# Patient Record
Sex: Female | Born: 1950 | Race: White | Hispanic: No | Marital: Single | Smoking: Never smoker
Health system: Southern US, Community
[De-identification: ages and names within clinical notes are randomized; demographics above are authoritative.]

## PROBLEM LIST (undated history)

## (undated) DIAGNOSIS — IMO0002 Reserved for concepts with insufficient information to code with codable children: Secondary | ICD-10-CM

## (undated) DIAGNOSIS — J019 Acute sinusitis, unspecified: Secondary | ICD-10-CM

## (undated) DIAGNOSIS — M549 Dorsalgia, unspecified: Secondary | ICD-10-CM

## (undated) DIAGNOSIS — R110 Nausea: Secondary | ICD-10-CM

## (undated) DIAGNOSIS — M25572 Pain in left ankle and joints of left foot: Principal | ICD-10-CM

## (undated) DIAGNOSIS — I10 Essential (primary) hypertension: Secondary | ICD-10-CM

## (undated) DIAGNOSIS — M898X9 Other specified disorders of bone, unspecified site: Principal | ICD-10-CM

## (undated) DIAGNOSIS — E785 Hyperlipidemia, unspecified: Secondary | ICD-10-CM

## (undated) DIAGNOSIS — R5382 Chronic fatigue, unspecified: Secondary | ICD-10-CM

## (undated) DIAGNOSIS — R609 Edema, unspecified: Secondary | ICD-10-CM

## (undated) DIAGNOSIS — N189 Chronic kidney disease, unspecified: Secondary | ICD-10-CM

## (undated) DIAGNOSIS — R300 Dysuria: Secondary | ICD-10-CM

## (undated) HISTORY — DX: Dysuria: R30.0

## (undated) HISTORY — DX: Hyperlipidemia, unspecified: E78.5

## (undated) HISTORY — PX: GANGLION CYST EXCISION: SHX1691

## (undated) HISTORY — PX: LAPAROSCOPIC OVARIAN CYSTECTOMY: SUR786

## (undated) HISTORY — DX: Edema, unspecified: R60.9

## (undated) HISTORY — PX: COLONOSCOPY W/ POLYPECTOMY: SHX1380

## (undated) HISTORY — DX: Other specified disorders of bone, unspecified site: M89.8X9

## (undated) HISTORY — DX: Pain in left ankle and joints of left foot: M25.572

## (undated) HISTORY — DX: Chronic kidney disease, unspecified: N18.9

## (undated) HISTORY — DX: Dorsalgia, unspecified: M54.9

## (undated) HISTORY — DX: Reserved for concepts with insufficient information to code with codable children: IMO0002

## (undated) HISTORY — DX: Acute sinusitis, unspecified: J01.90

## (undated) HISTORY — DX: Essential (primary) hypertension: I10

## (undated) HISTORY — PX: LIPOMA EXCISION: SHX5283

## (undated) HISTORY — DX: Chronic fatigue, unspecified: R53.82

## (undated) HISTORY — DX: Nausea: R11.0

## (undated) HISTORY — PX: ABLATION ON ENDOMETRIOSIS: SHX5787

---

## 1998-05-13 ENCOUNTER — Other Ambulatory Visit: Admission: RE | Admit: 1998-05-13 | Discharge: 1998-05-13 | Payer: Self-pay | Admitting: Obstetrics and Gynecology

## 2001-05-17 ENCOUNTER — Ambulatory Visit (HOSPITAL_COMMUNITY): Admission: RE | Admit: 2001-05-17 | Discharge: 2001-05-17 | Payer: Self-pay | Admitting: Gastroenterology

## 2001-06-14 ENCOUNTER — Other Ambulatory Visit: Admission: RE | Admit: 2001-06-14 | Discharge: 2001-06-14 | Payer: Self-pay | Admitting: Orthopedic Surgery

## 2002-07-31 ENCOUNTER — Emergency Department (HOSPITAL_COMMUNITY): Admission: EM | Admit: 2002-07-31 | Discharge: 2002-08-01 | Payer: Self-pay | Admitting: Emergency Medicine

## 2002-08-01 ENCOUNTER — Encounter: Payer: Self-pay | Admitting: Emergency Medicine

## 2002-10-02 ENCOUNTER — Encounter: Admission: RE | Admit: 2002-10-02 | Discharge: 2002-10-02 | Payer: Self-pay | Admitting: Family Medicine

## 2002-10-02 ENCOUNTER — Encounter: Payer: Self-pay | Admitting: Family Medicine

## 2002-12-18 ENCOUNTER — Encounter: Admission: RE | Admit: 2002-12-18 | Discharge: 2002-12-18 | Payer: Self-pay

## 2003-01-30 ENCOUNTER — Encounter (INDEPENDENT_AMBULATORY_CARE_PROVIDER_SITE_OTHER): Payer: Self-pay

## 2003-01-30 ENCOUNTER — Ambulatory Visit (HOSPITAL_COMMUNITY): Admission: RE | Admit: 2003-01-30 | Discharge: 2003-01-30 | Payer: Self-pay

## 2003-05-08 ENCOUNTER — Encounter: Admission: RE | Admit: 2003-05-08 | Discharge: 2003-05-08 | Payer: Self-pay

## 2003-09-07 ENCOUNTER — Encounter: Payer: Self-pay | Admitting: Family Medicine

## 2003-09-07 ENCOUNTER — Encounter: Admission: RE | Admit: 2003-09-07 | Discharge: 2003-09-07 | Payer: Self-pay | Admitting: Family Medicine

## 2005-05-16 ENCOUNTER — Other Ambulatory Visit: Admission: RE | Admit: 2005-05-16 | Discharge: 2005-05-16 | Payer: Self-pay | Admitting: Family Medicine

## 2006-07-26 ENCOUNTER — Other Ambulatory Visit: Admission: RE | Admit: 2006-07-26 | Discharge: 2006-07-26 | Payer: Self-pay | Admitting: Family Medicine

## 2007-10-15 ENCOUNTER — Encounter: Admission: RE | Admit: 2007-10-15 | Discharge: 2007-10-15 | Payer: Self-pay | Admitting: Allergy and Immunology

## 2008-04-29 ENCOUNTER — Other Ambulatory Visit: Admission: RE | Admit: 2008-04-29 | Discharge: 2008-04-29 | Payer: Self-pay | Admitting: Obstetrics and Gynecology

## 2008-08-12 ENCOUNTER — Encounter: Admission: RE | Admit: 2008-08-12 | Discharge: 2008-08-12 | Payer: Self-pay | Admitting: Otolaryngology

## 2009-09-29 ENCOUNTER — Encounter: Admission: RE | Admit: 2009-09-29 | Discharge: 2009-09-29 | Payer: Self-pay | Admitting: Family Medicine

## 2011-01-26 ENCOUNTER — Other Ambulatory Visit: Payer: Self-pay | Admitting: Dermatology

## 2011-04-21 NOTE — Op Note (Signed)
Marissa Parks, Marissa Parks                          ACCOUNT NO.:  [id number]   MEDICAL RECORD NO.:  [id number]                   PATIENT TYPE:  AMB   LOCATION:  SDC                                  FACILITY:  [facility]   PHYSICIAN:  Ronda Fairly. Galen Daft, M.D.              DATE OF BIRTH:  15-Jul-1951   DATE OF PROCEDURE:  01/30/2003  DATE OF DISCHARGE:                                 OPERATIVE REPORT   PREOPERATIVE DIAGNOSIS:  Bilateral ovarian neoplasm, uncertain behavior,  uncertain etiology.   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   PROCEDURE:  Resection of portions of left and right ovary with removal of  endometriomas via laparoscopy.   SURGEON:  Ronda Fairly. Galen Daft, M.D.   ANESTHESIA:  General anesthesia.   COMPLICATIONS:  None.   ESTIMATED BLOOD LOSS:  Less than 10 cc.   SPECIMENS:  Portions of left and right ovarian tissue.   DESCRIPTION OF PROCEDURE:  The patient was identified and informed consent  had been obtained and we reviewed this prior to the surgery.  We reviewed  her preoperative labs and everything was okay. She went to the operating  room. General anesthesia, Betadine prep, sterile technique, preoperative  antibiotics, sequential compression boots.  The umbilical incision was  utilized for the first trocar after inflation with carbon dioxide gas  through a Veress needle. Care was taken to ascertain correct placement of  the Veress needle.  A 5 mm trocar was used in the umbilical site and a  secondary 5 mm site was utilized and a tertiary 5 mm site was utilized under  direct visualization in the left and right flank, respectively. These  allowed for complete inspection of the ovaries and surrounding tissues.  The  upper abdomen was unremarkable.  The liver was free of adhesions.  There  were no significant bowel adhesions.  The lower pelvis showed evidence of  endometriosis on the ovaries and there was a chocolate  filled cyst on both  right and left ovary.  These were removed along with ovarian tissue.  The  ovaries were left in place otherwise.  The tubes were left in place.  There  was no significant pelvic endometriosis.  The cul-de-sac was not  obliterated, etc.  There was some scarring in the cul-de-sac but very  minimal.  The anterior cul-de-sac similarly was unremarkable.  There were  some small uterine fibroids which were incidental.  The areas were  completely hemostatic after the portions were removed and cautery was  utilized where necessary.  These areas were removed with a combination of  biopsy forceps, sharp scissor dissection with electrocautery and bipolar  cautery. Care was taken to avoid all adjacent structures.  There was no  injury.  The tissues were all hemostatic.  The carbon dioxide was deflated.  Again, inspection was shown to be complete for hemostasis.  All sponge,  needle and instrument counts were correct after the instruments were removed  and the skin was closed with 3-0 Monocryl closure.  No complications.  The  patient tolerated the procedure well and left the operating room in stable  condition.                                               Ronda Fairly. Galen Daft, M.D.   NJT/MEDQ  D:  01/30/2003  T:  01/30/2003  Job:  [id number]

## 2011-12-13 ENCOUNTER — Other Ambulatory Visit: Payer: Self-pay | Admitting: Family Medicine

## 2011-12-13 DIAGNOSIS — R9389 Abnormal findings on diagnostic imaging of other specified body structures: Secondary | ICD-10-CM

## 2011-12-13 DIAGNOSIS — R0789 Other chest pain: Secondary | ICD-10-CM

## 2011-12-15 ENCOUNTER — Ambulatory Visit
Admission: RE | Admit: 2011-12-15 | Discharge: 2011-12-15 | Disposition: A | Discharge status: Home / Self Care | Payer: BC Managed Care – PPO | Source: Ambulatory Visit | Attending: Family Medicine | Admitting: Family Medicine

## 2011-12-15 DIAGNOSIS — R0789 Other chest pain: Secondary | ICD-10-CM

## 2011-12-15 DIAGNOSIS — R9389 Abnormal findings on diagnostic imaging of other specified body structures: Secondary | ICD-10-CM

## 2013-02-04 ENCOUNTER — Other Ambulatory Visit: Payer: Self-pay | Admitting: Family Medicine

## 2013-02-04 DIAGNOSIS — R109 Unspecified abdominal pain: Secondary | ICD-10-CM

## 2013-02-05 ENCOUNTER — Ambulatory Visit
Admission: RE | Admit: 2013-02-05 | Discharge: 2013-02-05 | Disposition: A | Discharge status: Home / Self Care | Payer: BC Managed Care – PPO | Source: Ambulatory Visit | Attending: Family Medicine | Admitting: Family Medicine

## 2013-02-05 DIAGNOSIS — R109 Unspecified abdominal pain: Secondary | ICD-10-CM

## 2013-02-06 ENCOUNTER — Other Ambulatory Visit (HOSPITAL_COMMUNITY): Payer: Self-pay | Admitting: Family Medicine

## 2013-02-06 DIAGNOSIS — R109 Unspecified abdominal pain: Secondary | ICD-10-CM

## 2013-02-18 ENCOUNTER — Encounter (HOSPITAL_COMMUNITY)
Admission: RE | Admit: 2013-02-18 | Discharge: 2013-02-18 | Disposition: A | Discharge status: Home / Self Care | Payer: BC Managed Care – PPO | Source: Ambulatory Visit | Attending: Family Medicine | Admitting: Family Medicine

## 2013-02-18 DIAGNOSIS — R109 Unspecified abdominal pain: Secondary | ICD-10-CM | POA: Insufficient documentation

## 2013-02-18 MED ORDER — TECHNETIUM TC 99M MEBROFENIN IV KIT
5.0000 | PACK | Freq: Once | INTRAVENOUS | Status: AC | PRN
  Administered 2013-02-18: 5 via INTRAVENOUS

## 2014-02-11 ENCOUNTER — Other Ambulatory Visit: Payer: Self-pay | Admitting: Family Medicine

## 2014-02-11 DIAGNOSIS — R935 Abnormal findings on diagnostic imaging of other abdominal regions, including retroperitoneum: Secondary | ICD-10-CM

## 2014-02-12 ENCOUNTER — Ambulatory Visit
Admission: RE | Admit: 2014-02-12 | Discharge: 2014-02-12 | Disposition: A | Discharge status: Home / Self Care | Payer: BC Managed Care – PPO | Source: Ambulatory Visit | Attending: Family Medicine | Admitting: Family Medicine

## 2014-02-12 DIAGNOSIS — R935 Abnormal findings on diagnostic imaging of other abdominal regions, including retroperitoneum: Secondary | ICD-10-CM

## 2014-05-07 ENCOUNTER — Ambulatory Visit (INDEPENDENT_AMBULATORY_CARE_PROVIDER_SITE_OTHER): Payer: BC Managed Care – PPO | Admitting: Podiatry

## 2014-05-07 ENCOUNTER — Encounter: Payer: Self-pay | Admitting: Podiatry

## 2014-05-07 VITALS — BP 122/69 | HR 91 | Resp 16

## 2014-05-07 DIAGNOSIS — L84 Corns and callosities: Secondary | ICD-10-CM

## 2014-05-08 NOTE — Progress Notes (Signed)
Subjective:     Patient ID: Marissa Parks, female   DOB: 1951-05-30, 63 y.o.   MRN: [id number]  HPI patient is found to have callus formation both feet it's painful   Review of Systems     Objective:   Physical Exam Neurovascular status intact with keratotic lesions plantar aspect both feet that are painful when pressed    Assessment:     Callus formation with porokeratosis probably part of the problem    Plan:     Debridement painful lesions both feet with no bleeding noted

## 2014-06-04 ENCOUNTER — Other Ambulatory Visit: Payer: Self-pay | Admitting: Family Medicine

## 2014-06-04 ENCOUNTER — Ambulatory Visit
Admission: RE | Admit: 2014-06-04 | Discharge: 2014-06-04 | Disposition: A | Discharge status: Home / Self Care | Payer: BC Managed Care – PPO | Source: Ambulatory Visit | Attending: Family Medicine | Admitting: Family Medicine

## 2014-06-04 DIAGNOSIS — M5489 Other dorsalgia: Secondary | ICD-10-CM

## 2014-06-04 DIAGNOSIS — R0781 Pleurodynia: Secondary | ICD-10-CM

## 2014-06-30 ENCOUNTER — Other Ambulatory Visit: Payer: Self-pay | Admitting: Neurosurgery

## 2014-06-30 DIAGNOSIS — IMO0002 Reserved for concepts with insufficient information to code with codable children: Secondary | ICD-10-CM

## 2014-07-07 ENCOUNTER — Telehealth: Payer: Self-pay | Admitting: Hematology and Oncology

## 2014-07-07 NOTE — Telephone Encounter (Signed)
S/W PATIENT AND GAVE NP APPT FOR 08/05 @ 8:15 W/DR. [place].

## 2014-07-08 ENCOUNTER — Encounter: Payer: Self-pay | Admitting: Hematology and Oncology

## 2014-07-08 ENCOUNTER — Ambulatory Visit: Payer: BC Managed Care – PPO

## 2014-07-08 ENCOUNTER — Inpatient Hospital Stay (HOSPITAL_COMMUNITY)
Admission: AD | Admit: 2014-07-08 | Discharge: 2014-07-10 | DRG: 841 | Disposition: A | Discharge status: Home / Self Care | Payer: BC Managed Care – PPO | Source: Ambulatory Visit | Attending: Hematology and Oncology | Admitting: Hematology and Oncology

## 2014-07-08 ENCOUNTER — Encounter: Payer: BC Managed Care – PPO | Admitting: Hematology and Oncology

## 2014-07-08 ENCOUNTER — Encounter (HOSPITAL_COMMUNITY): Payer: Self-pay

## 2014-07-08 ENCOUNTER — Encounter (INDEPENDENT_AMBULATORY_CARE_PROVIDER_SITE_OTHER): Payer: Self-pay

## 2014-07-08 DIAGNOSIS — Z801 Family history of malignant neoplasm of trachea, bronchus and lung: Secondary | ICD-10-CM

## 2014-07-08 DIAGNOSIS — M8448XA Pathological fracture, other site, initial encounter for fracture: Secondary | ICD-10-CM | POA: Diagnosis present

## 2014-07-08 DIAGNOSIS — N189 Chronic kidney disease, unspecified: Secondary | ICD-10-CM | POA: Diagnosis present

## 2014-07-08 DIAGNOSIS — E860 Dehydration: Secondary | ICD-10-CM

## 2014-07-08 DIAGNOSIS — C9000 Multiple myeloma not having achieved remission: Principal | ICD-10-CM | POA: Diagnosis present

## 2014-07-08 DIAGNOSIS — R319 Hematuria, unspecified: Secondary | ICD-10-CM | POA: Diagnosis present

## 2014-07-08 DIAGNOSIS — R634 Abnormal weight loss: Secondary | ICD-10-CM | POA: Diagnosis present

## 2014-07-08 DIAGNOSIS — IMO0002 Reserved for concepts with insufficient information to code with codable children: Secondary | ICD-10-CM

## 2014-07-08 DIAGNOSIS — N179 Acute kidney failure, unspecified: Secondary | ICD-10-CM | POA: Diagnosis present

## 2014-07-08 DIAGNOSIS — K5900 Constipation, unspecified: Secondary | ICD-10-CM | POA: Diagnosis present

## 2014-07-08 DIAGNOSIS — D630 Anemia in neoplastic disease: Secondary | ICD-10-CM | POA: Diagnosis present

## 2014-07-08 DIAGNOSIS — M898X9 Other specified disorders of bone, unspecified site: Secondary | ICD-10-CM

## 2014-07-08 DIAGNOSIS — E785 Hyperlipidemia, unspecified: Secondary | ICD-10-CM | POA: Diagnosis present

## 2014-07-08 DIAGNOSIS — I129 Hypertensive chronic kidney disease with stage 1 through stage 4 chronic kidney disease, or unspecified chronic kidney disease: Secondary | ICD-10-CM | POA: Diagnosis present

## 2014-07-08 DIAGNOSIS — N3001 Acute cystitis with hematuria: Secondary | ICD-10-CM

## 2014-07-08 LAB — CBC WITH DIFFERENTIAL/PLATELET
BASOS PCT: 1 % (ref 0–1)
Basophils Absolute: 0.1 10*3/uL (ref 0.0–0.1)
EOS ABS: 0.1 10*3/uL (ref 0.0–0.7)
Eosinophils Relative: 1 % (ref 0–5)
HCT: 25 % — ABNORMAL LOW (ref 36.0–46.0)
HEMOGLOBIN: 8.3 g/dL — AB (ref 12.0–15.0)
LYMPHS PCT: 17 % (ref 12–46)
Lymphs Abs: 0.9 10*3/uL (ref 0.7–4.0)
MCH: 32 pg (ref 26.0–34.0)
MCHC: 33.2 g/dL (ref 30.0–36.0)
MCV: 96.5 fL (ref 78.0–100.0)
MONOS PCT: 11 % (ref 3–12)
Monocytes Absolute: 0.6 10*3/uL (ref 0.1–1.0)
NEUTROS PCT: 70 % (ref 43–77)
Neutro Abs: 3.8 10*3/uL (ref 1.7–7.7)
Platelets: 199 10*3/uL (ref 150–400)
RBC: 2.59 MIL/uL — AB (ref 3.87–5.11)
RDW: 13.1 % (ref 11.5–15.5)
WBC: 5.5 10*3/uL (ref 4.0–10.5)

## 2014-07-08 LAB — COMPREHENSIVE METABOLIC PANEL
ALT: 16 U/L (ref 0–35)
ANION GAP: 17 — AB (ref 5–15)
AST: 17 U/L (ref 0–37)
Albumin: 3.6 g/dL (ref 3.5–5.2)
Alkaline Phosphatase: 86 U/L (ref 39–117)
BILIRUBIN TOTAL: 0.2 mg/dL — AB (ref 0.3–1.2)
BUN: 33 mg/dL — AB (ref 6–23)
CALCIUM: 13.1 mg/dL — AB (ref 8.4–10.5)
CO2: 24 mEq/L (ref 19–32)
CREATININE: 2.83 mg/dL — AB (ref 0.50–1.10)
Chloride: 97 mEq/L (ref 96–112)
GFR calc Af Amer: 20 mL/min — ABNORMAL LOW (ref >90)
GFR calc non Af Amer: 17 mL/min — ABNORMAL LOW (ref >90)
Glucose, Bld: 93 mg/dL (ref 70–99)
Potassium: 3.5 mEq/L — ABNORMAL LOW (ref 3.7–5.3)
Sodium: 138 mEq/L (ref 137–147)
TOTAL PROTEIN: 8.8 g/dL — AB (ref 6.0–8.3)

## 2014-07-08 LAB — LACTATE DEHYDROGENASE: LDH: 283 U/L — AB (ref 94–250)

## 2014-07-08 LAB — URIC ACID: Uric Acid, Serum: 10.4 mg/dL — ABNORMAL HIGH (ref 2.4–7.0)

## 2014-07-08 LAB — BONE MARROW EXAM

## 2014-07-08 LAB — PHOSPHORUS: PHOSPHORUS: 2.9 mg/dL (ref 2.3–4.6)

## 2014-07-08 MED ORDER — ACYCLOVIR 400 MG PO TABS
400.0000 mg | ORAL_TABLET | Freq: Every day | ORAL | Status: DC
  Administered 2014-07-08 – 2014-07-10 (×3): 400 mg via ORAL
  Filled 2014-07-08 (×3): qty 1

## 2014-07-08 MED ORDER — DEXAMETHASONE 6 MG PO TABS
40.0000 mg | ORAL_TABLET | Freq: Once | ORAL | Status: AC
  Administered 2014-07-08: 40 mg via ORAL
  Filled 2014-07-08: qty 1

## 2014-07-08 MED ORDER — ONDANSETRON 8 MG/NS 50 ML IVPB
8.0000 mg | Freq: Three times a day (TID) | INTRAVENOUS | Status: DC | PRN
  Filled 2014-07-08: qty 8

## 2014-07-08 MED ORDER — ONDANSETRON HCL 4 MG/2ML IJ SOLN: 4.0000 mg | Freq: Three times a day (TID) | INTRAMUSCULAR | Status: DC | PRN

## 2014-07-08 MED ORDER — BORTEZOMIB CHEMO SQ INJECTION 3.5 MG (2.5MG/ML)
1.3000 mg/m2 | Freq: Once | INTRAMUSCULAR | Status: AC
  Administered 2014-07-08: 1.75 mg via SUBCUTANEOUS
  Filled 2014-07-08: qty 0.7

## 2014-07-08 MED ORDER — SODIUM CHLORIDE 0.9 % IV SOLN
3.0000 mg | Freq: Once | INTRAVENOUS | Status: AC
  Administered 2014-07-08: 3 mg via INTRAVENOUS
  Filled 2014-07-08: qty 2

## 2014-07-08 MED ORDER — ONDANSETRON HCL 4 MG PO TABS: 4.0000 mg | ORAL_TABLET | Freq: Three times a day (TID) | ORAL | Status: DC | PRN

## 2014-07-08 MED ORDER — ALUM & MAG HYDROXIDE-SIMETH 200-200-20 MG/5ML PO SUSP
60.0000 mL | ORAL | Status: DC | PRN
  Administered 2014-07-09: 60 mL via ORAL
  Filled 2014-07-08: qty 60

## 2014-07-08 MED ORDER — HYDROMORPHONE HCL PF 1 MG/ML IJ SOLN
1.0000 mg | INTRAMUSCULAR | Status: DC | PRN
Start: 2014-07-08 — End: 2014-07-10
  Administered 2014-07-08 – 2014-07-10 (×6): 1 mg via INTRAVENOUS
  Filled 2014-07-08 (×7): qty 1

## 2014-07-08 MED ORDER — ONDANSETRON HCL 8 MG PO TABS
8.0000 mg | ORAL_TABLET | Freq: Once | ORAL | Status: AC
  Administered 2014-07-08: 8 mg via ORAL
  Filled 2014-07-08: qty 1

## 2014-07-08 MED ORDER — POLYVINYL ALCOHOL 1.4 % OP SOLN
1.0000 [drp] | Freq: Two times a day (BID) | OPHTHALMIC | Status: DC | PRN
  Filled 2014-07-08: qty 15

## 2014-07-08 MED ORDER — TRAMADOL HCL 50 MG PO TABS
50.0000 mg | ORAL_TABLET | Freq: Two times a day (BID) | ORAL | Status: DC | PRN
  Administered 2014-07-10: 50 mg via ORAL
  Filled 2014-07-08: qty 1

## 2014-07-08 MED ORDER — HEPARIN SODIUM (PORCINE) 5000 UNIT/ML IJ SOLN
5000.0000 [IU] | Freq: Three times a day (TID) | INTRAMUSCULAR | Status: DC
  Administered 2014-07-08 – 2014-07-10 (×6): 5000 [IU] via SUBCUTANEOUS
  Filled 2014-07-08 (×9): qty 1

## 2014-07-08 MED ORDER — PANTOPRAZOLE SODIUM 40 MG PO TBEC
40.0000 mg | DELAYED_RELEASE_TABLET | Freq: Every day | ORAL | Status: DC
  Administered 2014-07-09 – 2014-07-10 (×2): 40 mg via ORAL
  Filled 2014-07-08 (×2): qty 1

## 2014-07-08 MED ORDER — VITAMIN D3 25 MCG (1000 UNIT) PO TABS
2000.0000 [IU] | ORAL_TABLET | Freq: Every morning | ORAL | Status: DC
  Administered 2014-07-09 – 2014-07-10 (×2): 2000 [IU] via ORAL
  Filled 2014-07-08 (×2): qty 2

## 2014-07-08 MED ORDER — SODIUM CHLORIDE 0.9 % IV SOLN
Freq: Once | INTRAVENOUS | Status: AC
  Administered 2014-07-08: 11:00:00 via INTRAVENOUS

## 2014-07-08 MED ORDER — VITAMINS A & D EX OINT
TOPICAL_OINTMENT | CUTANEOUS | Status: AC
  Administered 2014-07-08: 21:00:00
  Filled 2014-07-08: qty 5

## 2014-07-08 MED ORDER — ACETAMINOPHEN 325 MG PO TABS: 650.0000 mg | ORAL_TABLET | ORAL | Status: DC | PRN

## 2014-07-08 MED ORDER — SENNOSIDES-DOCUSATE SODIUM 8.6-50 MG PO TABS
2.0000 | ORAL_TABLET | Freq: Two times a day (BID) | ORAL | Status: DC
  Administered 2014-07-08 – 2014-07-10 (×3): 2 via ORAL
  Filled 2014-07-08 (×5): qty 2

## 2014-07-08 MED ORDER — SODIUM CHLORIDE 0.9 % IV SOLN
INTRAVENOUS | Status: DC
  Administered 2014-07-08 – 2014-07-10 (×4): via INTRAVENOUS

## 2014-07-08 MED ORDER — ONDANSETRON 8 MG PO TBDP: 4.0000 mg | ORAL_TABLET | Freq: Three times a day (TID) | ORAL | Status: DC | PRN

## 2014-07-08 NOTE — Progress Notes (Signed)
Checked in new pt with no financial concerns. °

## 2014-07-08 NOTE — H&P (Signed)
[facility] CONSULT NOTE & Reason fr admission  Patient Care Team: Hulan Fess, MD as PCP - General (Family Medicine)  CHIEF COMPLAINTS/PURPOSE OF CONSULTATION:  This patient was directly admitted from the clinic after presenting with severe hypercalcemia, renal failure, severe anemia, bone pain and diagnosis of multiple myeloma.  HISTORY OF PRESENTING ILLNESS:  Marissa Parks 63 y.o. female was referred by a nephrologist. She presented with worsening renal failure, anemia, and so the vertebral fracture. According to the patient, she has worsening bone pain. She was referred to see neurosurgeon we'll plan for MRI today. Approximately 10 days ago, she was referred to see the nephrologist for renal dysfunction and blood work at that time revealed abnormal serum protein electrophoresis with over 3.5 g of IgA kappa secretion in the urine with serum M spike of 1.9 g. She was noted to be in renal failure with creatinine of 2.2 and severe hypercalcemia. She has severe mid thoracic bone pain. She is taking tramadol. She has severe constipation recently. Denies any nausea. She has very poor appetite and had lost some weight. Patient denies history of recurrent infection or atypical infections such as shingles of meningitis. Denies chills, night sweats, anorexia or abnormal weight loss.  MEDICAL HISTORY:  Past Medical History  Diagnosis Date  . Hypertension   . Chronic kidney disease   . Hyperlipidemia   . Back pain   . Fracture of vertebra     SURGICAL HISTORY: Past Surgical History  Procedure Laterality Date  . Lipoma excision    . Ganglion cyst excision    . Laparoscopic ovarian cystectomy    . Ablation on endometriosis    . Colonoscopy w/ polypectomy      SOCIAL HISTORY: History   Social History  . Marital Status: Single    Spouse Name: N/A    Number of Children: N/A  . Years of Education: N/A   Occupational History  . Not on file.   Social History Main  Topics  . Smoking status: Never Smoker   . Smokeless tobacco: Never Used  . Alcohol Use: No  . Drug Use: Not on file  . Sexual Activity: No   Other Topics Concern  . Not on file   Social History Narrative  . No narrative on file    FAMILY HISTORY: Family History  Problem Relation Age of Onset  . Cancer Maternal Aunt     breast and throat ca  . Cancer Paternal Aunt     ?ovarian or uterine ca  . Cancer Paternal Uncle     "early stage of leukemia"  . Cancer Paternal Grandmother     lung ca  . Cancer Paternal Grandfather     lung ca  . Cancer Paternal Uncle     colon ca, throat ca, skin ca  . Cancer Cousin     rare intestinal ca    ALLERGIES:  is allergic to ace inhibitors; hydrocodone; and cephalexin.  MEDICATIONS:  Current Facility-Administered Medications  Medication Dose Route Frequency Provider Last Rate Last Dose  . 0.9 %  sodium chloride infusion   Intravenous Continuous Heath Lark, MD 100 mL/hr at 07/08/14 1612    . acetaminophen (TYLENOL) tablet 650 mg  650 mg Oral Q4H PRN Heath Lark, MD      . acyclovir (ZOVIRAX) tablet 400 mg  400 mg Oral Daily Labrian Torregrossa, MD   400 mg at 07/08/14 1414  . alum & mag hydroxide-simeth (MAALOX/MYLANTA) 200-200-20 MG/5ML suspension 60 mL  60 mL Oral Q4H PRN Heath Lark, MD      . Derrill Memo ON 07/09/2014] cholecalciferol (VITAMIN D) tablet 2,000 Units  2,000 Units Oral q morning - 10a Pearlina Friedly, MD      . heparin injection 5,000 Units  5,000 Units Subcutaneous 3 times per day Heath Lark, MD   5,000 Units at 07/08/14 2106  . HYDROmorphone (DILAUDID) injection 1 mg  1 mg Intravenous Q2H PRN Heath Lark, MD   1 mg at 07/08/14 1507  . ondansetron (ZOFRAN) tablet 4-8 mg  4-8 mg Oral Q8H PRN Heath Lark, MD       Or  . ondansetron (ZOFRAN-ODT) disintegrating tablet 4-8 mg  4-8 mg Oral Q8H PRN Heath Lark, MD       Or  . ondansetron (ZOFRAN) injection 4 mg  4 mg Intravenous Q8H PRN Heath Lark, MD       Or  . ondansetron (ZOFRAN) 8 mg/NS 50 ml  IVPB  8 mg Intravenous Q8H PRN Heath Lark, MD      . Derrill Memo ON 07/09/2014] pantoprazole (PROTONIX) EC tablet 40 mg  40 mg Oral Daily Lazette Estala, MD      . polyvinyl alcohol (LIQUIFILM TEARS) 1.4 % ophthalmic solution 1-2 drop  1-2 drop Both Eyes BID PRN Heath Lark, MD      . senna-docusate (Senokot-S) tablet 2 tablet  2 tablet Oral BID Heath Lark, MD   2 tablet at 07/08/14 2106  . traMADol (ULTRAM) tablet 50 mg  50 mg Oral Q12H PRN Heath Lark, MD        REVIEW OF SYSTEMS:   Eyes: Denies blurriness of vision, double vision or watery eyes Ears, nose, mouth, throat, and face: Denies mucositis or sore throat Respiratory: Denies cough, dyspnea or wheezes Cardiovascular: Denies palpitation, chest discomfort or lower extremity swelling Skin: Denies abnormal skin rashes Lymphatics: Denies new lymphadenopathy or easy bruising Neurological:Denies numbness, tingling or new weaknesses Behavioral/Psych: Mood is stable, no new changes  All other systems were reviewed with the patient and are negative.  PHYSICAL EXAMINATION: ECOG PERFORMANCE STATUS: 1 - Symptomatic but completely ambulatory  Filed Vitals:   07/08/14 2016  BP: 153/75  Pulse: 81  Temp: 98.6 F (37 C)  Resp: 16   Filed Weights   07/08/14 1013  Weight: 101 lb 4.8 oz (45.949 kg)    GENERAL:alert, no distress and comfortable. She looks thin and cachectic SKIN: skin color, texture, turgor are normal, no rashes or significant lesions EYES: normal, conjunctiva are pink and non-injected, sclera clear OROPHARYNX:no exudate, no erythema and lips, buccal mucosa, and tongue normal. she has dry mucous membranes  NECK: supple, thyroid normal size, non-tender, without nodularity LYMPH:  no palpable lymphadenopathy in the cervical, axillary or inguinal LUNGS: clear to auscultation and percussion with normal breathing effort HEART: regular rate & rhythm and no murmurs and no lower extremity edema ABDOMEN:abdomen soft, non-tender and normal  bowel sounds Musculoskeletal:no cyanosis of digits and no clubbing uterus she has bone pain on palpation PSYCH: alert & oriented x 3 with fluent speech NEURO: no focal motor/sensory deficits  LABORATORY DATA:  I have reviewed the data as listed Lab Results  Component Value Date   WBC 5.5 07/08/2014   HGB 8.3* 07/08/2014   HCT 25.0* 07/08/2014   MCV 96.5 07/08/2014   PLT 199 07/08/2014   ASSESSMENT & PLAN:  #1 multiple myeloma She needs to be admitted for origin treatment due to significant progression of her disease. The risk of not admitting  her to start treatment would the risk of death. Please see separate procedure note for bone marrow aspirate and biopsy. I have redrawn and another baseline blood work. We discussed the role of chemotherapy. The intent is for palliative.  We discussed some of the risks, benefits, side-effects of Velcade & dexamethasone.   Some of the short term side-effects included, though not limited to, risk of fatigue, weight loss, pancytopenia, life-threatening infections, skin rash, allergic reactions, need for transfusions of blood products, reactivation of viral infections, nausea, vomiting, change in bowel habits, admission to hospital for various reasons, and risks of death.   Long term side-effects are also discussed including risks of infertility, permanent damage to nerve function, chronic fatigue.   The patient is aware that the response rates discussed earlier is not guaranteed.    After a long discussion, patient made an informed decision to proceed with the prescribed plan of care and went ahead to sign the consent form today.   Patient education material was dispensed.  #2 severe hypercalcemia EKG showed no significant EKG abnormalities. I recommend aggressive IV fluid resuscitation, intravenous dexamethasone and to start her on chemotherapy as soon as possible. I will consider forced diuresis depending on her calcium level tomorrow. IV pamidronate  is contraindicated due to renal dysfunction.  #3 acute renal failure the 90s due to multiple myeloma. Hopefully, this is reversible with origin treatment. She will continue on aggressive IV fluid resuscitation.  #4 anemia This is related to multiple myeloma. She is not symptomatic. I would transfuse if her hemoglobin dropped to less than 7 g.  #5 DVT prophylaxis I will use heparin subcutaneous  #6 CODE STATUS Full code  #7 constipation I will continue on laxative regularly. This is related to dehydration and severe hypercalcemia  #8 bone pain and vertebral fracture I will increase her pain medication. I will order a skeletal survey for tomorrow. I will consider MRI scan of the thoracic spine depending on the results of the skeletal survey.  #9 hyperuricemia She is at high-risk of tumor lysis syndrome. I will start her on her Rasburicase. I will hold off starting her on allopurinol for now due to severe renal dysfunction   All questions were answered. The patient knows to call the clinic with any problems, questions or concerns.    Jubilee Vivero, MD _0 @ 9:34 PM

## 2014-07-08 NOTE — Procedures (Signed)
Bone Marrow Biopsy and Aspiration Procedure Note   Informed consent was obtained and potential risks including bleeding, infection and pain were reviewed with the patient. The patient's name, date of birth, identification, consent and allergies were verified prior to the start of procedure and time out was performed.  The left posterior iliac crest was chosen as the site of biopsy.  The skin was prepped with Betadine solution.   8 cc of 1% lidocaine was used to provide local anaesthesia.   10 cc of bone marrow aspirate was obtained followed by 1 inch biopsy.   The procedure was tolerated well and there were no complications.  The patient was stable at the end of the procedure.  Specimens sent for flow cytometry, cytogenetics and additional studies.   

## 2014-07-08 NOTE — Progress Notes (Signed)
Patient received from [facility], patient oriented to room, vital signs obtained. Awaiting orders to be placed. Will continue to monitor.

## 2014-07-08 NOTE — Progress Notes (Signed)
Please see admission H&P This encounter was created in error - please disregard.

## 2014-07-09 ENCOUNTER — Inpatient Hospital Stay (HOSPITAL_COMMUNITY): Payer: BC Managed Care – PPO

## 2014-07-09 DIAGNOSIS — N19 Unspecified kidney failure: Secondary | ICD-10-CM

## 2014-07-09 DIAGNOSIS — M949 Disorder of cartilage, unspecified: Secondary | ICD-10-CM

## 2014-07-09 DIAGNOSIS — C9000 Multiple myeloma not having achieved remission: Principal | ICD-10-CM

## 2014-07-09 DIAGNOSIS — R7989 Other specified abnormal findings of blood chemistry: Secondary | ICD-10-CM

## 2014-07-09 DIAGNOSIS — K5900 Constipation, unspecified: Secondary | ICD-10-CM

## 2014-07-09 DIAGNOSIS — D630 Anemia in neoplastic disease: Secondary | ICD-10-CM

## 2014-07-09 DIAGNOSIS — M899 Disorder of bone, unspecified: Secondary | ICD-10-CM

## 2014-07-09 LAB — CBC WITH DIFFERENTIAL/PLATELET
Basophils Absolute: 0 10*3/uL (ref 0.0–0.1)
Basophils Relative: 0 % (ref 0–1)
Eosinophils Absolute: 0 10*3/uL (ref 0.0–0.7)
Eosinophils Relative: 0 % (ref 0–5)
HCT: 21.9 % — ABNORMAL LOW (ref 36.0–46.0)
HEMOGLOBIN: 7.4 g/dL — AB (ref 12.0–15.0)
Lymphocytes Relative: 11 % — ABNORMAL LOW (ref 12–46)
Lymphs Abs: 0.7 10*3/uL (ref 0.7–4.0)
MCH: 32.7 pg (ref 26.0–34.0)
MCHC: 33.8 g/dL (ref 30.0–36.0)
MCV: 96.9 fL (ref 78.0–100.0)
MONOS PCT: 5 % (ref 3–12)
Monocytes Absolute: 0.3 10*3/uL (ref 0.1–1.0)
NEUTROS ABS: 5.4 10*3/uL (ref 1.7–7.7)
Neutrophils Relative %: 84 % — ABNORMAL HIGH (ref 43–77)
Platelets: 166 10*3/uL (ref 150–400)
RBC: 2.26 MIL/uL — AB (ref 3.87–5.11)
RDW: 13.4 % (ref 11.5–15.5)
WBC: 6.3 10*3/uL (ref 4.0–10.5)

## 2014-07-09 LAB — COMPREHENSIVE METABOLIC PANEL
ALT: 19 U/L (ref 0–35)
AST: 21 U/L (ref 0–37)
Albumin: 3.2 g/dL — ABNORMAL LOW (ref 3.5–5.2)
Alkaline Phosphatase: 82 U/L (ref 39–117)
Anion gap: 13 (ref 5–15)
BUN: 39 mg/dL — ABNORMAL HIGH (ref 6–23)
CHLORIDE: 103 meq/L (ref 96–112)
CO2: 23 meq/L (ref 19–32)
Calcium: 11.2 mg/dL — ABNORMAL HIGH (ref 8.4–10.5)
Creatinine, Ser: 2.78 mg/dL — ABNORMAL HIGH (ref 0.50–1.10)
GFR calc Af Amer: 20 mL/min — ABNORMAL LOW (ref >90)
GFR, EST NON AFRICAN AMERICAN: 17 mL/min — AB (ref >90)
Glucose, Bld: 124 mg/dL — ABNORMAL HIGH (ref 70–99)
POTASSIUM: 4.1 meq/L (ref 3.7–5.3)
SODIUM: 139 meq/L (ref 137–147)
Total Protein: 7.6 g/dL (ref 6.0–8.3)

## 2014-07-09 LAB — KAPPA/LAMBDA LIGHT CHAINS
Kappa free light chain: 1670 mg/dL — ABNORMAL HIGH (ref 0.33–1.94)
Lambda free light chains: 0.81 mg/dL (ref 0.57–2.63)

## 2014-07-09 LAB — LACTATE DEHYDROGENASE: LDH: 261 U/L — ABNORMAL HIGH (ref 94–250)

## 2014-07-09 LAB — ABO/RH: ABO/RH(D): A POS

## 2014-07-09 LAB — URIC ACID: Uric Acid, Serum: 0.9 mg/dL — ABNORMAL LOW (ref 2.4–7.0)

## 2014-07-09 LAB — PREPARE RBC (CROSSMATCH)

## 2014-07-09 MED ORDER — DEXAMETHASONE 6 MG PO TABS
12.0000 mg | ORAL_TABLET | Freq: Once | ORAL | Status: AC
  Administered 2014-07-09: 12 mg via ORAL
  Filled 2014-07-09: qty 2

## 2014-07-09 MED ORDER — SODIUM CHLORIDE 0.9 % IV SOLN
Freq: Once | INTRAVENOUS | Status: AC
  Administered 2014-07-09: 15:00:00 via INTRAVENOUS

## 2014-07-09 NOTE — Progress Notes (Signed)
Marissa Parks   DOB:1951/06/10   VX#:[id number]   QZR#:[id number]  Subjective: I have seen the patient, examined her and edited the notes as follows  Events since admission noted. Afebrile. She had a good night of sleep. Her pain is well controlled with meds. Denies any nausea or vomiting. Appetite is "better this morning". Denies shortness of breath  Or cardiac complaints. Denies chills, night sweats. No bowel movement today.  Scheduled Meds: . acyclovir  400 mg Oral Daily  . cholecalciferol  2,000 Units Oral q morning - 10a  . heparin  5,000 Units Subcutaneous 3 times per day  . pantoprazole  40 mg Oral Daily  . senna-docusate  2 tablet Oral BID   Continuous Infusions: . sodium chloride 100 mL/hr at 07/09/14 0032   PRN Meds:.acetaminophen, alum & mag hydroxide-simeth, HYDROmorphone (DILAUDID) injection, ondansetron (ZOFRAN) IV, ondansetron (ZOFRAN) IV, ondansetron, ondansetron, polyvinyl alcohol, traMADol   Objective:  Filed Vitals:   07/09/14 0532  BP: 151/79  Pulse: 75  Temp: 97.9 F (36.6 C)  Resp: 14      Intake/Output Summary (Last 24 hours) at 07/09/14 0631 Last data filed at 07/09/14 0100  Gross per 24 hour  Intake      0 ml  Output   1925 ml  Net  -1925 ml    ECOG PERFORMANCE STATUS:  1 Symptomatic but completely ambulatory (Restricted in physically strenuous activity but ambulatory and able to carry out work of a light or sedentary nature. For example, light housework, office work)   GENERAL:alert, no distress and comfortable. She looks thin and cachectic  SKIN: skin color is pale, texture, turgor are normal, no rashes or significant lesions  EYES: normal, conjunctiva are pale and non-injected, sclera clear  OROPHARYNX:no exudate, no erythema and lips, buccal mucosa, and tongue normal. she has dry mucous membranes  NECK: supple, thyroid normal size, non-tender, without nodularity  LYMPH: no palpable lymphadenopathy in the cervical, axillary or inguinal  LUNGS:  clear to auscultation and percussion with normal breathing effort  HEART: regular rate & rhythm and no murmurs and no lower extremity edema  ABDOMEN:abdomen soft, non-tender and normal bowel sounds  Musculoskeletal:no cyanosis of digits and no clubbing.She has bone pain on palpation  PSYCH: alert & oriented x 3 with fluent speech  NEURO: no focal motor/sensory deficits     CBG (last 3)  No results found for this basename: GLUCAP,  in the last 72 hours   Labs:   Recent Labs Lab 07/08/14 1205 07/09/14 0521  WBC 5.5 6.3  HGB 8.3* 7.4*  HCT 25.0* 21.9*  PLT 199 166  MCV 96.5 96.9  MCH 32.0 32.7  MCHC 33.2 33.8  RDW 13.1 13.4  LYMPHSABS 0.9 0.7  MONOABS 0.6 0.3  EOSABS 0.1 0.0  BASOSABS 0.1 0.0     Chemistries:    Recent Labs Lab 07/08/14 1205 07/09/14 0521  NA 138 139  K 3.5* 4.1  CL 97 103  CO2 24 23  GLUCOSE 93 124*  BUN 33* 39*  CREATININE 2.83* 2.78*  CALCIUM 13.1* 11.2*  AST 17 21  ALT 16 19  ALKPHOS 86 82  BILITOT 0.2* <0.2*    GFR Estimated Creatinine Clearance: 15.4 ml/min (by C-G formula based on Cr of 2.78).  Liver Function Tests:  Recent Labs Lab 07/08/14 1205 07/09/14 0521  AST 17 21  ALT 16 19  ALKPHOS 86 82  BILITOT 0.2* <0.2*  PROT 8.8* 7.6  ALBUMIN 3.6 3.2*  Imaging Studies:  No results found.  Assessment/Plan: 63 y.o.   #1 multiple myeloma  She was admitted on 8/5 for origin treatment due to significant progression of her disease.  The risk of not admitting her to start treatment would the risk of death.  Another baseline blood work was redrawn.  We discussed the role of chemotherapy. The intent is for palliative.  She received Bortezomib (Velcade) on D1, 4, 8, 11 sq with Dexamethasone q 21d on 8/5  #2 severe hypercalcemia  EKG showed no significant EKG abnormalities.  She received aggressive IV fluid resuscitation, intravenous dexamethasone and chemotherapy as mentioned above Her Calcium improved from 13.1 to  11.2 Will consider forced diuresis if Calcium does not show further improvement   IV pamidronate is contraindicated due to renal dysfunction.   #3 acute renal failure due to multiple myeloma.  Hopefully, this is reversible with origin treatment.  She will continue on aggressive IV fluid resuscitation.   #4 anemia  This is related to multiple myeloma.  She is symptomatic.  We discussed some of the risks, benefits, and alternatives of blood transfusions. The patient is symptomatic from anemia and the hemoglobin level is critically low.  Some of the side-effects to be expected including risks of transfusion reactions, chills, infection, syndrome of volume overload and risk of hospitalization from various reasons and the patient is willing to proceed and went ahead to sign consent today.  #5 DVT prophylaxis  On  heparin subcutaneous   #6 CODE STATUS  Full code   #7 constipation  I will continue on laxative regularly. This is related to dehydration and severe hypercalcemia   #8 bone pain and vertebral fracture  Her pain medication was increased on admission.  A skeletal survey will be performed today  Will consider MRI scan of the thoracic spine depending on the results of the skeletal survey.   #9 hyperuricemia  She is at high-risk of tumor lysis syndrome.She was started on  Rasburicase ( day 2).  Will hold off starting her on allopurinol for now due to severe renal dysfunction  #10 Discharge planning DC tomorrow if stable and hypercalcemia resolved to normal range.   **Disclaimer: This note was dictated with voice recognition software. Similar sounding words can inadvertently be transcribed and this note may contain transcription errors which may not have been corrected upon publication of note.Sharene Butters E, PA-C 07/09/2014  6:31 AM Auden Tatar, MD 07/09/2014

## 2014-07-09 NOTE — Progress Notes (Signed)
[facility] Complete Mariane Masters, BSN, [place] [phone]/09/14 16:00

## 2014-07-10 ENCOUNTER — Other Ambulatory Visit: Payer: Self-pay | Admitting: Hematology and Oncology

## 2014-07-10 ENCOUNTER — Telehealth: Payer: Self-pay | Admitting: Hematology and Oncology

## 2014-07-10 ENCOUNTER — Telehealth: Payer: Self-pay | Admitting: *Deleted

## 2014-07-10 ENCOUNTER — Encounter: Payer: Self-pay | Admitting: Hematology and Oncology

## 2014-07-10 ENCOUNTER — Other Ambulatory Visit: Payer: Self-pay | Admitting: *Deleted

## 2014-07-10 DIAGNOSIS — C9000 Multiple myeloma not having achieved remission: Secondary | ICD-10-CM

## 2014-07-10 DIAGNOSIS — M898X9 Other specified disorders of bone, unspecified site: Secondary | ICD-10-CM

## 2014-07-10 HISTORY — DX: Other specified disorders of bone, unspecified site: M89.8X9

## 2014-07-10 LAB — TYPE AND SCREEN
ABO/RH(D): A POS
ANTIBODY SCREEN: NEGATIVE
Unit division: 0

## 2014-07-10 LAB — COMPREHENSIVE METABOLIC PANEL
ALK PHOS: 115 U/L (ref 39–117)
ALT: 93 U/L — AB (ref 0–35)
AST: 101 U/L — AB (ref 0–37)
Albumin: 3.3 g/dL — ABNORMAL LOW (ref 3.5–5.2)
Anion gap: 14 (ref 5–15)
BUN: 42 mg/dL — ABNORMAL HIGH (ref 6–23)
CHLORIDE: 104 meq/L (ref 96–112)
CO2: 21 mEq/L (ref 19–32)
Calcium: 10.3 mg/dL (ref 8.4–10.5)
Creatinine, Ser: 2.48 mg/dL — ABNORMAL HIGH (ref 0.50–1.10)
GFR calc non Af Amer: 20 mL/min — ABNORMAL LOW (ref >90)
GFR, EST AFRICAN AMERICAN: 23 mL/min — AB (ref >90)
Glucose, Bld: 92 mg/dL (ref 70–99)
POTASSIUM: 3.8 meq/L (ref 3.7–5.3)
SODIUM: 139 meq/L (ref 137–147)
Total Protein: 7.7 g/dL (ref 6.0–8.3)

## 2014-07-10 LAB — PROTEIN ELECTROPH W RFLX QUANT IMMUNOGLOBULINS
ALPHA-1-GLOBULIN: 5.4 % — AB (ref 2.9–4.9)
Albumin ELP: 42.1 % — ABNORMAL LOW (ref 55.8–66.1)
Alpha-2-Globulin: 12.7 % — ABNORMAL HIGH (ref 7.1–11.8)
Beta 2: 27.9 % — ABNORMAL HIGH (ref 3.2–6.5)
Beta Globulin: 7 % (ref 4.7–7.2)
GAMMA GLOBULIN: 4.9 % — AB (ref 11.1–18.8)
M-Spike, %: 0.11 g/dL
TOTAL PROTEIN ELP: 8.2 g/dL (ref 6.0–8.3)

## 2014-07-10 LAB — URINALYSIS W MICROSCOPIC (NOT AT ARMC)
Bilirubin Urine: NEGATIVE
GLUCOSE, UA: NEGATIVE mg/dL
Ketones, ur: NEGATIVE mg/dL
Nitrite: NEGATIVE
PROTEIN: 30 mg/dL — AB
Specific Gravity, Urine: 1.01 (ref 1.005–1.030)
UROBILINOGEN UA: 0.2 mg/dL (ref 0.0–1.0)
pH: 6.5 (ref 5.0–8.0)

## 2014-07-10 LAB — BETA 2 MICROGLOBULIN, SERUM: Beta-2 Microglobulin: 12.8 mg/L — ABNORMAL HIGH (ref <=2.51)

## 2014-07-10 LAB — CBC WITH DIFFERENTIAL/PLATELET
BASOS PCT: 0 % (ref 0–1)
Basophils Absolute: 0 10*3/uL (ref 0.0–0.1)
EOS ABS: 0 10*3/uL (ref 0.0–0.7)
EOS PCT: 0 % (ref 0–5)
HCT: 27.2 % — ABNORMAL LOW (ref 36.0–46.0)
Hemoglobin: 9.1 g/dL — ABNORMAL LOW (ref 12.0–15.0)
LYMPHS ABS: 1.2 10*3/uL (ref 0.7–4.0)
Lymphocytes Relative: 16 % (ref 12–46)
MCH: 32 pg (ref 26.0–34.0)
MCHC: 33.5 g/dL (ref 30.0–36.0)
MCV: 95.8 fL (ref 78.0–100.0)
Monocytes Absolute: 0.6 10*3/uL (ref 0.1–1.0)
Monocytes Relative: 9 % (ref 3–12)
NEUTROS PCT: 76 % (ref 43–77)
Neutro Abs: 5.7 10*3/uL (ref 1.7–7.7)
PLATELETS: 163 10*3/uL (ref 150–400)
RBC: 2.84 MIL/uL — AB (ref 3.87–5.11)
RDW: 15 % (ref 11.5–15.5)
WBC: 7.5 10*3/uL (ref 4.0–10.5)

## 2014-07-10 LAB — IGG, IGA, IGM
IgA: 1760 mg/dL — ABNORMAL HIGH (ref 69–380)
IgG (Immunoglobin G), Serum: 467 mg/dL — ABNORMAL LOW (ref 690–1700)
IgM, Serum: 35 mg/dL — ABNORMAL LOW (ref 52–322)

## 2014-07-10 LAB — IMMUNOFIXATION ADD-ON

## 2014-07-10 LAB — LACTATE DEHYDROGENASE: LDH: 370 U/L — AB (ref 94–250)

## 2014-07-10 LAB — URIC ACID: Uric Acid, Serum: 0.3 mg/dL — ABNORMAL LOW (ref 2.4–7.0)

## 2014-07-10 MED ORDER — ACYCLOVIR 400 MG PO TABS: 400.0000 mg | ORAL_TABLET | Freq: Every day | ORAL | Status: DC

## 2014-07-10 MED ORDER — HYDROMORPHONE HCL 2 MG PO TABS: 2.0000 mg | ORAL_TABLET | ORAL | Status: DC | PRN

## 2014-07-10 MED ORDER — CIPROFLOXACIN HCL 500 MG PO TABS
500.0000 mg | ORAL_TABLET | Freq: Every day | ORAL | Status: DC
  Administered 2014-07-10: 500 mg via ORAL
  Filled 2014-07-10: qty 1

## 2014-07-10 MED ORDER — CIPROFLOXACIN HCL 500 MG PO TABS: 500.0000 mg | ORAL_TABLET | Freq: Two times a day (BID) | ORAL | Status: DC

## 2014-07-10 MED ORDER — SENNOSIDES-DOCUSATE SODIUM 8.6-50 MG PO TABS: 2.0000 | ORAL_TABLET | Freq: Two times a day (BID) | ORAL | Status: DC

## 2014-07-10 MED ORDER — DEXAMETHASONE 6 MG PO TABS
12.0000 mg | ORAL_TABLET | Freq: Once | ORAL | Status: AC
  Administered 2014-07-10: 12 mg via ORAL
  Filled 2014-07-10: qty 2

## 2014-07-10 NOTE — Progress Notes (Signed)
Pt discharged home with sister in stable condition. Discharge instructions and script given. Pt verbalized understanding

## 2014-07-10 NOTE — Discharge Summary (Signed)
Physician Discharge Summary  Marissa Parks NJS:[id number] DOB: 11-Oct-1951 DOA: 07/08/2014  PCP: Mickie Hillier, MD  Admit date: 07/08/2014 Discharge date: 07/10/2014  I have seen the patient, examined her and edited the notes as follows   Recommendations for Outpatient Follow-Up:   8/10  12:15 pm for labs at the [facility], telephone [phone] 8/10 12:45  pm for follow up with Dr. Bertis Ruddy   Discharge Diagnosis:   Active Problems:   Hypercalcemia Multiple Myeloma Acute Renal Failure secondary to Multiple Myeloma Anemia Constipation Bone pain and Vertebral Fracture  Hyperuricemia Mild Hematuria   Discharge Condition: Improved.  Diet recommendation: Regular.   History of Present Illness:   Marissa Parks 63 y.o. female was referred by a nephrologist. She presented with worsening renal failure, anemia, and so the vertebral fracture with worsening bone pain.    In review, 10 days prior,  she was referred to see the nephrologist for renal dysfunction and blood work at that time revealed abnormal serum protein electrophoresis with over 3.5 g of IgA kappa secretion in the urine with serum M spike of 1.9 g. She was noted to be in renal failure with creatinine of 2.2 and severe hypercalcemia.  She had severe mid thoracic bone pain not alleviated by  tramadol. She also had severe constipation recently. She denied any nausea. She does have very poor appetite and had lost some weight. Patient denied history of recurrent infection or atypical infections such as shingles of meningitis.  Denied chills, night sweats, anorexia or abnormal weight loss.  She was admitted for evaluation and management of symptoms due to significant progression of her disease   Hospital Course by Problem:   #1 multiple myeloma  She was admitted on 8/5 for origin treatment due to significant progression of her disease.  The risk of not admitting her to start treatment would the risk of death.  Another  baseline blood work was redrawn.  We discussed the role of chemotherapy. The intent is for palliative.  She received Bortezomib (Velcade) on D1, 4, 8, 11 sq with Dexamethasone q 21d on 8/5 with good tolerance   #2 severe hypercalcemia  EKG showed no significant EKG abnormalities.  She received aggressive IV fluid resuscitation, intravenous dexamethasone and chemotherapy as mentioned above  Her Calcium improved from 13.1 to 11.2, today at 10.3  IV pamidronate is contraindicated due to renal dysfunction.   #3 acute renal failure due to multiple myeloma.  Hopefully, this is reversible with origin treatment.  Aggressive IV fluid resuscitation was given with some improvement, today with a Creatinine of 2.48.   #4 Mild Hematuria  She noted blood tinged urine without clots on 8/7 with last diuresis, along with some hesitancy and urgency. Patient is afebrile.  Will obtain urinalysis with cultures to rule out urinary tract infection. A prescription for Cipro 500 mg bid was written to take as outpatient  #5 anemia  This is related to multiple myeloma.  She is symptomatic.  She received 2 units of blood on 8/6 with good response. She is less symptomatic for anemia this morning.   #6 DVT prophylaxis  She received heparin subcutaneous  #7 CODE STATUS  Full code   #8 constipation  Will continue on laxative regularly. This is related to dehydration and severe hypercalcemia   #9 bone pain and vertebral fracture  Her pain medication was increased on admission.  Skeletal survey on 8/6 showed numerous scattered lytic lesions throughout the calvarium.  Also seen,multiple bilateral rib fractures, most  of which appear old although there does appear to be an acute left second rib fracture. Age-indeterminate mild compression fractures at T11 and T12.  Will consider MRI scan of the thoracic spine as outpatient for further evaluation   #10 hyperuricemia  She is at high-risk of tumor lysis syndrome. She  received 2 doses of Rasburicase on 8/5 and 8/6. This was held and to be started on allopurinol for now due to severe renal dysfunction.    Discharge Medications      Medication List    TAKE these medications       acyclovir 400 MG tablet  Commonly known as:  ZOVIRAX  Take 1 tablet (400 mg total) by mouth daily.     atorvastatin 10 MG tablet  Commonly known as:  LIPITOR  Take 10 mg by mouth every morning.     cholecalciferol 1000 UNITS tablet  Commonly known as:  VITAMIN D  Take 2,000 Units by mouth every morning.     ciprofloxacin 500 MG tablet  Commonly known as:  CIPRO  Take 1 tablet (500 mg total) by mouth 2 (two) times daily.     irbesartan-hydrochlorothiazide 300-12.5 MG per tablet  Commonly known as:  AVALIDE     omeprazole 20 MG capsule  Commonly known as:  PRILOSEC  Take 20 mg by mouth every morning.     polyvinyl alcohol 1.4 % ophthalmic solution  Commonly known as:  LIQUIFILM TEARS  Place 1-2 drops into both eyes 2 (two) times daily as needed for dry eyes.     senna-docusate 8.6-50 MG per tablet  Commonly known as:  Senokot-S  Take 2 tablets by mouth 2 (two) times daily.     traMADol 50 MG tablet  Commonly known as:  ULTRAM  Take 50 mg by mouth every 12 (twelve) hours as needed (pain.). 0400 and 1600.      ASK your doctor about these medications       HYDROmorphone 2 MG tablet  Commonly known as:  DILAUDID  Take 1 tablet (2 mg total) by mouth every 4 (four) hours as needed for severe pain.  Ask about: Which instructions should I use?           Medical Consultants:    None.   Discharge Exam:   Filed Vitals:   07/10/14 0700  BP: 151/71  Pulse:   Temp:   Resp:    Filed Vitals:   07/09/14 1445 07/09/14 2011 07/10/14 0448 07/10/14 0700  BP: 154/68 130/75 172/77 151/71  Pulse: 61 83 83   Temp: 98.2 F (36.8 C) 97.6 F (36.4 C) 97.5 F (36.4 C)   TempSrc: Oral Oral Oral   Resp: $Remo'16 16 16   'cArJW$ Height:      Weight:      SpO2: 100%  98% 97%     GENERAL:alert, no distress and comfortable. She looks thin and cachectic  SKIN: skin color is pale, texture, turgor are normal, no rashes or significant lesions  EYES: normal, conjunctiva are pale and non-injected, sclera clear  OROPHARYNX:no exudate, no erythema and lips, buccal mucosa, and tongue normal. she has dry mucous membranes  NECK: supple, thyroid normal size, non-tender, without nodularity  LYMPH: no palpable lymphadenopathy in the cervical, axillary or inguinal  LUNGS: clear to auscultation and percussion with normal breathing effort  HEART: regular rate & rhythm and no murmurs and no lower extremity edema  ABDOMEN:abdomen soft, non-tender and normal bowel sounds. No suprapubic tenderness  Musculoskeletal:no cyanosis of digits  and no clubbing.She has bone pain on palpation  PSYCH: alert & oriented x 3 with fluent speech  NEURO: no focal motor/sensory deficits     The results of significant diagnostics from this hospitalization (including imaging, microbiology, ancillary and laboratory) are listed below for reference.     Procedures and Diagnostic Studies:   Imaging Studies:  Dg Bone Survey Met  07/09/2014 CLINICAL DATA: Myeloma staging. Back and pelvic pain. EXAM: METASTATIC BONE SURVEY COMPARISON: None. FINDINGS: Old right second, third and seventh rib fractures. Old left fifth rib fracture. Acute appearing left second rib fracture. Mild compression fractures noted in the lower thoracic spine, likely T11 and T12. These are age indeterminate but not present on prior chest x-ray from attenuated 2014. Scattered lytic lesions throughout the bony calvarium. No additional suspicious lytic lesions noted. IMPRESSION: Numerous scattered lytic lesions throughout the calvarium. Multiple bilateral rib fractures, most of which appear old although there does appear to be an acute left second rib fracture. Age-indeterminate mild compression fractures at T11 and T12. Electronically  Signed By: Rolm Baptise M.D. On: 07/09/2014 12:46     Labs:   Basic Metabolic Panel:  Recent Labs Lab 07/08/14 1205 07/09/14 0521 07/10/14 0445  NA 138 139 139  K 3.5* 4.1 3.8  CL 97 103 104  CO2 $Re'24 23 21  'OfS$ GLUCOSE 93 124* 92  BUN 33* 39* 42*  CREATININE 2.83* 2.78* 2.48*  CALCIUM 13.1* 11.2* 10.3  PHOS 2.9  --   --    GFR Estimated Creatinine Clearance: 17.3 ml/min (by C-G formula based on Cr of 2.48). Liver Function Tests:  Recent Labs Lab 07/08/14 1205 07/09/14 0521 07/10/14 0445  AST 17 21 101*  ALT 16 19 93*  ALKPHOS 86 82 115  BILITOT 0.2* <0.2* <0.2*  PROT 8.8* 7.6 7.7  ALBUMIN 3.6 3.2* 3.3*    CBC:  Recent Labs Lab 07/08/14 1205 07/09/14 0521 07/10/14 0445  WBC 5.5 6.3 7.5  NEUTROABS 3.8 5.4 5.7  HGB 8.3* 7.4* 9.1*  HCT 25.0* 21.9* 27.2*  MCV 96.5 96.9 95.8  PLT 199 166 163     Microbiology $RemoveBefor'@MICRORSLT2'rMKxlNFgnAxC$ @ Urine culture pending  Signed:  Sharene Butters E  07/10/2014, 10:16 AM  Mirely Pangle, MD 07/10/2014

## 2014-07-10 NOTE — Telephone Encounter (Signed)
Per 08/07 POF labs/ov, pt is aware of apt. KJ

## 2014-07-10 NOTE — Progress Notes (Signed)
Marissa Parks   DOB:[birth year]   UY#:[id number]   DGL#:[id number]  I have seen the patient, examined her and edited the notes as follows  Subjective: Events since 8/6 noted. Afebrile. She had a good night of sleep. She complains of urinary urgency and hesitancy, with blood tinged urine this morning. Her pain is well controlled with meds. Denies any nausea or vomiting. Appetite is "better this morning". Denies shortness of breath or cardiac complaints. Denies chills, night sweats. She states that she is to have a bowel movement this morning.   Scheduled Meds: . acyclovir  400 mg Oral Daily  . cholecalciferol  2,000 Units Oral q morning - 10a  . heparin  5,000 Units Subcutaneous 3 times per day  . pantoprazole  40 mg Oral Daily  . senna-docusate  2 tablet Oral BID   Continuous Infusions: . sodium chloride 100 mL/hr at 07/10/14 0439   PRN Meds:.acetaminophen, alum & mag hydroxide-simeth, HYDROmorphone (DILAUDID) injection, ondansetron (ZOFRAN) IV, ondansetron (ZOFRAN) IV, ondansetron, ondansetron, polyvinyl alcohol, traMADol   Objective:  Filed Vitals:   07/10/14 0700  BP: 151/71  Pulse:   Temp:   Resp:       Intake/Output Summary (Last 24 hours) at 07/10/14 5188 Last data filed at 07/10/14 0600  Gross per 24 hour  Intake  372.5 ml  Output   1075 ml  Net -702.5 ml    ECOG PERFORMANCE STATUS:  1 Symptomatic but completely ambulatory (Restricted in physically strenuous activity but ambulatory and able to carry out work of a light or sedentary nature. For example, light housework, office work)   GENERAL:alert, no distress and comfortable. She looks thin and cachectic  SKIN: skin color is pale, texture, turgor are normal, no rashes or significant lesions  EYES: normal, conjunctiva are pale and non-injected, sclera clear  OROPHARYNX:no exudate, no erythema and lips, buccal mucosa, and tongue normal. she has dry mucous membranes  NECK: supple, thyroid normal size, non-tender, without  nodularity  LYMPH: no palpable lymphadenopathy in the cervical, axillary or inguinal  LUNGS: clear to auscultation and percussion with normal breathing effort  HEART: regular rate & rhythm and no murmurs and no lower extremity edema  ABDOMEN:abdomen soft, non-tender and normal bowel sounds. No suprapubic tenderness Musculoskeletal:no cyanosis of digits and no clubbing.She has bone pain on palpation  PSYCH: alert & oriented x 3 with fluent speech  NEURO: no focal motor/sensory deficits     CBG (last 3)  No results found for this basename: GLUCAP,  in the last 72 hours   Labs:   Recent Labs Lab 07/08/14 1205 07/09/14 0521 07/10/14 0445  WBC 5.5 6.3 7.5  HGB 8.3* 7.4* 9.1*  HCT 25.0* 21.9* 27.2*  PLT 199 166 163  MCV 96.5 96.9 95.8  MCH 32.0 32.7 32.0  MCHC 33.2 33.8 33.5  RDW 13.1 13.4 15.0  LYMPHSABS 0.9 0.7 1.2  MONOABS 0.6 0.3 0.6  EOSABS 0.1 0.0 0.0  BASOSABS 0.1 0.0 0.0     Chemistries:    Recent Labs Lab 07/08/14 1205 07/09/14 0521 07/10/14 0445  NA 138 139 139  K 3.5* 4.1 3.8  CL 97 103 104  CO2 $Re'24 23 21  'hxw$ GLUCOSE 93 124* 92  BUN 33* 39* 42*  CREATININE 2.83* 2.78* 2.48*  CALCIUM 13.1* 11.2* 10.3  AST 17 21 101*  ALT 16 19 93*  ALKPHOS 86 82 115  BILITOT 0.2* <0.2* <0.2*    GFR Estimated Creatinine Clearance: 17.3 ml/min (by C-G formula based on Cr  of 2.48).  Liver Function Tests:  Recent Labs Lab 07/08/14 1205 07/09/14 0521 07/10/14 0445  AST 17 21 101*  ALT 16 19 93*  ALKPHOS 86 82 115  BILITOT 0.2* <0.2* <0.2*  PROT 8.8* 7.6 7.7  ALBUMIN 3.6 3.2* 3.3*      Imaging Studies: I reviewed the imaging Dg Bone Survey Met  07/09/2014   CLINICAL DATA:  Myeloma staging.  Back and pelvic pain.  EXAM: METASTATIC BONE SURVEY  COMPARISON:  None.  FINDINGS: Old right second, third and seventh rib fractures. Old left fifth rib fracture. Acute appearing left second rib fracture.  Mild compression fractures noted in the lower thoracic spine, likely  T11 and T12. These are age indeterminate but not present on prior chest x-ray from attenuated 2014.  Scattered lytic lesions throughout the bony calvarium. No additional suspicious lytic lesions noted.  IMPRESSION: Numerous scattered lytic lesions throughout the calvarium.  Multiple bilateral rib fractures, most of which appear old although there does appear to be an acute left second rib fracture.  Age-indeterminate mild compression fractures at T11 and T12.   Electronically Signed   By: Rolm Baptise M.D.   On: 07/09/2014 12:46     Assessment/Plan: 63 y.o.   #1 multiple myeloma  She was admitted on 8/5 for origin treatment due to significant progression of her disease.  The risk of not admitting her to start treatment would the risk of death.  Another baseline blood work was redrawn.  We discussed the role of chemotherapy. The intent is for palliative.  She received Bortezomib (Velcade) on D1, 4, 8, 11 sq with Dexamethasone q 21d on 8/5 with good tolerance She can continue Rx as out-patient next week  #2 severe hypercalcemia, resolved EKG showed no significant EKG abnormalities.  She received aggressive IV fluid resuscitation, intravenous dexamethasone and chemotherapy as mentioned above Her Calcium improved from 13.1 to 11.2, today at 10.3 IV pamidronate is contraindicated due to renal dysfunction.   #3 acute renal failure due to multiple myeloma, improving Hopefully, this is reversible with origin treatment.  Aggressive IV fluid resuscitation was given with some improvement, today with a Creatinine of 2.48.   #4  Mild Hematuria She noted blood tinged urine on 8/7 with last diuresis, along with some hesitancy and urgency. Patient is afebrile. Will obtain urinalysis with cultures to rule out urinary tract infection.  Start empiric antibiotics, follow cultures next week  #5 anemia  This is related to multiple myeloma.  She is symptomatic.  She received 2 units of blood on 8/6 with good  response. She is less symptomatic for anemia this morning.   #6 DVT prophylaxis  On heparin subcutaneous   #7 CODE STATUS  Full code   #8 constipation   Will continue on laxative regularly. This is related to dehydration and severe hypercalcemia   #9 bone pain and vertebral fracture  Her pain medication was increased on admission.  Skeletal survey on 8/6 showed numerous scattered lytic lesions throughout the calvarium.   Also seen,multiple bilateral rib fractures, most of which appear old although there does appear to be an acute left second rib fracture.  Age-indeterminate mild compression fractures at T11 and T12.  Will consider MRI scan of the thoracic spine for further evaluation as out-patient  #10 hyperuricemia  She is at high-risk of tumor lysis syndrome. She deceived 2 doses of Rasburicase on 8/5 and 8/6.  This was held and to be  started on allopurinol for now due to severe  renal dysfunction.  #11 Discharge planning Possible discharge today    **Disclaimer: This note was dictated with voice recognition software. Similar sounding words can inadvertently be transcribed and this note may contain transcription errors which may not have been corrected upon publication of note.** WERTMAN,SARA E, PA-C 07/10/2014  8:28 AM Margerite Impastato, MD 07/10/2014 Spent 35 mins on discharge

## 2014-07-10 NOTE — Telephone Encounter (Signed)
Pt called back about her antibiotic,  States it is Cipro.  She is allergic to Cephalosporins.  Informed pt Cipro is not cephalosporin.  Pt states her friend was concerned earlier today as pt's face was red and flushed but this has resolved.  Pt states it may have been the heat outside.  She denies any fevers.  Instructed pt to take cipro as prescribed and to call us for any new symptoms or questions.  Call for fever over 100.5 F.  She verbalized understanding.

## 2014-07-10 NOTE — Telephone Encounter (Signed)
Friend called concerned that she thought pt was prescribed Ceftin today and she is allergic to Keflex.  Informed her that pt was prescribed Cipro today but to call us back if she finds it is actually Ceftin. She verbalized understanding.

## 2014-07-12 LAB — URINE CULTURE

## 2014-07-13 ENCOUNTER — Telehealth: Payer: Self-pay | Admitting: Hematology and Oncology

## 2014-07-13 ENCOUNTER — Other Ambulatory Visit (HOSPITAL_BASED_OUTPATIENT_CLINIC_OR_DEPARTMENT_OTHER): Payer: BC Managed Care – PPO

## 2014-07-13 ENCOUNTER — Ambulatory Visit (HOSPITAL_BASED_OUTPATIENT_CLINIC_OR_DEPARTMENT_OTHER): Payer: BC Managed Care – PPO | Admitting: Hematology and Oncology

## 2014-07-13 ENCOUNTER — Encounter: Payer: Self-pay | Admitting: Hematology and Oncology

## 2014-07-13 ENCOUNTER — Other Ambulatory Visit: Payer: Self-pay | Admitting: Hematology and Oncology

## 2014-07-13 ENCOUNTER — Telehealth: Payer: Self-pay | Admitting: *Deleted

## 2014-07-13 ENCOUNTER — Ambulatory Visit (HOSPITAL_BASED_OUTPATIENT_CLINIC_OR_DEPARTMENT_OTHER): Payer: BC Managed Care – PPO

## 2014-07-13 VITALS — BP 177/78 | HR 99 | Temp 98.5°F | Resp 18 | Ht 60.25 in | Wt 104.1 lb

## 2014-07-13 DIAGNOSIS — C9000 Multiple myeloma not having achieved remission: Secondary | ICD-10-CM

## 2014-07-13 DIAGNOSIS — K5900 Constipation, unspecified: Secondary | ICD-10-CM

## 2014-07-13 DIAGNOSIS — M949 Disorder of cartilage, unspecified: Secondary | ICD-10-CM

## 2014-07-13 DIAGNOSIS — M898X9 Other specified disorders of bone, unspecified site: Secondary | ICD-10-CM

## 2014-07-13 DIAGNOSIS — N390 Urinary tract infection, site not specified: Secondary | ICD-10-CM | POA: Insufficient documentation

## 2014-07-13 DIAGNOSIS — Z5112 Encounter for antineoplastic immunotherapy: Secondary | ICD-10-CM

## 2014-07-13 DIAGNOSIS — K5909 Other constipation: Secondary | ICD-10-CM | POA: Insufficient documentation

## 2014-07-13 DIAGNOSIS — M899 Disorder of bone, unspecified: Secondary | ICD-10-CM

## 2014-07-13 DIAGNOSIS — IMO0002 Reserved for concepts with insufficient information to code with codable children: Secondary | ICD-10-CM | POA: Insufficient documentation

## 2014-07-13 DIAGNOSIS — R110 Nausea: Secondary | ICD-10-CM

## 2014-07-13 HISTORY — DX: Nausea: R11.0

## 2014-07-13 LAB — COMPREHENSIVE METABOLIC PANEL
ALT: 75 U/L — AB (ref 0–35)
AST: 22 U/L (ref 0–37)
Albumin: 3.9 g/dL (ref 3.5–5.2)
Alkaline Phosphatase: 90 U/L (ref 39–117)
BILIRUBIN TOTAL: 0.4 mg/dL (ref 0.2–1.2)
BUN: 23 mg/dL (ref 6–23)
CALCIUM: 10.4 mg/dL (ref 8.4–10.5)
CHLORIDE: 100 meq/L (ref 96–112)
CO2: 24 mEq/L (ref 19–32)
CREATININE: 2.05 mg/dL — AB (ref 0.50–1.10)
GLUCOSE: 91 mg/dL (ref 70–99)
Potassium: 3.3 mEq/L — ABNORMAL LOW (ref 3.5–5.3)
Sodium: 136 mEq/L (ref 135–145)
TOTAL PROTEIN: 7.2 g/dL (ref 6.0–8.3)

## 2014-07-13 LAB — CBC WITH DIFFERENTIAL/PLATELET
BASO%: 0.5 % (ref 0.0–2.0)
Basophils Absolute: 0 10*3/uL (ref 0.0–0.1)
EOS ABS: 0 10*3/uL (ref 0.0–0.5)
EOS%: 0.7 % (ref 0.0–7.0)
HEMATOCRIT: 28.2 % — AB (ref 34.8–46.6)
HEMOGLOBIN: 9.1 g/dL — AB (ref 11.6–15.9)
LYMPH#: 1.1 10*3/uL (ref 0.9–3.3)
LYMPH%: 15.2 % (ref 14.0–49.7)
MCH: 31.1 pg (ref 25.1–34.0)
MCHC: 32.4 g/dL (ref 31.5–36.0)
MCV: 96 fL (ref 79.5–101.0)
MONO#: 0.7 10*3/uL (ref 0.1–0.9)
MONO%: 10.3 % (ref 0.0–14.0)
NEUT%: 73.3 % (ref 38.4–76.8)
NEUTROS ABS: 5.1 10*3/uL (ref 1.5–6.5)
PLATELETS: 211 10*3/uL (ref 145–400)
RBC: 2.94 10*6/uL — ABNORMAL LOW (ref 3.70–5.45)
RDW: 14.4 % (ref 11.2–14.5)
WBC: 6.9 10*3/uL (ref 3.9–10.3)

## 2014-07-13 LAB — URIC ACID: Uric Acid, Serum: 2.8 mg/dL (ref 2.4–7.0)

## 2014-07-13 LAB — LACTATE DEHYDROGENASE: LDH: 260 U/L — ABNORMAL HIGH (ref 94–250)

## 2014-07-13 MED ORDER — DEXAMETHASONE 4 MG PO TABS: ORAL_TABLET | ORAL | Status: DC

## 2014-07-13 MED ORDER — HYDROMORPHONE HCL 2 MG PO TABS: 2.0000 mg | ORAL_TABLET | ORAL | Status: DC | PRN

## 2014-07-13 MED ORDER — DEXAMETHASONE 4 MG PO TABS
ORAL_TABLET | ORAL | Status: AC
  Filled 2014-07-13: qty 10

## 2014-07-13 MED ORDER — BORTEZOMIB CHEMO SQ INJECTION 3.5 MG (2.5MG/ML)
1.3000 mg/m2 | Freq: Once | INTRAMUSCULAR | Status: AC
  Administered 2014-07-13: 1.75 mg via SUBCUTANEOUS
  Filled 2014-07-13: qty 1.75

## 2014-07-13 MED ORDER — DEXAMETHASONE 4 MG PO TABS
40.0000 mg | ORAL_TABLET | Freq: Once | ORAL | Status: AC
  Administered 2014-07-13: 40 mg via ORAL

## 2014-07-13 MED ORDER — ONDANSETRON HCL 8 MG PO TABS
ORAL_TABLET | ORAL | Status: AC
  Filled 2014-07-13: qty 1

## 2014-07-13 MED ORDER — PROMETHAZINE HCL 25 MG PO TABS: 25.0000 mg | ORAL_TABLET | Freq: Four times a day (QID) | ORAL | Status: DC | PRN

## 2014-07-13 MED ORDER — ONDANSETRON HCL 8 MG PO TABS
8.0000 mg | ORAL_TABLET | Freq: Once | ORAL | Status: AC
  Administered 2014-07-13: 8 mg via ORAL

## 2014-07-13 NOTE — Patient Instructions (Signed)
[facility] Discharge Instructions for Patients Receiving Chemotherapy  Today you received the following chemotherapy agents: Velcade.  To help prevent nausea and vomiting after your treatment, we encourage you to take your nausea medication as prescribed.   If you develop nausea and vomiting that is not controlled by your nausea medication, call the clinic.   BELOW ARE SYMPTOMS THAT SHOULD BE REPORTED IMMEDIATELY:  *FEVER GREATER THAN 100.5 F  *CHILLS WITH OR WITHOUT FEVER  NAUSEA AND VOMITING THAT IS NOT CONTROLLED WITH YOUR NAUSEA MEDICATION  *UNUSUAL SHORTNESS OF BREATH  *UNUSUAL BRUISING OR BLEEDING  TENDERNESS IN MOUTH AND THROAT WITH OR WITHOUT PRESENCE OF ULCERS  *URINARY PROBLEMS  *BOWEL PROBLEMS  UNUSUAL RASH Items with * indicate a potential emergency and should be followed up as soon as possible.  Feel free to call the clinic you have any questions or concerns. The clinic phone number is [phone].    

## 2014-07-13 NOTE — Telephone Encounter (Signed)
Pt confirmed labs/ov per 08/10 POF, gave pt AVS..Marland KitchenKJ

## 2014-07-13 NOTE — Progress Notes (Signed)
Per Dr. Alvy Bimler, okay to proceed with treatment today without CMET results from 8/10.

## 2014-07-13 NOTE — Assessment & Plan Note (Signed)
I recommend she takes antiemetics as needed.

## 2014-07-13 NOTE — Assessment & Plan Note (Signed)
I am waiting for MRI of the back. I will prefer her to surgery for kyphoplasty

## 2014-07-13 NOTE — Assessment & Plan Note (Signed)
This could be related to pain medicine, hypercalcemia and decreased mobility. I recommend aggressive laxative therapy.

## 2014-07-13 NOTE — Assessment & Plan Note (Signed)
We discussed about the role of treatment. I plan to give her induction chemotherapy with Velcade and dexamethasone and to add on Revlimid once the kidney function improves. I recommend she gets dental clearance before I begin IV bisphosphonates. I hope kidney function will improve further before I prescribed IV bisphosphonates. I recommend she continue vitamin D supplement and acyclovir as antimicrobial prophylaxis. I also discussed about the role of bone marrow transplant and will refer her to the bone marrow transplant Center in the near future.

## 2014-07-13 NOTE — Telephone Encounter (Signed)
Per staff message and POF I have scheduled appts. Advised scheduler of appts. JMW  

## 2014-07-13 NOTE — Assessment & Plan Note (Signed)
I refilled her prescription pain medicine. I warned her about side effects and we discussed narcotic refill policy.

## 2014-07-13 NOTE — Progress Notes (Signed)
[facility] OFFICE PROGRESS NOTE  Patient Care Team: Catha Gosselin, MD as PCP - General (Family Medicine) Coletta Memos, MD as Consulting Physician (Neurosurgery)  SUMMARY OF ONCOLOGIC HISTORY: Oncology History   Multiple myeloma without remission; Calcium 13.3, Creatinine 2.83, Hg 8.3, Bone lesions present, Beta 2 microglobulin 12.8, Albumin 3.5, IgA 1760 and kappa light chains 1670. Bone marrow biopsy showed 82% involvement.   Primary site: Multiple Myeloma   Staging method: AJCC 6th Edition   Clinical: Stage IIIB signed by Artis Delay, MD on 07/13/2014 12:38 PM   Summary: Stage IIIB       Multiple myeloma without remission   07/07/2014 - 07/10/2014 Hospital Admission She was admitted to the hospital for management of malignant hypercalcemia and had bone marrow biopsy which confirmed diagnosis of multiple myeloma. The patient received one unit of blood transfusion due to severe anemia.   07/08/2014 Bone Marrow Biopsy The patient had bone marrow biopsy that confirmed diagnosis.   07/08/2014 Pathology Results Accession: [id number] showed 82% involvement of bone marrow.   07/08/2014 -  Chemotherapy She was started on Velcade along with dexamethasone.    INTERVAL HISTORY: Please see below for problem oriented charting. She feels better after discharge. Her energy level has improved. The symptoms of dysuria has resolved. She takes on average 2 pain medicine a day for back pain. She has mild constipation, resolved with laxatives.  REVIEW OF SYSTEMS:   Constitutional: Denies fevers, chills or abnormal weight loss Eyes: Denies blurriness of vision Ears, nose, mouth, throat, and face: Denies mucositis or sore throat Respiratory: Denies cough, dyspnea or wheezes Cardiovascular: Denies palpitation, chest discomfort or lower extremity swelling Skin: Denies abnormal skin rashes Lymphatics: Denies new lymphadenopathy or easy bruising Neurological:Denies numbness, tingling or new  weaknesses Behavioral/Psych: Mood is stable, no new changes  All other systems were reviewed with the patient and are negative.  I have reviewed the past medical history, past surgical history, social history and family history with the patient and they are unchanged from previous note.  ALLERGIES:  is allergic to ace inhibitors; hydrocodone; and cephalexin.  MEDICATIONS:  Current Outpatient Prescriptions  Medication Sig Dispense Refill  . acyclovir (ZOVIRAX) 400 MG tablet Take 1 tablet (400 mg total) by mouth daily.  30 tablet  6  . atorvastatin (LIPITOR) 10 MG tablet Take 10 mg by mouth every morning.       . cholecalciferol (VITAMIN D) 1000 UNITS tablet Take 2,000 Units by mouth every morning.       . ciprofloxacin (CIPRO) 500 MG tablet Take 1 tablet (500 mg total) by mouth 2 (two) times daily.  14 tablet  0  . HYDROmorphone (DILAUDID) 2 MG tablet Take 1 tablet (2 mg total) by mouth every 4 (four) hours as needed for severe pain.  90 tablet  0  . omeprazole (PRILOSEC) 20 MG capsule Take 20 mg by mouth every morning.       . polyvinyl alcohol (LIQUIFILM TEARS) 1.4 % ophthalmic solution Place 1-2 drops into both eyes 2 (two) times daily as needed for dry eyes.       Marland Kitchen senna-docusate (SENOKOT-S) 8.6-50 MG per tablet Take 2 tablets by mouth 2 (two) times daily.  60 tablet  3  . dexamethasone (DECADRON) 4 MG tablet Twice a week on days of chemo  90 tablet  1  . irbesartan-hydrochlorothiazide (AVALIDE) 300-12.5 MG per tablet       . promethazine (PHENERGAN) 25 MG tablet Take 1 tablet (25 mg total)  by mouth every 6 (six) hours as needed for nausea or vomiting.  60 tablet  3  . traMADol (ULTRAM) 50 MG tablet Take 50 mg by mouth every 12 (twelve) hours as needed (pain.). 0400 and 1600.       No current facility-administered medications for this visit.   Facility-Administered Medications Ordered in Other Visits  Medication Dose Route Frequency Provider Last Rate Last Dose  . bortezomib SQ  (VELCADE) chemo injection 1.75 mg  1.3 mg/m2 (Treatment Plan Actual) Subcutaneous Once Artis Delay, MD      . dexamethasone (DECADRON) tablet 40 mg  40 mg Oral Once Artis Delay, MD      . ondansetron (ZOFRAN) tablet 8 mg  8 mg Oral Once Artis Delay, MD        PHYSICAL EXAMINATION: ECOG PERFORMANCE STATUS: 1 - Symptomatic but completely ambulatory  Filed Vitals:   07/13/14 1243  BP: 177/78  Pulse: 99  Temp: 98.5 F (36.9 C)  Resp: 18   Filed Weights   07/13/14 1243  Weight: 104 lb 1.6 oz (47.219 kg)    GENERAL:alert, no distress and comfortable. She looks thin but not cachectic SKIN: skin color, texture, turgor are normal, no rashes or significant lesions EYES: normal, Conjunctiva are pink and non-injected, sclera clear OROPHARYNX:no exudate, no erythema and lips, buccal mucosa, and tongue normal  Musculoskeletal:no cyanosis of digits and no clubbing  NEURO: alert & oriented x 3 with fluent speech, no focal motor/sensory deficits  LABORATORY DATA:  I have reviewed the data as listed    Component Value Date/Time   NA 139 07/10/2014 0445   K 3.8 07/10/2014 0445   CL 104 07/10/2014 0445   CO2 21 07/10/2014 0445   GLUCOSE 92 07/10/2014 0445   BUN 42* 07/10/2014 0445   CREATININE 2.48* 07/10/2014 0445   CALCIUM 10.3 07/10/2014 0445   PROT 7.7 07/10/2014 0445   ALBUMIN 3.3* 07/10/2014 0445   AST 101* 07/10/2014 0445   ALT 93* 07/10/2014 0445   ALKPHOS 115 07/10/2014 0445   BILITOT <0.2* 07/10/2014 0445   GFRNONAA 20* 07/10/2014 0445   GFRAA 23* 07/10/2014 0445    No results found for this basename: SPEP, UPEP,  kappa and lambda light chains    Lab Results  Component Value Date   WBC 6.9 07/13/2014   NEUTROABS 5.1 07/13/2014   HGB 9.1* 07/13/2014   HCT 28.2* 07/13/2014   MCV 96.0 07/13/2014   PLT 211 07/13/2014      Chemistry      Component Value Date/Time   NA 139 07/10/2014 0445   K 3.8 07/10/2014 0445   CL 104 07/10/2014 0445   CO2 21 07/10/2014 0445   BUN 42* 07/10/2014 0445   CREATININE 2.48*  07/10/2014 0445      Component Value Date/Time   CALCIUM 10.3 07/10/2014 0445   ALKPHOS 115 07/10/2014 0445   AST 101* 07/10/2014 0445   ALT 93* 07/10/2014 0445   BILITOT <0.2* 07/10/2014 0445      ASSESSMENT & PLAN:  Multiple myeloma without remission We discussed about the role of treatment. I plan to give her induction chemotherapy with Velcade and dexamethasone and to add on Revlimid once the kidney function improves. I recommend she gets dental clearance before I begin IV bisphosphonates. I hope kidney function will improve further before I prescribed IV bisphosphonates. I recommend she continue vitamin D supplement and acyclovir as antimicrobial prophylaxis. I also discussed about the role of bone marrow transplant and will refer  her to the bone marrow transplant Center in the near future.  UTI (lower urinary tract infection) Her symptoms are improving. I recommend she completes her course of antibiotic treatment.  Nausea alone I recommend she takes antiemetics as needed.  Hypercalcemia Repeat blood work is pending. Continue treatment for multiple myeloma.  Compression fracture I am waiting for MRI of the back. I will prefer her to surgery for kyphoplasty  Bone pain I refilled her prescription pain medicine. I warned her about side effects and we discussed narcotic refill policy.  Constipation This could be related to pain medicine, hypercalcemia and decreased mobility. I recommend aggressive laxative therapy.   No orders of the defined types were placed in this encounter.   All questions were answered. The patient knows to call the clinic with any problems, questions or concerns. No barriers to learning was detected. I spent 40 minutes counseling the patient face to face. The total time spent in the appointment was 55 minutes and more than 50% was on counseling and review of test results     [facility], [place], MD 07/13/2014 1:51 PM

## 2014-07-13 NOTE — Assessment & Plan Note (Signed)
Repeat blood work is pending. Continue treatment for multiple myeloma.

## 2014-07-13 NOTE — Assessment & Plan Note (Signed)
Her symptoms are improving. I recommend she completes her course of antibiotic treatment.

## 2014-07-13 NOTE — Patient Instructions (Signed)
Lenalidomide Oral Capsules What is this medicine? LENALIDOMIDE (len a LID oh mide) is a chemotherapy drug that targets specific proteins within cancer cells and stops the cancer cell from growing. It is used to treat multiple myeloma, mantle cell lymphoma, and some myelodysplastic syndromes that cause severe anemia requiring blood transfusions. This medicine may be used for other purposes; ask your health care provider or pharmacist if you have questions. COMMON BRAND NAME(S): Revlimid What should I tell my health care provider before I take this medicine? They need to know if you have any of these conditions: -blood clots in the legs or the lungs -high blood pressure -high cholesterol -infection -irregular monthly periods or menstrual cycles -kidney disease -liver disease -smoke tobacco -thyroid disease -an unusual or allergic reaction to lenalidomide, other medicines, foods, dyes, or preservatives -pregnant or trying to get pregnant -breast-feeding How should I use this medicine? Take this medicine by mouth with a glass of water. Follow the directions on the prescription label. Do not cut, crush, or chew this medicine. Take your medicine at regular intervals. Do not take it more often than directed. Do not stop taking except on your doctor's advice. A MedGuide will be given with each prescription and refill. Read this guide carefully each time. The MedGuide may change frequently. Talk to your pediatrician regarding the use of this medicine in children. Special care may be needed. Overdosage: If you think you have taken too much of this medicine contact a poison control center or emergency room at once. NOTE: This medicine is only for you. Do not share this medicine with others. What if I miss a dose? If you miss a dose, take it as soon as you can. If your next dose is to be taken in less than 12 hours, then do not take the missed dose. Take the next dose at your regular time. Do not take  double or extra doses. What may interact with this medicine? This medicine may interact with the following medications: -digoxin -medicines that increase the risk of thrombosis like estrogens or erythropoietic agents (e.g., epoetin alfa and darbepoetin alfa) -warfarin This list may not describe all possible interactions. Give your health care provider a list of all the medicines, herbs, non-prescription drugs, or dietary supplements you use. Also tell them if you smoke, drink alcohol, or use illegal drugs. Some items may interact with your medicine. What should I watch for while using this medicine? Visit your doctor for regular check ups. Tell your doctor or healthcare professional if your symptoms do not start to get better or if they get worse. You will need to have important blood work done while you are taking this medicine. This medicine is available only through a special program. Doctors, pharmacies, and patients must meet all of the conditions of the program. Your health care provider will help you get signed up with the program if you need this medicine. Through the program you will only receive up to a 28 day supply of the medicine at one time. You will need a new prescription for each refill. This medicine can cause birth defects. Do not get pregnant while taking this drug. Females with child-bearing potential will need to have 2 negative pregnancy tests before starting this medicine. Pregnancy testing must be done every 2 to 4 weeks as directed while taking this medicine. Use 2 reliable forms of birth control together while you are taking this medicine and for 1 month after you stop taking this medicine. If you  think that you might be pregnant talk to your doctor right away. Men must use a latex condom during sexual contact with a woman while taking this medicine and for 28 days after you stop taking this medicine. A latex condom is needed even if you have had a vasectomy. Contact your doctor  right away if your partner becomes pregnant. Do not donate sperm while taking this medicine and for 28 days after you stop taking this medicine. Do not give blood while taking the medicine and for 1 month after completion of treatment to avoid exposing pregnant women to the medicine through the donated blood. Talk to your doctor about your risk of cancer. You may be more at risk for certain types of cancers if you take this medicine. What side effects may I notice from receiving this medicine? Side effects that you should report to your doctor or health care professional as soon as possible: -allergic reactions like skin rash, itching or hives, swelling of the face, lips, or tongue -breathing problems -chest pain or tightness -fast, irregular heartbeat -low blood counts - this medicine may decrease the number of white blood cells, red blood cells and platelets. You may be at increased risk for infections and bleeding. -seizures -signs and symptoms of bleeding such as bloody or black, tarry stools; red or dark-brown urine; spitting up blood or brown material that looks like coffee grounds; red spots on the skin; unusual bruising or bleeding from the eye, gums, or nose -signs and symptoms of a blood clot such as breathing problems; changes in vision; chest pain; severe, sudden headache; pain, swelling, warmth in the leg; trouble speaking; sudden numbness or weakness of the face, arm or leg -signs and symptoms of liver injury like dark yellow or brown urine; general ill feeling or flu-like symptoms; light-colored stools; loss of appetite; nausea; right upper belly pain; unusually weak or tired; yellowing of the eyes or skin -signs and symptoms of a stroke like changes in vision; confusion; trouble speaking or understanding; severe headaches; sudden numbness or weakness of the face, arm or leg; trouble walking; dizziness; loss of balance or coordination -sweating -vomiting Side effects that usually do  not require medical attention (report to your doctor or health care professional if they continue or are bothersome): -constipation -cough -diarrhea -tiredness This list may not describe all possible side effects. Call your doctor for medical advice about side effects. You may report side effects to FDA at 1-800-FDA-1088. Where should I keep my medicine? Keep out of the reach of children. Store at room temperature between 15 and 30 degrees C (59 and 86 degrees F). Throw away any unused medicine after the expiration date. NOTE: This sheet is a summary. It may not cover all possible information. If you have questions about this medicine, talk to your doctor, pharmacist, or health care provider.  2015, [employer]. (2014-02-24 18:30:01) Lenalidomide Oral Capsules What is this medicine? LENALIDOMIDE (len a LID oh mide) is a chemotherapy drug that targets specific proteins within cancer cells and stops the cancer cell from growing. It is used to treat multiple myeloma, mantle cell lymphoma, and some myelodysplastic syndromes that cause severe anemia requiring blood transfusions. This medicine may be used for other purposes; ask your health care provider or pharmacist if you have questions. COMMON BRAND NAME(S): Revlimid What should I tell my health care provider before I take this medicine? They need to know if you have any of these conditions: -blood clots in the legs or the  lungs -high blood pressure -high cholesterol -infection -irregular monthly periods or menstrual cycles -kidney disease -liver disease -smoke tobacco -thyroid disease -an unusual or allergic reaction to lenalidomide, other medicines, foods, dyes, or preservatives -pregnant or trying to get pregnant -breast-feeding How should I use this medicine? Take this medicine by mouth with a glass of water. Follow the directions on the prescription label. Do not cut, crush, or chew this medicine. Take your medicine at  regular intervals. Do not take it more often than directed. Do not stop taking except on your doctor's advice. A MedGuide will be given with each prescription and refill. Read this guide carefully each time. The MedGuide may change frequently. Talk to your pediatrician regarding the use of this medicine in children. Special care may be needed. Overdosage: If you think you have taken too much of this medicine contact a poison control center or emergency room at once. NOTE: This medicine is only for you. Do not share this medicine with others. What if I miss a dose? If you miss a dose, take it as soon as you can. If your next dose is to be taken in less than 12 hours, then do not take the missed dose. Take the next dose at your regular time. Do not take double or extra doses. What may interact with this medicine? This medicine may interact with the following medications: -digoxin -medicines that increase the risk of thrombosis like estrogens or erythropoietic agents (e.g., epoetin alfa and darbepoetin alfa) -warfarin This list may not describe all possible interactions. Give your health care provider a list of all the medicines, herbs, non-prescription drugs, or dietary supplements you use. Also tell them if you smoke, drink alcohol, or use illegal drugs. Some items may interact with your medicine. What should I watch for while using this medicine? Visit your doctor for regular check ups. Tell your doctor or healthcare professional if your symptoms do not start to get better or if they get worse. You will need to have important blood work done while you are taking this medicine. This medicine is available only through a special program. Doctors, pharmacies, and patients must meet all of the conditions of the program. Your health care provider will help you get signed up with the program if you need this medicine. Through the program you will only receive up to a 28 day supply of the medicine at one  time. You will need a new prescription for each refill. This medicine can cause birth defects. Do not get pregnant while taking this drug. Females with child-bearing potential will need to have 2 negative pregnancy tests before starting this medicine. Pregnancy testing must be done every 2 to 4 weeks as directed while taking this medicine. Use 2 reliable forms of birth control together while you are taking this medicine and for 1 month after you stop taking this medicine. If you think that you might be pregnant talk to your doctor right away. Men must use a latex condom during sexual contact with a woman while taking this medicine and for 28 days after you stop taking this medicine. A latex condom is needed even if you have had a vasectomy. Contact your doctor right away if your partner becomes pregnant. Do not donate sperm while taking this medicine and for 28 days after you stop taking this medicine. Do not give blood while taking the medicine and for 1 month after completion of treatment to avoid exposing pregnant women to the medicine through the donated  blood. Talk to your doctor about your risk of cancer. You may be more at risk for certain types of cancers if you take this medicine. What side effects may I notice from receiving this medicine? Side effects that you should report to your doctor or health care professional as soon as possible: -allergic reactions like skin rash, itching or hives, swelling of the face, lips, or tongue -breathing problems -chest pain or tightness -fast, irregular heartbeat -low blood counts - this medicine may decrease the number of white blood cells, red blood cells and platelets. You may be at increased risk for infections and bleeding. -seizures -signs and symptoms of bleeding such as bloody or black, tarry stools; red or dark-brown urine; spitting up blood or brown material that looks like coffee grounds; red spots on the skin; unusual bruising or bleeding from  the eye, gums, or nose -signs and symptoms of a blood clot such as breathing problems; changes in vision; chest pain; severe, sudden headache; pain, swelling, warmth in the leg; trouble speaking; sudden numbness or weakness of the face, arm or leg -signs and symptoms of liver injury like dark yellow or brown urine; general ill feeling or flu-like symptoms; light-colored stools; loss of appetite; nausea; right upper belly pain; unusually weak or tired; yellowing of the eyes or skin -signs and symptoms of a stroke like changes in vision; confusion; trouble speaking or understanding; severe headaches; sudden numbness or weakness of the face, arm or leg; trouble walking; dizziness; loss of balance or coordination -sweating -vomiting Side effects that usually do not require medical attention (report to your doctor or health care professional if they continue or are bothersome): -constipation -cough -diarrhea -tiredness This list may not describe all possible side effects. Call your doctor for medical advice about side effects. You may report side effects to FDA at 1-800-FDA-1088. Where should I keep my medicine? Keep out of the reach of children. Store at room temperature between 15 and 30 degrees C (59 and 86 degrees F). Throw away any unused medicine after the expiration date. NOTE: This sheet is a summary. It may not cover all possible information. If you have questions about this medicine, talk to your doctor, pharmacist, or health care provider.  2015, [employer]. (2014-02-24 18:30:01) Lenalidomide Oral Capsules What is this medicine? LENALIDOMIDE (len a LID oh mide) is a chemotherapy drug that targets specific proteins within cancer cells and stops the cancer cell from growing. It is used to treat multiple myeloma, mantle cell lymphoma, and some myelodysplastic syndromes that cause severe anemia requiring blood transfusions. This medicine may be used for other purposes; ask your  health care provider or pharmacist if you have questions. COMMON BRAND NAME(S): Revlimid What should I tell my health care provider before I take this medicine? They need to know if you have any of these conditions: -blood clots in the legs or the lungs -high blood pressure -high cholesterol -infection -irregular monthly periods or menstrual cycles -kidney disease -liver disease -smoke tobacco -thyroid disease -an unusual or allergic reaction to lenalidomide, other medicines, foods, dyes, or preservatives -pregnant or trying to get pregnant -breast-feeding How should I use this medicine? Take this medicine by mouth with a glass of water. Follow the directions on the prescription label. Do not cut, crush, or chew this medicine. Take your medicine at regular intervals. Do not take it more often than directed. Do not stop taking except on your doctor's advice. A MedGuide will be given with each prescription and refill.  Read this guide carefully each time. The MedGuide may change frequently. Talk to your pediatrician regarding the use of this medicine in children. Special care may be needed. Overdosage: If you think you have taken too much of this medicine contact a poison control center or emergency room at once. NOTE: This medicine is only for you. Do not share this medicine with others. What if I miss a dose? If you miss a dose, take it as soon as you can. If your next dose is to be taken in less than 12 hours, then do not take the missed dose. Take the next dose at your regular time. Do not take double or extra doses. What may interact with this medicine? This medicine may interact with the following medications: -digoxin -medicines that increase the risk of thrombosis like estrogens or erythropoietic agents (e.g., epoetin alfa and darbepoetin alfa) -warfarin This list may not describe all possible interactions. Give your health care provider a list of all the medicines, herbs,  non-prescription drugs, or dietary supplements you use. Also tell them if you smoke, drink alcohol, or use illegal drugs. Some items may interact with your medicine. What should I watch for while using this medicine? Visit your doctor for regular check ups. Tell your doctor or healthcare professional if your symptoms do not start to get better or if they get worse. You will need to have important blood work done while you are taking this medicine. This medicine is available only through a special program. Doctors, pharmacies, and patients must meet all of the conditions of the program. Your health care provider will help you get signed up with the program if you need this medicine. Through the program you will only receive up to a 28 day supply of the medicine at one time. You will need a new prescription for each refill. This medicine can cause birth defects. Do not get pregnant while taking this drug. Females with child-bearing potential will need to have 2 negative pregnancy tests before starting this medicine. Pregnancy testing must be done every 2 to 4 weeks as directed while taking this medicine. Use 2 reliable forms of birth control together while you are taking this medicine and for 1 month after you stop taking this medicine. If you think that you might be pregnant talk to your doctor right away. Men must use a latex condom during sexual contact with a woman while taking this medicine and for 28 days after you stop taking this medicine. A latex condom is needed even if you have had a vasectomy. Contact your doctor right away if your partner becomes pregnant. Do not donate sperm while taking this medicine and for 28 days after you stop taking this medicine. Do not give blood while taking the medicine and for 1 month after completion of treatment to avoid exposing pregnant women to the medicine through the donated blood. Talk to your doctor about your risk of cancer. You may be more at risk for certain  types of cancers if you take this medicine. What side effects may I notice from receiving this medicine? Side effects that you should report to your doctor or health care professional as soon as possible: -allergic reactions like skin rash, itching or hives, swelling of the face, lips, or tongue -breathing problems -chest pain or tightness -fast, irregular heartbeat -low blood counts - this medicine may decrease the number of white blood cells, red blood cells and platelets. You may be at increased risk for infections and  bleeding. -seizures -signs and symptoms of bleeding such as bloody or black, tarry stools; red or dark-brown urine; spitting up blood or brown material that looks like coffee grounds; red spots on the skin; unusual bruising or bleeding from the eye, gums, or nose -signs and symptoms of a blood clot such as breathing problems; changes in vision; chest pain; severe, sudden headache; pain, swelling, warmth in the leg; trouble speaking; sudden numbness or weakness of the face, arm or leg -signs and symptoms of liver injury like dark yellow or brown urine; general ill feeling or flu-like symptoms; light-colored stools; loss of appetite; nausea; right upper belly pain; unusually weak or tired; yellowing of the eyes or skin -signs and symptoms of a stroke like changes in vision; confusion; trouble speaking or understanding; severe headaches; sudden numbness or weakness of the face, arm or leg; trouble walking; dizziness; loss of balance or coordination -sweating -vomiting Side effects that usually do not require medical attention (report to your doctor or health care professional if they continue or are bothersome): -constipation -cough -diarrhea -tiredness This list may not describe all possible side effects. Call your doctor for medical advice about side effects. You may report side effects to FDA at 1-800-FDA-1088. Where should I keep my medicine? Keep out of the reach of  children. Store at room temperature between 15 and 30 degrees C (59 and 86 degrees F). Throw away any unused medicine after the expiration date. NOTE: This sheet is a summary. It may not cover all possible information. If you have questions about this medicine, talk to your doctor, pharmacist, or health care provider.  2015, [employer]. (2014-02-24 18:30:01) Lenalidomide Oral Capsules What is this medicine? LENALIDOMIDE (len a LID oh mide) is a chemotherapy drug that targets specific proteins within cancer cells and stops the cancer cell from growing. It is used to treat multiple myeloma, mantle cell lymphoma, and some myelodysplastic syndromes that cause severe anemia requiring blood transfusions. This medicine may be used for other purposes; ask your health care provider or pharmacist if you have questions. COMMON BRAND NAME(S): Revlimid What should I tell my health care provider before I take this medicine? They need to know if you have any of these conditions: -blood clots in the legs or the lungs -high blood pressure -high cholesterol -infection -irregular monthly periods or menstrual cycles -kidney disease -liver disease -smoke tobacco -thyroid disease -an unusual or allergic reaction to lenalidomide, other medicines, foods, dyes, or preservatives -pregnant or trying to get pregnant -breast-feeding How should I use this medicine? Take this medicine by mouth with a glass of water. Follow the directions on the prescription label. Do not cut, crush, or chew this medicine. Take your medicine at regular intervals. Do not take it more often than directed. Do not stop taking except on your doctor's advice. A MedGuide will be given with each prescription and refill. Read this guide carefully each time. The MedGuide may change frequently. Talk to your pediatrician regarding the use of this medicine in children. Special care may be needed. Overdosage: If you think you have taken  too much of this medicine contact a poison control center or emergency room at once. NOTE: This medicine is only for you. Do not share this medicine with others. What if I miss a dose? If you miss a dose, take it as soon as you can. If your next dose is to be taken in less than 12 hours, then do not take the missed dose. Take the next  dose at your regular time. Do not take double or extra doses. What may interact with this medicine? This medicine may interact with the following medications: -digoxin -medicines that increase the risk of thrombosis like estrogens or erythropoietic agents (e.g., epoetin alfa and darbepoetin alfa) -warfarin This list may not describe all possible interactions. Give your health care provider a list of all the medicines, herbs, non-prescription drugs, or dietary supplements you use. Also tell them if you smoke, drink alcohol, or use illegal drugs. Some items may interact with your medicine. What should I watch for while using this medicine? Visit your doctor for regular check ups. Tell your doctor or healthcare professional if your symptoms do not start to get better or if they get worse. You will need to have important blood work done while you are taking this medicine. This medicine is available only through a special program. Doctors, pharmacies, and patients must meet all of the conditions of the program. Your health care provider will help you get signed up with the program if you need this medicine. Through the program you will only receive up to a 28 day supply of the medicine at one time. You will need a new prescription for each refill. This medicine can cause birth defects. Do not get pregnant while taking this drug. Females with child-bearing potential will need to have 2 negative pregnancy tests before starting this medicine. Pregnancy testing must be done every 2 to 4 weeks as directed while taking this medicine. Use 2 reliable forms of birth control together  while you are taking this medicine and for 1 month after you stop taking this medicine. If you think that you might be pregnant talk to your doctor right away. Men must use a latex condom during sexual contact with a woman while taking this medicine and for 28 days after you stop taking this medicine. A latex condom is needed even if you have had a vasectomy. Contact your doctor right away if your partner becomes pregnant. Do not donate sperm while taking this medicine and for 28 days after you stop taking this medicine. Do not give blood while taking the medicine and for 1 month after completion of treatment to avoid exposing pregnant women to the medicine through the donated blood. Talk to your doctor about your risk of cancer. You may be more at risk for certain types of cancers if you take this medicine. What side effects may I notice from receiving this medicine? Side effects that you should report to your doctor or health care professional as soon as possible: -allergic reactions like skin rash, itching or hives, swelling of the face, lips, or tongue -breathing problems -chest pain or tightness -fast, irregular heartbeat -low blood counts - this medicine may decrease the number of white blood cells, red blood cells and platelets. You may be at increased risk for infections and bleeding. -seizures -signs and symptoms of bleeding such as bloody or black, tarry stools; red or dark-brown urine; spitting up blood or brown material that looks like coffee grounds; red spots on the skin; unusual bruising or bleeding from the eye, gums, or nose -signs and symptoms of a blood clot such as breathing problems; changes in vision; chest pain; severe, sudden headache; pain, swelling, warmth in the leg; trouble speaking; sudden numbness or weakness of the face, arm or leg -signs and symptoms of liver injury like dark yellow or brown urine; general ill feeling or flu-like symptoms; light-colored stools; loss of  appetite; nausea;  right upper belly pain; unusually weak or tired; yellowing of the eyes or skin -signs and symptoms of a stroke like changes in vision; confusion; trouble speaking or understanding; severe headaches; sudden numbness or weakness of the face, arm or leg; trouble walking; dizziness; loss of balance or coordination -sweating -vomiting Side effects that usually do not require medical attention (report to your doctor or health care professional if they continue or are bothersome): -constipation -cough -diarrhea -tiredness This list may not describe all possible side effects. Call your doctor for medical advice about side effects. You may report side effects to FDA at 1-800-FDA-1088. Where should I keep my medicine? Keep out of the reach of children. Store at room temperature between 15 and 30 degrees C (59 and 86 degrees F). Throw away any unused medicine after the expiration date. NOTE: This sheet is a summary. It may not cover all possible information. If you have questions about this medicine, talk to your doctor, pharmacist, or health care provider.  2015, Elsevier/Gold Standard. (2014-02-24 18:30:01)

## 2014-07-14 ENCOUNTER — Telehealth: Payer: Self-pay | Admitting: Hematology and Oncology

## 2014-07-14 ENCOUNTER — Telehealth: Payer: Self-pay | Admitting: *Deleted

## 2014-07-14 NOTE — Telephone Encounter (Signed)
Pt notified of appointment at [facility] on 8/31 @ 0810 with Dr Nadara Mustard. Confirmed appts @ [facility] as well

## 2014-07-14 NOTE — Telephone Encounter (Signed)
Faxed pt medical records to [facility]. Slides and scans will be fedex'ed °

## 2014-07-17 ENCOUNTER — Ambulatory Visit (HOSPITAL_BASED_OUTPATIENT_CLINIC_OR_DEPARTMENT_OTHER): Payer: BC Managed Care – PPO

## 2014-07-17 DIAGNOSIS — Z5112 Encounter for antineoplastic immunotherapy: Secondary | ICD-10-CM

## 2014-07-17 DIAGNOSIS — C9000 Multiple myeloma not having achieved remission: Secondary | ICD-10-CM

## 2014-07-17 LAB — CHROMOSOME ANALYSIS, BONE MARROW

## 2014-07-17 LAB — TISSUE HYBRIDIZATION (BONE MARROW)-NCBH

## 2014-07-17 MED ORDER — BORTEZOMIB CHEMO SQ INJECTION 3.5 MG (2.5MG/ML)
1.3000 mg/m2 | Freq: Once | INTRAMUSCULAR | Status: AC
  Administered 2014-07-17: 1.75 mg via SUBCUTANEOUS
  Filled 2014-07-17: qty 1.75

## 2014-07-17 MED ORDER — ONDANSETRON HCL 8 MG PO TABS
8.0000 mg | ORAL_TABLET | Freq: Once | ORAL | Status: AC
Start: 2014-07-17 — End: 2014-07-17
  Administered 2014-07-17: 8 mg via ORAL

## 2014-07-17 MED ORDER — ONDANSETRON HCL 8 MG PO TABS
ORAL_TABLET | ORAL | Status: AC
  Filled 2014-07-17: qty 1

## 2014-07-17 NOTE — Patient Instructions (Signed)
[facility] Discharge Instructions for Patients Receiving Chemotherapy  Today you received the following chemotherapy agents: Velcade.  To help prevent nausea and vomiting after your treatment, we encourage you to take your nausea medication as prescribed.   If you develop nausea and vomiting that is not controlled by your nausea medication, call the clinic.   BELOW ARE SYMPTOMS THAT SHOULD BE REPORTED IMMEDIATELY:  *FEVER GREATER THAN 100.5 F  *CHILLS WITH OR WITHOUT FEVER  NAUSEA AND VOMITING THAT IS NOT CONTROLLED WITH YOUR NAUSEA MEDICATION  *UNUSUAL SHORTNESS OF BREATH  *UNUSUAL BRUISING OR BLEEDING  TENDERNESS IN MOUTH AND THROAT WITH OR WITHOUT PRESENCE OF ULCERS  *URINARY PROBLEMS  *BOWEL PROBLEMS  UNUSUAL RASH Items with * indicate a potential emergency and should be followed up as soon as possible.  Feel free to call the clinic you have any questions or concerns. The clinic phone number is [phone].    

## 2014-07-20 ENCOUNTER — Other Ambulatory Visit: Payer: Self-pay | Admitting: Hematology and Oncology

## 2014-07-20 ENCOUNTER — Other Ambulatory Visit (HOSPITAL_BASED_OUTPATIENT_CLINIC_OR_DEPARTMENT_OTHER): Payer: BC Managed Care – PPO

## 2014-07-20 ENCOUNTER — Other Ambulatory Visit: Payer: Self-pay | Admitting: *Deleted

## 2014-07-20 ENCOUNTER — Ambulatory Visit (HOSPITAL_BASED_OUTPATIENT_CLINIC_OR_DEPARTMENT_OTHER): Payer: BC Managed Care – PPO

## 2014-07-20 DIAGNOSIS — C9000 Multiple myeloma not having achieved remission: Secondary | ICD-10-CM

## 2014-07-20 DIAGNOSIS — IMO0002 Reserved for concepts with insufficient information to code with codable children: Secondary | ICD-10-CM

## 2014-07-20 DIAGNOSIS — I10 Essential (primary) hypertension: Secondary | ICD-10-CM

## 2014-07-20 DIAGNOSIS — Z5112 Encounter for antineoplastic immunotherapy: Secondary | ICD-10-CM

## 2014-07-20 LAB — CBC WITH DIFFERENTIAL/PLATELET
BASO%: 0.4 % (ref 0.0–2.0)
BASOS ABS: 0 10*3/uL (ref 0.0–0.1)
EOS%: 0 % (ref 0.0–7.0)
Eosinophils Absolute: 0 10*3/uL (ref 0.0–0.5)
HEMATOCRIT: 29.7 % — AB (ref 34.8–46.6)
HEMOGLOBIN: 9.9 g/dL — AB (ref 11.6–15.9)
LYMPH%: 3.6 % — AB (ref 14.0–49.7)
MCH: 31.8 pg (ref 25.1–34.0)
MCHC: 33.2 g/dL (ref 31.5–36.0)
MCV: 95.9 fL (ref 79.5–101.0)
MONO#: 0 10*3/uL — ABNORMAL LOW (ref 0.1–0.9)
MONO%: 0.4 % (ref 0.0–14.0)
NEUT#: 5.7 10*3/uL (ref 1.5–6.5)
NEUT%: 95.6 % — AB (ref 38.4–76.8)
Platelets: 223 10*3/uL (ref 145–400)
RBC: 3.1 10*6/uL — ABNORMAL LOW (ref 3.70–5.45)
RDW: 14.3 % (ref 11.2–14.5)
WBC: 5.9 10*3/uL (ref 3.9–10.3)
lymph#: 0.2 10*3/uL — ABNORMAL LOW (ref 0.9–3.3)

## 2014-07-20 LAB — COMPREHENSIVE METABOLIC PANEL (CC13)
ALT: 41 U/L (ref 0–55)
AST: 16 U/L (ref 5–34)
Albumin: 3.6 g/dL (ref 3.5–5.0)
Alkaline Phosphatase: 96 U/L (ref 40–150)
Anion Gap: 11 mEq/L (ref 3–11)
BILIRUBIN TOTAL: 0.23 mg/dL (ref 0.20–1.20)
BUN: 25.9 mg/dL (ref 7.0–26.0)
CALCIUM: 10.2 mg/dL (ref 8.4–10.4)
CHLORIDE: 100 meq/L (ref 98–109)
CO2: 26 meq/L (ref 22–29)
Creatinine: 1.8 mg/dL — ABNORMAL HIGH (ref 0.6–1.1)
GLUCOSE: 142 mg/dL — AB (ref 70–140)
Potassium: 4 mEq/L (ref 3.5–5.1)
Sodium: 137 mEq/L (ref 136–145)
Total Protein: 8.1 g/dL (ref 6.4–8.3)

## 2014-07-20 MED ORDER — AMLODIPINE BESYLATE 10 MG PO TABS: 10.0000 mg | ORAL_TABLET | Freq: Every day | ORAL | Status: DC

## 2014-07-20 MED ORDER — ONDANSETRON HCL 8 MG PO TABS
ORAL_TABLET | ORAL | Status: AC
  Filled 2014-07-20: qty 1

## 2014-07-20 MED ORDER — ONDANSETRON HCL 8 MG PO TABS
8.0000 mg | ORAL_TABLET | Freq: Once | ORAL | Status: AC
  Administered 2014-07-20: 8 mg via ORAL

## 2014-07-20 MED ORDER — BORTEZOMIB CHEMO SQ INJECTION 3.5 MG (2.5MG/ML)
1.3000 mg/m2 | Freq: Once | INTRAMUSCULAR | Status: AC
  Administered 2014-07-20: 1.75 mg via SUBCUTANEOUS
  Filled 2014-07-20: qty 1.75

## 2014-07-20 NOTE — Progress Notes (Signed)
Discharged at 1501, ambulatory in no distress.

## 2014-07-20 NOTE — Telephone Encounter (Signed)
Message received from provider to order this medicine.  Called patient's mobile number and message left with information about this order.  Reached her at home and verbally notified her as well.

## 2014-07-20 NOTE — Progress Notes (Signed)
B/P re-checked after Velcade injection

## 2014-07-20 NOTE — Progress Notes (Signed)
Reports having taken steroid this morning at 0700.  Vital signs reveal increased b/p today.  Avalide medication has been "suspended by nephrologist" per patient.  Denies headache or changes in vision.  "I feel good and the she has a device at home but has not used it to check her b/p.  This nurse asked that she check her b/p.  And to call if it rises or she feels any new symptoms.  In-Basket message sent to notify Dr. Alvy Bimler.

## 2014-07-20 NOTE — Patient Instructions (Signed)
[facility] Discharge Instructions for Patients Receiving Chemotherapy  Today you received the following chemotherapy agents Velcade  To help prevent nausea and vomiting after your treatment, we encourage you to take your nausea medication Phenergan 25 mg as ordered.   If you develop nausea and vomiting that is not controlled by your nausea medication, call the clinic.   BELOW ARE SYMPTOMS THAT SHOULD BE REPORTED IMMEDIATELY:  *FEVER GREATER THAN 100.5 F  *CHILLS WITH OR WITHOUT FEVER  NAUSEA AND VOMITING THAT IS NOT CONTROLLED WITH YOUR NAUSEA MEDICATION  *UNUSUAL SHORTNESS OF BREATH  *UNUSUAL BRUISING OR BLEEDING  TENDERNESS IN MOUTH AND THROAT WITH OR WITHOUT PRESENCE OF ULCERS  *URINARY PROBLEMS  *BOWEL PROBLEMS  UNUSUAL RASH Items with * indicate a potential emergency and should be followed up as soon as possible.  Feel free to call the clinic you have any questions or concerns. The clinic phone number is [phone].

## 2014-07-21 ENCOUNTER — Telehealth: Payer: Self-pay | Admitting: *Deleted

## 2014-07-21 NOTE — Telephone Encounter (Signed)
Called Dr. Estanislado Pandy and gave him information for consult.  He will give information to his Scheduler to call pt w/ appointment.

## 2014-07-21 NOTE — Telephone Encounter (Signed)
Message copied by Cathlean Cower on Tue Jul 21, 2014 12:03 PM ------      Message from: [facility], [place]      Created: Tue Jul 21, 2014  9:22 AM      Regarding: RE: Dr. Estanislado Pandy       Yes, please and if it works out, cancel our MRI      ----- Message -----         From: Cathlean Cower, RN         Sent: 07/21/2014   9:02 AM           To: Heath Lark, MD      Subject: Dr. Estanislado Pandy                                            Pt's friend, Manus Gunning, left a VM.  Apparently her husband works for Dr. Estanislado Pandy and says if you call his office at [phone] for consult, he would be able to get pt's MRI and vertebroplasty  Within a  2 to 3 days.   The MRI you ordered is scheduled for 8/25, but apparently Dr. Estanislado Pandy can get it done sooner.  Do you want me to call in a referral?        ------

## 2014-07-28 ENCOUNTER — Ambulatory Visit (HOSPITAL_COMMUNITY): Admission: RE | Admit: 2014-07-28 | Payer: BC Managed Care – PPO | Source: Ambulatory Visit

## 2014-07-28 ENCOUNTER — Ambulatory Visit (HOSPITAL_COMMUNITY)
Admission: RE | Admit: 2014-07-28 | Discharge: 2014-07-28 | Disposition: A | Discharge status: Home / Self Care | Payer: BC Managed Care – PPO | Source: Ambulatory Visit | Attending: Hematology and Oncology | Admitting: Hematology and Oncology

## 2014-07-28 ENCOUNTER — Other Ambulatory Visit: Payer: Self-pay | Admitting: Hematology and Oncology

## 2014-07-28 DIAGNOSIS — M899 Disorder of bone, unspecified: Secondary | ICD-10-CM | POA: Insufficient documentation

## 2014-07-28 DIAGNOSIS — IMO0002 Reserved for concepts with insufficient information to code with codable children: Secondary | ICD-10-CM

## 2014-07-28 DIAGNOSIS — C9000 Multiple myeloma not having achieved remission: Secondary | ICD-10-CM

## 2014-07-28 DIAGNOSIS — M949 Disorder of cartilage, unspecified: Secondary | ICD-10-CM | POA: Diagnosis not present

## 2014-07-28 DIAGNOSIS — M8448XA Pathological fracture, other site, initial encounter for fracture: Secondary | ICD-10-CM | POA: Diagnosis present

## 2014-07-28 MED ORDER — GADOBENATE DIMEGLUMINE 529 MG/ML IV SOLN
5.0000 mL | Freq: Once | INTRAVENOUS | Status: AC | PRN
  Administered 2014-07-28: 5 mL via INTRAVENOUS

## 2014-07-29 ENCOUNTER — Telehealth: Payer: Self-pay | Admitting: *Deleted

## 2014-07-29 NOTE — Telephone Encounter (Signed)
Pt left VM requests [facility] pharmacy [facility] [address] [facility] [address]  She requests all new rx [facility] [address].  Pharmacy changed in system.

## 2014-07-31 ENCOUNTER — Other Ambulatory Visit: Payer: Self-pay | Admitting: *Deleted

## 2014-07-31 ENCOUNTER — Encounter: Payer: Self-pay | Admitting: Hematology and Oncology

## 2014-07-31 ENCOUNTER — Telehealth: Payer: Self-pay | Admitting: Hematology and Oncology

## 2014-07-31 ENCOUNTER — Other Ambulatory Visit (HOSPITAL_BASED_OUTPATIENT_CLINIC_OR_DEPARTMENT_OTHER): Payer: BC Managed Care – PPO

## 2014-07-31 ENCOUNTER — Ambulatory Visit (HOSPITAL_BASED_OUTPATIENT_CLINIC_OR_DEPARTMENT_OTHER): Payer: BC Managed Care – PPO

## 2014-07-31 ENCOUNTER — Ambulatory Visit (HOSPITAL_BASED_OUTPATIENT_CLINIC_OR_DEPARTMENT_OTHER): Payer: BC Managed Care – PPO | Admitting: Hematology and Oncology

## 2014-07-31 VITALS — BP 136/71 | HR 106 | Temp 98.7°F | Resp 18 | Ht 60.25 in | Wt 102.2 lb

## 2014-07-31 DIAGNOSIS — Z5112 Encounter for antineoplastic immunotherapy: Secondary | ICD-10-CM

## 2014-07-31 DIAGNOSIS — D630 Anemia in neoplastic disease: Secondary | ICD-10-CM

## 2014-07-31 DIAGNOSIS — C9000 Multiple myeloma not having achieved remission: Secondary | ICD-10-CM

## 2014-07-31 DIAGNOSIS — M898X9 Other specified disorders of bone, unspecified site: Secondary | ICD-10-CM

## 2014-07-31 DIAGNOSIS — IMO0002 Reserved for concepts with insufficient information to code with codable children: Secondary | ICD-10-CM

## 2014-07-31 DIAGNOSIS — N179 Acute kidney failure, unspecified: Secondary | ICD-10-CM

## 2014-07-31 LAB — CBC WITH DIFFERENTIAL/PLATELET
BASO%: 0.1 % (ref 0.0–2.0)
Basophils Absolute: 0 10*3/uL (ref 0.0–0.1)
EOS%: 0 % (ref 0.0–7.0)
Eosinophils Absolute: 0 10*3/uL (ref 0.0–0.5)
HEMATOCRIT: 28 % — AB (ref 34.8–46.6)
HGB: 9.1 g/dL — ABNORMAL LOW (ref 11.6–15.9)
LYMPH#: 0.2 10*3/uL — AB (ref 0.9–3.3)
LYMPH%: 2.6 % — ABNORMAL LOW (ref 14.0–49.7)
MCH: 31.9 pg (ref 25.1–34.0)
MCHC: 32.5 g/dL (ref 31.5–36.0)
MCV: 98.2 fL (ref 79.5–101.0)
MONO#: 0 10*3/uL — AB (ref 0.1–0.9)
MONO%: 0.3 % (ref 0.0–14.0)
NEUT#: 7 10*3/uL — ABNORMAL HIGH (ref 1.5–6.5)
NEUT%: 97 % — ABNORMAL HIGH (ref 38.4–76.8)
Platelets: 268 10*3/uL (ref 145–400)
RBC: 2.85 10*6/uL — AB (ref 3.70–5.45)
RDW: 14.6 % — ABNORMAL HIGH (ref 11.2–14.5)
WBC: 7.2 10*3/uL (ref 3.9–10.3)

## 2014-07-31 LAB — COMPREHENSIVE METABOLIC PANEL (CC13)
ALT: 24 U/L (ref 0–55)
ANION GAP: 10 meq/L (ref 3–11)
AST: 16 U/L (ref 5–34)
Albumin: 3.7 g/dL (ref 3.5–5.0)
Alkaline Phosphatase: 112 U/L (ref 40–150)
BUN: 18.4 mg/dL (ref 7.0–26.0)
CALCIUM: 10 mg/dL (ref 8.4–10.4)
CHLORIDE: 102 meq/L (ref 98–109)
CO2: 27 mEq/L (ref 22–29)
Creatinine: 1.6 mg/dL — ABNORMAL HIGH (ref 0.6–1.1)
Glucose: 144 mg/dl — ABNORMAL HIGH (ref 70–140)
Potassium: 3.8 mEq/L (ref 3.5–5.1)
SODIUM: 139 meq/L (ref 136–145)
TOTAL PROTEIN: 7.9 g/dL (ref 6.4–8.3)
Total Bilirubin: 0.36 mg/dL (ref 0.20–1.20)

## 2014-07-31 MED ORDER — LENALIDOMIDE 10 MG PO CAPS: 10.0000 mg | ORAL_CAPSULE | Freq: Every day | ORAL | Status: DC

## 2014-07-31 MED ORDER — HYDROMORPHONE HCL 2 MG PO TABS: 2.0000 mg | ORAL_TABLET | ORAL | Status: DC | PRN

## 2014-07-31 MED ORDER — BORTEZOMIB CHEMO SQ INJECTION 3.5 MG (2.5MG/ML)
1.3000 mg/m2 | Freq: Once | INTRAMUSCULAR | Status: AC
  Administered 2014-07-31: 1.75 mg via SUBCUTANEOUS
  Filled 2014-07-31: qty 1.75

## 2014-07-31 MED ORDER — ONDANSETRON HCL 8 MG PO TABS
ORAL_TABLET | ORAL | Status: AC
  Filled 2014-07-31: qty 1

## 2014-07-31 MED ORDER — ONDANSETRON HCL 8 MG PO TABS
8.0000 mg | ORAL_TABLET | Freq: Once | ORAL | Status: AC
  Administered 2014-07-31: 8 mg via ORAL

## 2014-07-31 NOTE — Patient Instructions (Signed)
Lenalidomide Oral Capsules What is this medicine? LENALIDOMIDE (len a LID oh mide) is a chemotherapy drug that targets specific proteins within cancer cells and stops the cancer cell from growing. It is used to treat multiple myeloma, mantle cell lymphoma, and some myelodysplastic syndromes that cause severe anemia requiring blood transfusions. This medicine may be used for other purposes; ask your health care provider or pharmacist if you have questions. COMMON BRAND NAME(S): Revlimid What should I tell my health care provider before I take this medicine? They need to know if you have any of these conditions: -blood clots in the legs or the lungs -high blood pressure -high cholesterol -infection -irregular monthly periods or menstrual cycles -kidney disease -liver disease -smoke tobacco -thyroid disease -an unusual or allergic reaction to lenalidomide, other medicines, foods, dyes, or preservatives -pregnant or trying to get pregnant -breast-feeding How should I use this medicine? Take this medicine by mouth with a glass of water. Follow the directions on the prescription label. Do not cut, crush, or chew this medicine. Take your medicine at regular intervals. Do not take it more often than directed. Do not stop taking except on your doctor's advice. A MedGuide will be given with each prescription and refill. Read this guide carefully each time. The MedGuide may change frequently. Talk to your pediatrician regarding the use of this medicine in children. Special care may be needed. Overdosage: If you think you have taken too much of this medicine contact a poison control center or emergency room at once. NOTE: This medicine is only for you. Do not share this medicine with others. What if I miss a dose? If you miss a dose, take it as soon as you can. If your next dose is to be taken in less than 12 hours, then do not take the missed dose. Take the next dose at your regular time. Do not take  double or extra doses. What may interact with this medicine? This medicine may interact with the following medications: -digoxin -medicines that increase the risk of thrombosis like estrogens or erythropoietic agents (e.g., epoetin alfa and darbepoetin alfa) -warfarin This list may not describe all possible interactions. Give your health care provider a list of all the medicines, herbs, non-prescription drugs, or dietary supplements you use. Also tell them if you smoke, drink alcohol, or use illegal drugs. Some items may interact with your medicine. What should I watch for while using this medicine? Visit your doctor for regular check ups. Tell your doctor or healthcare professional if your symptoms do not start to get better or if they get worse. You will need to have important blood work done while you are taking this medicine. This medicine is available only through a special program. Doctors, pharmacies, and patients must meet all of the conditions of the program. Your health care provider will help you get signed up with the program if you need this medicine. Through the program you will only receive up to a 28 day supply of the medicine at one time. You will need a new prescription for each refill. This medicine can cause birth defects. Do not get pregnant while taking this drug. Females with child-bearing potential will need to have 2 negative pregnancy tests before starting this medicine. Pregnancy testing must be done every 2 to 4 weeks as directed while taking this medicine. Use 2 reliable forms of birth control together while you are taking this medicine and for 1 month after you stop taking this medicine. If you  think that you might be pregnant talk to your doctor right away. Men must use a latex condom during sexual contact with a woman while taking this medicine and for 28 days after you stop taking this medicine. A latex condom is needed even if you have had a vasectomy. Contact your doctor  right away if your partner becomes pregnant. Do not donate sperm while taking this medicine and for 28 days after you stop taking this medicine. Do not give blood while taking the medicine and for 1 month after completion of treatment to avoid exposing pregnant women to the medicine through the donated blood. Talk to your doctor about your risk of cancer. You may be more at risk for certain types of cancers if you take this medicine. What side effects may I notice from receiving this medicine? Side effects that you should report to your doctor or health care professional as soon as possible: -allergic reactions like skin rash, itching or hives, swelling of the face, lips, or tongue -breathing problems -chest pain or tightness -fast, irregular heartbeat -low blood counts - this medicine may decrease the number of white blood cells, red blood cells and platelets. You may be at increased risk for infections and bleeding. -seizures -signs and symptoms of bleeding such as bloody or black, tarry stools; red or dark-brown urine; spitting up blood or brown material that looks like coffee grounds; red spots on the skin; unusual bruising or bleeding from the eye, gums, or nose -signs and symptoms of a blood clot such as breathing problems; changes in vision; chest pain; severe, sudden headache; pain, swelling, warmth in the leg; trouble speaking; sudden numbness or weakness of the face, arm or leg -signs and symptoms of liver injury like dark yellow or brown urine; general ill feeling or flu-like symptoms; light-colored stools; loss of appetite; nausea; right upper belly pain; unusually weak or tired; yellowing of the eyes or skin -signs and symptoms of a stroke like changes in vision; confusion; trouble speaking or understanding; severe headaches; sudden numbness or weakness of the face, arm or leg; trouble walking; dizziness; loss of balance or coordination -sweating -vomiting Side effects that usually do  not require medical attention (report to your doctor or health care professional if they continue or are bothersome): -constipation -cough -diarrhea -tiredness This list may not describe all possible side effects. Call your doctor for medical advice about side effects. You may report side effects to FDA at 1-800-FDA-1088. Where should I keep my medicine? Keep out of the reach of children. Store at room temperature between 15 and 30 degrees C (59 and 86 degrees F). Throw away any unused medicine after the expiration date. NOTE: This sheet is a summary. It may not cover all possible information. If you have questions about this medicine, talk to your doctor, pharmacist, or health care provider.  2015, Elsevier/Gold Standard. (2014-02-24 18:30:01)

## 2014-07-31 NOTE — Progress Notes (Signed)
Faxed revlimid prescription to [facility]

## 2014-07-31 NOTE — Telephone Encounter (Signed)
gv and printed appt sched and avs for pt for Sept.....sed added tx °

## 2014-07-31 NOTE — Patient Instructions (Signed)
[facility] Discharge Instructions for Patients Receiving Chemotherapy  Today you received the following chemotherapy agents Velcade  To help prevent nausea and vomiting after your treatment, we encourage you to take your nausea medication Promethazine 25 mg by mouth as needed every 6 hourss for nausea   If you develop nausea and vomiting that is not controlled by your nausea medication, call the clinic.   BELOW ARE SYMPTOMS THAT SHOULD BE REPORTED IMMEDIATELY:  *FEVER GREATER THAN 100.5 F  *CHILLS WITH OR WITHOUT FEVER  NAUSEA AND VOMITING THAT IS NOT CONTROLLED WITH YOUR NAUSEA MEDICATION  *UNUSUAL SHORTNESS OF BREATH  *UNUSUAL BRUISING OR BLEEDING  TENDERNESS IN MOUTH AND THROAT WITH OR WITHOUT PRESENCE OF ULCERS  *URINARY PROBLEMS  *BOWEL PROBLEMS  UNUSUAL RASH Items with * indicate a potential emergency and should be followed up as soon as possible.  Feel free to call the clinic you have any questions or concerns. The clinic phone number is [phone].

## 2014-07-31 NOTE — Telephone Encounter (Signed)
Pt enrolled in [employer] [facility].[name] #[id number].  Rx for Revlimid given to [name] in managed care dept for prior auth.Marland Kitchen

## 2014-07-31 NOTE — Progress Notes (Signed)
Pt's Creatine  Level is 1.6 today. Labs Reviewed with Cameo P.  Cameo consulted Dr. Alvy Bimler. Cameo P stated Dr. Tish Men verbal consent to give Velcade Sq today with Creatine level at 1.6.

## 2014-08-02 DIAGNOSIS — D630 Anemia in neoplastic disease: Secondary | ICD-10-CM | POA: Insufficient documentation

## 2014-08-02 DIAGNOSIS — N179 Acute kidney failure, unspecified: Secondary | ICD-10-CM | POA: Insufficient documentation

## 2014-08-02 NOTE — Assessment & Plan Note (Signed)
I refilled her prescription pain medicine. I warned her about side effects and we discussed narcotic refill policy.

## 2014-08-02 NOTE — Progress Notes (Signed)
[facility] OFFICE PROGRESS NOTE  Patient Care Team: Catha Gosselin, MD as PCP - General (Family Medicine) Coletta Memos, MD as Consulting Physician (Neurosurgery)  SUMMARY OF ONCOLOGIC HISTORY: Oncology History   Multiple myeloma without remission; Calcium 13.3, Creatinine 2.83, Hg 8.3, Bone lesions present, Beta 2 microglobulin 12.8, Albumin 3.5, IgA 1760 and kappa light chains 1670. Bone marrow biopsy showed 82% involvement.   Primary site: Multiple Myeloma   Staging method: AJCC 6th Edition   Clinical: Stage IIIB signed by Artis Delay, MD on 07/13/2014 12:38 PM   Summary: Stage IIIB       Multiple myeloma without remission   07/07/2014 - 07/10/2014 Hospital Admission She was admitted to the hospital for management of malignant hypercalcemia and had bone marrow biopsy which confirmed diagnosis of multiple myeloma. The patient received one unit of blood transfusion due to severe anemia.   07/08/2014 Bone Marrow Biopsy The patient had bone marrow biopsy that confirmed diagnosis.   07/08/2014 Pathology Results Accession: [id number] showed 82% involvement of bone marrow.   07/08/2014 -  Chemotherapy She was started on Velcade along with dexamethasone.    INTERVAL HISTORY: Please see below for problem oriented charting. She is seen prior to cycle 2 of chemotherapy. Overall, she continues to feel better. Her bone pain is improving. REVIEW OF SYSTEMS:   Constitutional: Denies fevers, chills or abnormal weight loss Eyes: Denies blurriness of vision Ears, nose, mouth, throat, and face: Denies mucositis or sore throat Respiratory: Denies cough, dyspnea or wheezes Cardiovascular: Denies palpitation, chest discomfort or lower extremity swelling Gastrointestinal:  Denies nausea, heartburn or change in bowel habits Skin: Denies abnormal skin rashes Lymphatics: Denies new lymphadenopathy or easy bruising Neurological:Denies numbness, tingling or new weaknesses Behavioral/Psych: Mood is  stable, no new changes  All other systems were reviewed with the patient and are negative.  I have reviewed the past medical history, past surgical history, social history and family history with the patient and they are unchanged from previous note.  ALLERGIES:  is allergic to ace inhibitors; hydrocodone; and cephalexin.  MEDICATIONS:  Current Outpatient Prescriptions  Medication Sig Dispense Refill  . acyclovir (ZOVIRAX) 400 MG tablet Take 1 tablet (400 mg total) by mouth daily.  30 tablet  6  . amLODipine (NORVASC) 10 MG tablet Take 1 tablet (10 mg total) by mouth daily.  30 tablet  1  . atorvastatin (LIPITOR) 10 MG tablet Take 10 mg by mouth every morning.       . cholecalciferol (VITAMIN D) 1000 UNITS tablet Take 2,000 Units by mouth every morning.       Marland Kitchen dexamethasone (DECADRON) 4 MG tablet Twice a week on days of chemo  90 tablet  1  . HYDROmorphone (DILAUDID) 2 MG tablet Take 1 tablet (2 mg total) by mouth every 4 (four) hours as needed for severe pain.  90 tablet  0  . irbesartan-hydrochlorothiazide (AVALIDE) 300-12.5 MG per tablet       . lenalidomide (REVLIMID) 10 MG capsule Take 1 capsule (10 mg total) by mouth daily.  14 capsule  0  . omeprazole (PRILOSEC) 20 MG capsule Take 20 mg by mouth every morning.       . polyvinyl alcohol (LIQUIFILM TEARS) 1.4 % ophthalmic solution Place 1-2 drops into both eyes 2 (two) times daily as needed for dry eyes.       . promethazine (PHENERGAN) 25 MG tablet Take 1 tablet (25 mg total) by mouth every 6 (six) hours as needed for nausea  or vomiting.  60 tablet  3  . senna-docusate (SENOKOT-S) 8.6-50 MG per tablet Take 2 tablets by mouth 2 (two) times daily.  60 tablet  3  . traMADol (ULTRAM) 50 MG tablet Take 50 mg by mouth every 12 (twelve) hours as needed (pain.). 0400 and 1600.      . ciprofloxacin (CIPRO) 500 MG tablet Take 1 tablet (500 mg total) by mouth 2 (two) times daily.  14 tablet  0   No current facility-administered medications for  this visit.    PHYSICAL EXAMINATION: ECOG PERFORMANCE STATUS: 1 - Symptomatic but completely ambulatory  Filed Vitals:   07/31/14 1219  BP: 136/71  Pulse: 106  Temp: 98.7 F (37.1 C)  Resp: 18   Filed Weights   07/31/14 1219  Weight: 102 lb 3.2 oz (46.358 kg)    GENERAL:alert, no distress and comfortable. She looks thin but not cachectic SKIN: skin color, texture, turgor are normal, no rashes or significant lesions EYES: normal, Conjunctiva are pink and non-injected, sclera clear OROPHARYNX:no exudate, no erythema and lips, buccal mucosa, and tongue normal  NECK: supple, thyroid normal size, non-tender, without nodularity LYMPH:  no palpable lymphadenopathy in the cervical, axillary or inguinal LUNGS: clear to auscultation and percussion with normal breathing effort HEART: regular rate & rhythm and no murmurs and no lower extremity edema ABDOMEN:abdomen soft, non-tender and normal bowel sounds Musculoskeletal:no cyanosis of digits and no clubbing  NEURO: alert & oriented x 3 with fluent speech, no focal motor/sensory deficits  LABORATORY DATA:  I have reviewed the data as listed    Component Value Date/Time   NA 139 07/31/2014 1156   NA 136 07/13/2014 1222   K 3.8 07/31/2014 1156   K 3.3* 07/13/2014 1222   CL 100 07/13/2014 1222   CO2 27 07/31/2014 1156   CO2 24 07/13/2014 1222   GLUCOSE 144* 07/31/2014 1156   GLUCOSE 91 07/13/2014 1222   BUN 18.4 07/31/2014 1156   BUN 23 07/13/2014 1222   CREATININE 1.6* 07/31/2014 1156   CREATININE 2.05* 07/13/2014 1222   CALCIUM 10.0 07/31/2014 1156   CALCIUM 10.4 07/13/2014 1222   PROT 7.9 07/31/2014 1156   PROT 7.2 07/13/2014 1222   ALBUMIN 3.7 07/31/2014 1156   ALBUMIN 3.9 07/13/2014 1222   AST 16 07/31/2014 1156   AST 22 07/13/2014 1222   ALT 24 07/31/2014 1156   ALT 75* 07/13/2014 1222   ALKPHOS 112 07/31/2014 1156   ALKPHOS 90 07/13/2014 1222   BILITOT 0.36 07/31/2014 1156   BILITOT 0.4 07/13/2014 1222   GFRNONAA 20* 07/10/2014 0445   GFRAA  23* 07/10/2014 0445    No results found for this basename: SPEP, UPEP,  kappa and lambda light chains    Lab Results  Component Value Date   WBC 7.2 07/31/2014   NEUTROABS 7.0* 07/31/2014   HGB 9.1* 07/31/2014   HCT 28.0* 07/31/2014   MCV 98.2 07/31/2014   PLT 268 07/31/2014      Chemistry      Component Value Date/Time   NA 139 07/31/2014 1156   NA 136 07/13/2014 1222   K 3.8 07/31/2014 1156   K 3.3* 07/13/2014 1222   CL 100 07/13/2014 1222   CO2 27 07/31/2014 1156   CO2 24 07/13/2014 1222   BUN 18.4 07/31/2014 1156   BUN 23 07/13/2014 1222   CREATININE 1.6* 07/31/2014 1156   CREATININE 2.05* 07/13/2014 1222      Component Value Date/Time   CALCIUM 10.0 07/31/2014 1156  CALCIUM 10.4 07/13/2014 1222   ALKPHOS 112 07/31/2014 1156   ALKPHOS 90 07/13/2014 1222   AST 16 07/31/2014 1156   AST 22 07/13/2014 1222   ALT 24 07/31/2014 1156   ALT 75* 07/13/2014 1222   BILITOT 0.36 07/31/2014 1156   BILITOT 0.4 07/13/2014 1222       RADIOGRAPHIC STUDIES: Reviewed the MRI imaging with her and her friend. I have personally reviewed the radiological images as listed and agreed with the findings in the report.   ASSESSMENT & PLAN:  Multiple myeloma without remission Overall, she tolerated her treatment well. The serum creatinine continues to improve. We discussed the role of chemotherapy. The intent is for palliative.  We discussed some of the risks, benefits, side-effects of Revlimid.   Some of the short term side-effects included, though not limited to, risk of fatigue, weight loss, pancytopenia, life-threatening infections, need for transfusions of blood products, nausea, vomiting, change in bowel habits, blood clots, admission to hospital for various reasons, and risks of death.   Long term side-effects are also discussed including risks of infertility, permanent damage to nerve function, chronic fatigue, and rare secondary malignancy including bone marrow disorders such as acute leukemia.    The patient is aware that the response rates discussed earlier is not guaranteed.    After a long discussion, patient made an informed decision to proceed with the prescribed plan of care and went ahead to sign the consent form today.   Patient education material was dispensed I plan to add Revlimid to her regimen. I hope that her serum creatinine we'll continue to improve and I plan on adding Zometa with the next visit.   Hypercalcemia These test results since start of treatment.  Compression fracture We reviewed the MRI in great detail. The patient will proceed with kyphoplasty if this is an appropriate treatment per interventional radiologists.  Bone pain I refilled her prescription pain medicine. I warned her about side effects and we discussed narcotic refill policy.    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 The patient denies recent history of bleeding such as epistaxis, hematuria or hematochezia. She is asymptomatic from the anemia. I will observe for now.  She does not require transfusion now. I will continue the chemotherapy at current dose without dosage adjustment.  If the anemia gets progressive worse in the future, I might have to delay her treatment or adjust the chemotherapy dose.   Acute renal failure This continues to improve since treatment for multiple myeloma was noted. I continue to encourage her to increase oral fluid intake as tolerated.   Orders Placed This Encounter  Procedures  . SPEP & IFE with QIG    Standing Status: Future     Number of Occurrences:      Standing Expiration Date: 09/04/2015  . Kappa/lambda light chains    Standing Status: Future     Number of Occurrences:      Standing Expiration Date: 09/04/2015   All questions were answered. The patient knows to call the clinic with any problems, questions or concerns. No barriers to learning was detected. I spent 30 minutes counseling the patient face to face.  The total time spent in the appointment was 40 minutes and more than 50% was on counseling and review of test results     [facility], Kirkland Figg, MD 08/02/2014 9:14 PM

## 2014-08-02 NOTE — Assessment & Plan Note (Signed)

## 2014-08-02 NOTE — Assessment & Plan Note (Signed)
We reviewed the MRI in great detail. The patient will proceed with kyphoplasty if this is an appropriate treatment per interventional radiologists.

## 2014-08-02 NOTE — Assessment & Plan Note (Signed)
These test results since start of treatment.

## 2014-08-02 NOTE — Assessment & Plan Note (Signed)
Overall, she tolerated her treatment well. The serum creatinine continues to improve. We discussed the role of chemotherapy. The intent is for palliative.  We discussed some of the risks, benefits, side-effects of Revlimid.   Some of the short term side-effects included, though not limited to, risk of fatigue, weight loss, pancytopenia, life-threatening infections, need for transfusions of blood products, nausea, vomiting, change in bowel habits, blood clots, admission to hospital for various reasons, and risks of death.   Long term side-effects are also discussed including risks of infertility, permanent damage to nerve function, chronic fatigue, and rare secondary malignancy including bone marrow disorders such as acute leukemia.   The patient is aware that the response rates discussed earlier is not guaranteed.    After a long discussion, patient made an informed decision to proceed with the prescribed plan of care and went ahead to sign the consent form today.   Patient education material was dispensed I plan to add Revlimid to her regimen. I hope that her serum creatinine we'll continue to improve and I plan on adding Zometa with the next visit.

## 2014-08-02 NOTE — Assessment & Plan Note (Signed)
This continues to improve since treatment for multiple myeloma was noted. I continue to encourage her to increase oral fluid intake as tolerated.

## 2014-08-03 ENCOUNTER — Telehealth (HOSPITAL_COMMUNITY): Payer: Self-pay | Admitting: Interventional Radiology

## 2014-08-03 NOTE — Telephone Encounter (Signed)
Called pt's home phone and cell phone and left VM on both for her to call to schedule consult for back procedure. JM

## 2014-08-04 ENCOUNTER — Telehealth (HOSPITAL_COMMUNITY): Payer: Self-pay | Admitting: Interventional Radiology

## 2014-08-04 ENCOUNTER — Ambulatory Visit (HOSPITAL_BASED_OUTPATIENT_CLINIC_OR_DEPARTMENT_OTHER): Payer: BC Managed Care – PPO

## 2014-08-04 DIAGNOSIS — C9000 Multiple myeloma not having achieved remission: Secondary | ICD-10-CM

## 2014-08-04 DIAGNOSIS — Z5112 Encounter for antineoplastic immunotherapy: Secondary | ICD-10-CM

## 2014-08-04 MED ORDER — ONDANSETRON HCL 8 MG PO TABS
8.0000 mg | ORAL_TABLET | Freq: Once | ORAL | Status: AC
  Administered 2014-08-04: 8 mg via ORAL

## 2014-08-04 MED ORDER — ONDANSETRON HCL 8 MG PO TABS
ORAL_TABLET | ORAL | Status: AC
  Filled 2014-08-04: qty 1

## 2014-08-04 MED ORDER — BORTEZOMIB CHEMO SQ INJECTION 3.5 MG (2.5MG/ML)
1.3000 mg/m2 | Freq: Once | INTRAMUSCULAR | Status: AC
  Administered 2014-08-04: 1.75 mg via SUBCUTANEOUS
  Filled 2014-08-04: qty 0.7

## 2014-08-04 NOTE — Telephone Encounter (Signed)
Called pt, left VM for her to call to schedule consult. JM

## 2014-08-04 NOTE — Progress Notes (Signed)
Patient experienced intense burning pain in her right breast on Saturday morning.  The intense pain lasted 24 hrs. And then she described it as medium pain for 24hrs and then began to subside on Monday.  She saw Dr. Nadara Mustard at [facility] on Monday. She did not see anything, but asked the patient to be aware of the possibility of shingles, which she has had before.  She is already on acyclovir.  Patient states she is fine today and does not need anything else. Patient states she did take her decadron this am (10 tablets).

## 2014-08-04 NOTE — Patient Instructions (Signed)
[facility] Discharge Instructions for Patients Receiving Chemotherapy  Today you received the following chemotherapy agents Velcade  To help prevent nausea and vomiting after your treatment, we encourage you to take your nausea medication Promethazine 25 mg by mouth as needed every 6 hourss for nausea   If you develop nausea and vomiting that is not controlled by your nausea medication, call the clinic.   BELOW ARE SYMPTOMS THAT SHOULD BE REPORTED IMMEDIATELY:  *FEVER GREATER THAN 100.5 F  *CHILLS WITH OR WITHOUT FEVER  NAUSEA AND VOMITING THAT IS NOT CONTROLLED WITH YOUR NAUSEA MEDICATION  *UNUSUAL SHORTNESS OF BREATH  *UNUSUAL BRUISING OR BLEEDING  TENDERNESS IN MOUTH AND THROAT WITH OR WITHOUT PRESENCE OF ULCERS  *URINARY PROBLEMS  *BOWEL PROBLEMS  UNUSUAL RASH Items with * indicate a potential emergency and should be followed up as soon as possible.  Feel free to call the clinic you have any questions or concerns. The clinic phone number is [phone].

## 2014-08-07 ENCOUNTER — Other Ambulatory Visit (HOSPITAL_BASED_OUTPATIENT_CLINIC_OR_DEPARTMENT_OTHER): Payer: BC Managed Care – PPO

## 2014-08-07 ENCOUNTER — Ambulatory Visit (HOSPITAL_BASED_OUTPATIENT_CLINIC_OR_DEPARTMENT_OTHER): Payer: BC Managed Care – PPO

## 2014-08-07 DIAGNOSIS — Z5112 Encounter for antineoplastic immunotherapy: Secondary | ICD-10-CM

## 2014-08-07 DIAGNOSIS — M898X9 Other specified disorders of bone, unspecified site: Secondary | ICD-10-CM

## 2014-08-07 DIAGNOSIS — C9000 Multiple myeloma not having achieved remission: Secondary | ICD-10-CM

## 2014-08-07 DIAGNOSIS — Z23 Encounter for immunization: Secondary | ICD-10-CM

## 2014-08-07 LAB — CBC WITH DIFFERENTIAL/PLATELET
BASO%: 0.2 % (ref 0.0–2.0)
Basophils Absolute: 0 10*3/uL (ref 0.0–0.1)
EOS%: 0 % (ref 0.0–7.0)
Eosinophils Absolute: 0 10*3/uL (ref 0.0–0.5)
HCT: 27.8 % — ABNORMAL LOW (ref 34.8–46.6)
HGB: 9.1 g/dL — ABNORMAL LOW (ref 11.6–15.9)
LYMPH#: 0.2 10*3/uL — AB (ref 0.9–3.3)
LYMPH%: 3.2 % — ABNORMAL LOW (ref 14.0–49.7)
MCH: 31.8 pg (ref 25.1–34.0)
MCHC: 32.8 g/dL (ref 31.5–36.0)
MCV: 96.9 fL (ref 79.5–101.0)
MONO#: 0.1 10*3/uL (ref 0.1–0.9)
MONO%: 1.3 % (ref 0.0–14.0)
NEUT#: 7.3 10*3/uL — ABNORMAL HIGH (ref 1.5–6.5)
NEUT%: 95.3 % — ABNORMAL HIGH (ref 38.4–76.8)
Platelets: 215 10*3/uL (ref 145–400)
RBC: 2.87 10*6/uL — ABNORMAL LOW (ref 3.70–5.45)
RDW: 14.8 % — AB (ref 11.2–14.5)
WBC: 7.7 10*3/uL (ref 3.9–10.3)

## 2014-08-07 LAB — COMPREHENSIVE METABOLIC PANEL (CC13)
ALK PHOS: 118 U/L (ref 40–150)
ALT: 30 U/L (ref 0–55)
AST: 15 U/L (ref 5–34)
Albumin: 3.6 g/dL (ref 3.5–5.0)
Anion Gap: 10 mEq/L (ref 3–11)
BUN: 24.6 mg/dL (ref 7.0–26.0)
CALCIUM: 9.1 mg/dL (ref 8.4–10.4)
CHLORIDE: 103 meq/L (ref 98–109)
CO2: 23 mEq/L (ref 22–29)
Creatinine: 1.3 mg/dL — ABNORMAL HIGH (ref 0.6–1.1)
Glucose: 102 mg/dl (ref 70–140)
POTASSIUM: 4 meq/L (ref 3.5–5.1)
Sodium: 137 mEq/L (ref 136–145)
Total Bilirubin: 0.21 mg/dL (ref 0.20–1.20)
Total Protein: 7.2 g/dL (ref 6.4–8.3)

## 2014-08-07 MED ORDER — BORTEZOMIB CHEMO SQ INJECTION 3.5 MG (2.5MG/ML)
1.3000 mg/m2 | Freq: Once | INTRAMUSCULAR | Status: AC
  Administered 2014-08-07: 1.75 mg via SUBCUTANEOUS
  Filled 2014-08-07: qty 1.75

## 2014-08-07 MED ORDER — ONDANSETRON HCL 8 MG PO TABS
8.0000 mg | ORAL_TABLET | Freq: Once | ORAL | Status: AC
  Administered 2014-08-07: 8 mg via ORAL

## 2014-08-07 MED ORDER — INFLUENZA VAC SPLIT QUAD 0.5 ML IM SUSY
0.5000 mL | PREFILLED_SYRINGE | Freq: Once | INTRAMUSCULAR | Status: AC
  Administered 2014-08-07: 0.5 mL via INTRAMUSCULAR
  Filled 2014-08-07: qty 0.5

## 2014-08-07 MED ORDER — ONDANSETRON HCL 8 MG PO TABS
ORAL_TABLET | ORAL | Status: AC
  Filled 2014-08-07: qty 1

## 2014-08-07 NOTE — Patient Instructions (Signed)
[facility] Discharge Instructions for Patients Receiving Chemotherapy  Today you received the following chemotherapy agents velcade.  To help prevent nausea and vomiting after your treatment, we encourage you to take your nausea medication as prescribed.   If you develop nausea and vomiting that is not controlled by your nausea medication, call the clinic.   BELOW ARE SYMPTOMS THAT SHOULD BE REPORTED IMMEDIATELY:  *FEVER GREATER THAN 100.5 F  *CHILLS WITH OR WITHOUT FEVER  NAUSEA AND VOMITING THAT IS NOT CONTROLLED WITH YOUR NAUSEA MEDICATION  *UNUSUAL SHORTNESS OF BREATH  *UNUSUAL BRUISING OR BLEEDING  TENDERNESS IN MOUTH AND THROAT WITH OR WITHOUT PRESENCE OF ULCERS  *URINARY PROBLEMS  *BOWEL PROBLEMS  UNUSUAL RASH Items with * indicate a potential emergency and should be followed up as soon as possible.  Feel free to call the clinic you have any questions or concerns. The clinic phone number is [phone].

## 2014-08-11 ENCOUNTER — Ambulatory Visit (HOSPITAL_BASED_OUTPATIENT_CLINIC_OR_DEPARTMENT_OTHER): Payer: BC Managed Care – PPO

## 2014-08-11 DIAGNOSIS — C9000 Multiple myeloma not having achieved remission: Secondary | ICD-10-CM

## 2014-08-11 DIAGNOSIS — Z5112 Encounter for antineoplastic immunotherapy: Secondary | ICD-10-CM

## 2014-08-11 MED ORDER — ONDANSETRON HCL 8 MG PO TABS
8.0000 mg | ORAL_TABLET | Freq: Once | ORAL | Status: AC
  Administered 2014-08-11: 8 mg via ORAL

## 2014-08-11 MED ORDER — BORTEZOMIB CHEMO SQ INJECTION 3.5 MG (2.5MG/ML)
1.3000 mg/m2 | Freq: Once | INTRAMUSCULAR | Status: AC
  Administered 2014-08-11: 1.75 mg via SUBCUTANEOUS
  Filled 2014-08-11: qty 1.75

## 2014-08-11 MED ORDER — ONDANSETRON HCL 8 MG PO TABS
ORAL_TABLET | ORAL | Status: AC
  Filled 2014-08-11: qty 1

## 2014-08-11 NOTE — Progress Notes (Signed)
Okay to treat per Dr. Alvy Bimler with elevated HR of 106

## 2014-08-11 NOTE — Patient Instructions (Signed)
[facility] Discharge Instructions for Patients Receiving Chemotherapy  Today you received the following chemotherapy agents: Velcade.  To help prevent nausea and vomiting after your treatment, we encourage you to take your nausea medication as prescribed.   If you develop nausea and vomiting that is not controlled by your nausea medication, call the clinic.   BELOW ARE SYMPTOMS THAT SHOULD BE REPORTED IMMEDIATELY:  *FEVER GREATER THAN 100.5 F  *CHILLS WITH OR WITHOUT FEVER  NAUSEA AND VOMITING THAT IS NOT CONTROLLED WITH YOUR NAUSEA MEDICATION  *UNUSUAL SHORTNESS OF BREATH  *UNUSUAL BRUISING OR BLEEDING  TENDERNESS IN MOUTH AND THROAT WITH OR WITHOUT PRESENCE OF ULCERS  *URINARY PROBLEMS  *BOWEL PROBLEMS  UNUSUAL RASH Items with * indicate a potential emergency and should be followed up as soon as possible.  Feel free to call the clinic you have any questions or concerns. The clinic phone number is [phone].    

## 2014-08-12 LAB — SPEP & IFE WITH QIG
ALBUMIN ELP: 53.5 % — AB (ref 55.8–66.1)
ALPHA-1-GLOBULIN: 5.8 % — AB (ref 2.9–4.9)
Alpha-2-Globulin: 14.5 % — ABNORMAL HIGH (ref 7.1–11.8)
BETA 2: 16.2 % — AB (ref 3.2–6.5)
Beta Globulin: 5.6 % (ref 4.7–7.2)
Gamma Globulin: 4.4 % — ABNORMAL LOW (ref 11.1–18.8)
IGM, SERUM: 35 mg/dL — AB (ref 52–322)
IgA: 762 mg/dL — ABNORMAL HIGH (ref 69–380)
IgG (Immunoglobin G), Serum: 389 mg/dL — ABNORMAL LOW (ref 690–1700)
M-Spike, %: 0.73 g/dL
TOTAL PROTEIN, SERUM ELECTROPHOR: 6.7 g/dL (ref 6.0–8.3)

## 2014-08-12 LAB — KAPPA/LAMBDA LIGHT CHAINS
KAPPA FREE LGHT CHN: 230 mg/dL — AB (ref 0.33–1.94)
KAPPA LAMBDA RATIO: 370.97 — AB (ref 0.26–1.65)
LAMBDA FREE LGHT CHN: 0.62 mg/dL (ref 0.57–2.63)

## 2014-08-18 ENCOUNTER — Encounter: Payer: Self-pay | Admitting: Hematology and Oncology

## 2014-08-18 NOTE — Progress Notes (Signed)
Faxed revlimid pa form to [facility] @ [phone]

## 2014-08-19 ENCOUNTER — Telehealth: Payer: Self-pay | Admitting: *Deleted

## 2014-08-19 ENCOUNTER — Other Ambulatory Visit (HOSPITAL_COMMUNITY): Payer: Self-pay | Admitting: Interventional Radiology

## 2014-08-19 ENCOUNTER — Encounter: Payer: Self-pay | Admitting: Hematology and Oncology

## 2014-08-19 ENCOUNTER — Telehealth (HOSPITAL_COMMUNITY): Payer: Self-pay | Admitting: Interventional Radiology

## 2014-08-19 DIAGNOSIS — M899 Disorder of bone, unspecified: Secondary | ICD-10-CM

## 2014-08-19 DIAGNOSIS — M549 Dorsalgia, unspecified: Secondary | ICD-10-CM

## 2014-08-19 DIAGNOSIS — C9000 Multiple myeloma not having achieved remission: Secondary | ICD-10-CM

## 2014-08-19 DIAGNOSIS — T148XXA Other injury of unspecified body region, initial encounter: Secondary | ICD-10-CM

## 2014-08-19 DIAGNOSIS — M949 Disorder of cartilage, unspecified: Secondary | ICD-10-CM

## 2014-08-19 NOTE — Telephone Encounter (Signed)
Charlena Cross informs that Revlimid has been approved by [employer] and she sent approval back to [facility].  Called pt to find out if she has heard anything from pharmacy yet and she has not.  Called [facility] and they state they did not receive Rx but can see in system that [facility] has tried to fill it.

## 2014-08-19 NOTE — Telephone Encounter (Signed)
I have made multiple attempts to reach patient and she has made just as many to get me on the phone. We continue to play "phone tag." I left her yet another VM this morning to call and set up her consult with Deveshwar. There is one request that the patient has for her appointment that is making it a bit more difficult to scheduled. She wants her appointment to be when her friend "Lavella Hammock" can attend with her and Mrs. Rhinehuls apparently has a very limited schedule. I will do my best to make this as convenient as possible for the patient. JM

## 2014-08-19 NOTE — Telephone Encounter (Signed)
Pt asked about status of Revlimid.  States she has not heard anything about it yet and it was prescribed a few weeks ago.   Called [name] in our managed care dept to f/u on status of medication.

## 2014-08-19 NOTE — Progress Notes (Signed)
[employer] approved revlimid from 08/19/14-12/03/38

## 2014-08-19 NOTE — Telephone Encounter (Signed)
[place] and they are processing Rx.Marland Kitchen  Spoke w/ [employer] and they say that pt's co-pay should be $0.  They will contact pt for delivery.  Informed pt of [facility] will be calling her to discuss delivery and that her co pay is $0.  She verbalized understanding.

## 2014-08-21 ENCOUNTER — Ambulatory Visit (HOSPITAL_BASED_OUTPATIENT_CLINIC_OR_DEPARTMENT_OTHER): Payer: BC Managed Care – PPO

## 2014-08-21 ENCOUNTER — Other Ambulatory Visit (HOSPITAL_BASED_OUTPATIENT_CLINIC_OR_DEPARTMENT_OTHER): Payer: BC Managed Care – PPO

## 2014-08-21 ENCOUNTER — Telehealth: Payer: Self-pay | Admitting: Hematology and Oncology

## 2014-08-21 ENCOUNTER — Telehealth: Payer: Self-pay | Admitting: *Deleted

## 2014-08-21 ENCOUNTER — Ambulatory Visit (HOSPITAL_BASED_OUTPATIENT_CLINIC_OR_DEPARTMENT_OTHER): Payer: BC Managed Care – PPO | Admitting: Hematology and Oncology

## 2014-08-21 VITALS — BP 139/76 | HR 111 | Temp 99.0°F | Resp 17 | Ht 60.25 in | Wt 103.8 lb

## 2014-08-21 DIAGNOSIS — IMO0002 Reserved for concepts with insufficient information to code with codable children: Secondary | ICD-10-CM

## 2014-08-21 DIAGNOSIS — M899 Disorder of bone, unspecified: Secondary | ICD-10-CM

## 2014-08-21 DIAGNOSIS — C9000 Multiple myeloma not having achieved remission: Secondary | ICD-10-CM

## 2014-08-21 DIAGNOSIS — M898X9 Other specified disorders of bone, unspecified site: Secondary | ICD-10-CM

## 2014-08-21 DIAGNOSIS — Z5112 Encounter for antineoplastic immunotherapy: Secondary | ICD-10-CM

## 2014-08-21 DIAGNOSIS — D630 Anemia in neoplastic disease: Secondary | ICD-10-CM

## 2014-08-21 DIAGNOSIS — N179 Acute kidney failure, unspecified: Secondary | ICD-10-CM

## 2014-08-21 DIAGNOSIS — M949 Disorder of cartilage, unspecified: Secondary | ICD-10-CM

## 2014-08-21 LAB — CBC WITH DIFFERENTIAL/PLATELET
BASO%: 0.1 % (ref 0.0–2.0)
Basophils Absolute: 0 10*3/uL (ref 0.0–0.1)
EOS%: 0 % (ref 0.0–7.0)
Eosinophils Absolute: 0 10*3/uL (ref 0.0–0.5)
HCT: 29.2 % — ABNORMAL LOW (ref 34.8–46.6)
HGB: 9.6 g/dL — ABNORMAL LOW (ref 11.6–15.9)
LYMPH#: 0.2 10*3/uL — AB (ref 0.9–3.3)
LYMPH%: 3.2 % — ABNORMAL LOW (ref 14.0–49.7)
MCH: 32.8 pg (ref 25.1–34.0)
MCHC: 32.9 g/dL (ref 31.5–36.0)
MCV: 99.7 fL (ref 79.5–101.0)
MONO#: 0 10*3/uL — AB (ref 0.1–0.9)
MONO%: 0.4 % (ref 0.0–14.0)
NEUT#: 6.8 10*3/uL — ABNORMAL HIGH (ref 1.5–6.5)
NEUT%: 96.3 % — ABNORMAL HIGH (ref 38.4–76.8)
Platelets: 260 10*3/uL (ref 145–400)
RBC: 2.93 10*6/uL — ABNORMAL LOW (ref 3.70–5.45)
RDW: 14.8 % — AB (ref 11.2–14.5)
WBC: 7.1 10*3/uL (ref 3.9–10.3)

## 2014-08-21 LAB — COMPREHENSIVE METABOLIC PANEL (CC13)
ALT: 25 U/L (ref 0–55)
ANION GAP: 10 meq/L (ref 3–11)
AST: 23 U/L (ref 5–34)
Albumin: 3.8 g/dL (ref 3.5–5.0)
Alkaline Phosphatase: 173 U/L — ABNORMAL HIGH (ref 40–150)
BUN: 16.7 mg/dL (ref 7.0–26.0)
CALCIUM: 9.7 mg/dL (ref 8.4–10.4)
CHLORIDE: 104 meq/L (ref 98–109)
CO2: 25 meq/L (ref 22–29)
Creatinine: 1.2 mg/dL — ABNORMAL HIGH (ref 0.6–1.1)
Glucose: 135 mg/dl (ref 70–140)
Potassium: 4.3 mEq/L (ref 3.5–5.1)
SODIUM: 139 meq/L (ref 136–145)
Total Bilirubin: 0.24 mg/dL (ref 0.20–1.20)
Total Protein: 7.2 g/dL (ref 6.4–8.3)

## 2014-08-21 MED ORDER — SODIUM CHLORIDE 0.9 % IV SOLN
3.0000 mg | Freq: Once | INTRAVENOUS | Status: AC
  Administered 2014-08-21: 3 mg via INTRAVENOUS
  Filled 2014-08-21: qty 3.75

## 2014-08-21 MED ORDER — BORTEZOMIB CHEMO SQ INJECTION 3.5 MG (2.5MG/ML)
1.3000 mg/m2 | Freq: Once | INTRAMUSCULAR | Status: AC
  Administered 2014-08-21: 1.75 mg via SUBCUTANEOUS
  Filled 2014-08-21: qty 1.75

## 2014-08-21 MED ORDER — MORPHINE SULFATE ER 30 MG PO TBCR: 30.0000 mg | EXTENDED_RELEASE_TABLET | Freq: Two times a day (BID) | ORAL | Status: DC

## 2014-08-21 MED ORDER — ONDANSETRON HCL 8 MG PO TABS
8.0000 mg | ORAL_TABLET | Freq: Once | ORAL | Status: AC
  Administered 2014-08-21: 8 mg via ORAL

## 2014-08-21 MED ORDER — ONDANSETRON HCL 8 MG PO TABS
ORAL_TABLET | ORAL | Status: AC
  Filled 2014-08-21: qty 1

## 2014-08-21 MED ORDER — SODIUM CHLORIDE 0.9 % IV SOLN
Freq: Once | INTRAVENOUS | Status: AC
  Administered 2014-08-21: 14:00:00 via INTRAVENOUS

## 2014-08-21 MED ORDER — HYDROMORPHONE HCL 4 MG PO TABS: 4.0000 mg | ORAL_TABLET | ORAL | Status: DC | PRN

## 2014-08-21 NOTE — Progress Notes (Signed)
[facility] OFFICE PROGRESS NOTE  Patient Care Team: Hulan Fess, MD as PCP - General (Family Medicine) Ashok Pall, MD as Consulting Physician (Neurosurgery)  SUMMARY OF ONCOLOGIC HISTORY: Oncology History   Multiple myeloma without remission; Calcium 13.3, Creatinine 2.83, Hg 8.3, Bone lesions present, Beta 2 microglobulin 12.8, Albumin 3.5, IgA 1760 and kappa light chains 1670. Bone marrow biopsy showed 82% involvement.   Primary site: Multiple Myeloma   Staging method: AJCC 6th Edition   Clinical: Stage IIIB signed by Heath Lark, MD on 07/13/2014 12:38 PM   Summary: Stage IIIB       Multiple myeloma without remission   07/07/2014 - 07/10/2014 Hospital Admission She was admitted to the hospital for management of malignant hypercalcemia and had bone marrow biopsy which confirmed diagnosis of multiple myeloma. The patient received one unit of blood transfusion due to severe anemia.   07/08/2014 Bone Marrow Biopsy The patient had bone marrow biopsy that confirmed diagnosis.   07/08/2014 Pathology Results Accession: [id number] showed 82% involvement of bone marrow.   07/08/2014 -  Chemotherapy She was started on Velcade along with dexamethasone.    INTERVAL HISTORY: Please see below for problem oriented charting. She is seen prior to cycle 3 of therapy. She tolerated treatment well. She has improved energy level. Denies recent nausea or constipation. She said her persistent bone pain, rating it 7/10 pain.  REVIEW OF SYSTEMS:   Constitutional: Denies fevers, chills or abnormal weight loss Eyes: Denies blurriness of vision Ears, nose, mouth, throat, and face: Denies mucositis or sore throat Respiratory: Denies cough, dyspnea or wheezes Cardiovascular: Denies palpitation, chest discomfort or lower extremity swelling Gastrointestinal:  Denies nausea, heartburn or change in bowel habits Skin: Denies abnormal skin rashes Lymphatics: Denies new lymphadenopathy or easy  bruising Neurological:Denies numbness, tingling or new weaknesses Behavioral/Psych: Mood is stable, no new changes  All other systems were reviewed with the patient and are negative.  I have reviewed the past medical history, past surgical history, social history and family history with the patient and they are unchanged from previous note.  ALLERGIES:  is allergic to ace inhibitors; hydrocodone; and cephalexin.  MEDICATIONS:  Current Outpatient Prescriptions  Medication Sig Dispense Refill  . acyclovir (ZOVIRAX) 400 MG tablet Take 1 tablet (400 mg total) by mouth daily.  30 tablet  6  . amLODipine (NORVASC) 10 MG tablet Take 1 tablet (10 mg total) by mouth daily.  30 tablet  1  . aspirin 81 MG tablet Take 81 mg by mouth daily.      Marland Kitchen atorvastatin (LIPITOR) 10 MG tablet Take 10 mg by mouth every morning.       . cholecalciferol (VITAMIN D) 1000 UNITS tablet Take 2,000 Units by mouth every morning.       Marland Kitchen dexamethasone (DECADRON) 4 MG tablet Twice a week on days of chemo  90 tablet  1  . HYDROmorphone (DILAUDID) 4 MG tablet Take 1 tablet (4 mg total) by mouth every 4 (four) hours as needed for severe pain.  90 tablet  0  . irbesartan-hydrochlorothiazide (AVALIDE) 300-12.5 MG per tablet       . lenalidomide (REVLIMID) 10 MG capsule Take 1 capsule (10 mg total) by mouth daily.  14 capsule  0  . omeprazole (PRILOSEC) 20 MG capsule Take 20 mg by mouth every morning.       . polyvinyl alcohol (LIQUIFILM TEARS) 1.4 % ophthalmic solution Place 1-2 drops into both eyes 2 (two) times daily as needed for  dry eyes.       . promethazine (PHENERGAN) 25 MG tablet Take 1 tablet (25 mg total) by mouth every 6 (six) hours as needed for nausea or vomiting.  60 tablet  3  . senna-docusate (SENOKOT-S) 8.6-50 MG per tablet Take 2 tablets by mouth 2 (two) times daily.  60 tablet  3  . ciprofloxacin (CIPRO) 500 MG tablet Take 1 tablet (500 mg total) by mouth 2 (two) times daily.  14 tablet  0  . morphine (MS  CONTIN) 30 MG 12 hr tablet Take 1 tablet (30 mg total) by mouth every 12 (twelve) hours.  60 tablet  0   No current facility-administered medications for this visit.    PHYSICAL EXAMINATION: ECOG PERFORMANCE STATUS: 1 - Symptomatic but completely ambulatory  Filed Vitals:   08/21/14 1154  BP: 139/76  Pulse: 111  Temp: 99 F (37.2 C)  Resp: 17   Filed Weights   08/21/14 1154  Weight: 103 lb 12.8 oz (47.083 kg)    GENERAL:alert, no distress and comfortable. She appears thin and cachectic SKIN: skin color, texture, turgor are normal, no rashes or significant lesions EYES: normal, Conjunctiva are pink and non-injected, sclera clear OROPHARYNX:no exudate, no erythema and lips, buccal mucosa, and tongue normal  NECK: supple, thyroid normal size, non-tender, without nodularity LYMPH:  no palpable lymphadenopathy in the cervical, axillary or inguinal LUNGS: clear to auscultation and percussion with normal breathing effort HEART: regular rate & rhythm and no murmurs and no lower extremity edema ABDOMEN:abdomen soft, non-tender and normal bowel sounds Musculoskeletal:no cyanosis of digits and no clubbing  NEURO: alert & oriented x 3 with fluent speech, no focal motor/sensory deficits  LABORATORY DATA:  I have reviewed the data as listed    Component Value Date/Time   NA 139 08/21/2014 1135   NA 136 07/13/2014 1222   K 4.3 08/21/2014 1135   K 3.3* 07/13/2014 1222   CL 100 07/13/2014 1222   CO2 25 08/21/2014 1135   CO2 24 07/13/2014 1222   GLUCOSE 135 08/21/2014 1135   GLUCOSE 91 07/13/2014 1222   BUN 16.7 08/21/2014 1135   BUN 23 07/13/2014 1222   CREATININE 1.2* 08/21/2014 1135   CREATININE 2.05* 07/13/2014 1222   CALCIUM 9.7 08/21/2014 1135   CALCIUM 10.4 07/13/2014 1222   PROT 7.2 08/21/2014 1135   PROT 7.2 07/13/2014 1222   ALBUMIN 3.8 08/21/2014 1135   ALBUMIN 3.9 07/13/2014 1222   AST 23 08/21/2014 1135   AST 22 07/13/2014 1222   ALT 25 08/21/2014 1135   ALT 75* 07/13/2014 1222    ALKPHOS 173* 08/21/2014 1135   ALKPHOS 90 07/13/2014 1222   BILITOT 0.24 08/21/2014 1135   BILITOT 0.4 07/13/2014 1222   GFRNONAA 20* 07/10/2014 0445   GFRAA 23* 07/10/2014 0445    No results found for this basename: SPEP, UPEP,  kappa and lambda light chains    Lab Results  Component Value Date   WBC 7.1 08/21/2014   NEUTROABS 6.8* 08/21/2014   HGB 9.6* 08/21/2014   HCT 29.2* 08/21/2014   MCV 99.7 08/21/2014   PLT 260 08/21/2014      Chemistry      Component Value Date/Time   NA 139 08/21/2014 1135   NA 136 07/13/2014 1222   K 4.3 08/21/2014 1135   K 3.3* 07/13/2014 1222   CL 100 07/13/2014 1222   CO2 25 08/21/2014 1135   CO2 24 07/13/2014 1222   BUN 16.7 08/21/2014 1135  BUN 23 07/13/2014 1222   CREATININE 1.2* 08/21/2014 1135   CREATININE 2.05* 07/13/2014 1222      Component Value Date/Time   CALCIUM 9.7 08/21/2014 1135   CALCIUM 10.4 07/13/2014 1222   ALKPHOS 173* 08/21/2014 1135   ALKPHOS 90 07/13/2014 1222   AST 23 08/21/2014 1135   AST 22 07/13/2014 1222   ALT 25 08/21/2014 1135   ALT 75* 07/13/2014 1222   BILITOT 0.24 08/21/2014 1135   BILITOT 0.4 07/13/2014 1222      ASSESSMENT & PLAN:  Multiple myeloma without remission Overall, she tolerated her treatment well. The serum creatinine continues to improve. Today, we will add Revlimid and Zometa. The patient is interested to pursue bone marrow transplant. After 4 cycles of induction chemotherapy, we'll proceed to restage her with repeat bone marrow aspirate and biopsy.  Bone pain I refilled her prescription pain medicine. I warned her about side effects and we discussed narcotic refill policy. Due to persistent pain, I recommend sustained release morphine and she is willing to try.  Compression fracture She has been referred for kyphoplasty.  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 The patient denies recent history of bleeding such as epistaxis, hematuria or hematochezia. She is asymptomatic from the  anemia. I will observe for now.  She does not require transfusion now. I will continue the chemotherapy at current dose without dosage adjustment.  If the anemia gets progressive worse in the future, I might have to delay her treatment or adjust the chemotherapy dose.  Acute renal failure It is almost back to normal. Recommend she continue increase hydration.   No orders of the defined types were placed in this encounter.   All questions were answered. The patient knows to call the clinic with any problems, questions or concerns. No barriers to learning was detected. I spent 30 minutes counseling the patient face to face. The total time spent in the appointment was 40 minutes and more than 50% was on counseling and review of test results     [facility], [place], MD 08/21/2014 4:01 PM

## 2014-08-21 NOTE — Telephone Encounter (Signed)
Pt confirmed labs/ov per 09/18 POF, sent msg to add chemo, gave pt AVS....KJ

## 2014-08-21 NOTE — Assessment & Plan Note (Signed)
It is almost back to normal. Recommend she continue increase hydration.

## 2014-08-21 NOTE — Assessment & Plan Note (Signed)

## 2014-08-21 NOTE — Assessment & Plan Note (Signed)
Overall, she tolerated her treatment well. The serum creatinine continues to improve. Today, we will add Revlimid and Zometa. The patient is interested to pursue bone marrow transplant. After 4 cycles of induction chemotherapy, we'll proceed to restage her with repeat bone marrow aspirate and biopsy.

## 2014-08-21 NOTE — Assessment & Plan Note (Signed)
She has been referred for kyphoplasty.

## 2014-08-21 NOTE — Telephone Encounter (Signed)
Per staff message and POF I have scheduled appts. Advised scheduler of appts. JMW  

## 2014-08-21 NOTE — Patient Instructions (Signed)
[facility] Discharge Instructions for Patients Receiving Chemotherapy  Today you received the following chemotherapy agents Velcade To help prevent nausea and vomiting after your treatment, we encourage you to take your nausea medication as prescribed.   If you develop nausea and vomiting that is not controlled by your nausea medication, call the clinic.   BELOW ARE SYMPTOMS THAT SHOULD BE REPORTED IMMEDIATELY:  *FEVER GREATER THAN 100.5 F  *CHILLS WITH OR WITHOUT FEVER  NAUSEA AND VOMITING THAT IS NOT CONTROLLED WITH YOUR NAUSEA MEDICATION  *UNUSUAL SHORTNESS OF BREATH  *UNUSUAL BRUISING OR BLEEDING  TENDERNESS IN MOUTH AND THROAT WITH OR WITHOUT PRESENCE OF ULCERS  *URINARY PROBLEMS  *BOWEL PROBLEMS  UNUSUAL RASH Items with * indicate a potential emergency and should be followed up as soon as possible.  Feel free to call the clinic you have any questions or concerns. The clinic phone number is [phone].   Zoledronic Acid injection (Hypercalcemia, Oncology) What is this medicine? ZOLEDRONIC ACID (ZOE le dron ik AS id) lowers the amount of calcium loss from bone. It is used to treat too much calcium in your blood from cancer. It is also used to prevent complications of cancer that has spread to the bone. This medicine may be used for other purposes; ask your health care provider or pharmacist if you have questions. COMMON BRAND NAME(S): Zometa What should I tell my health care provider before I take this medicine? They need to know if you have any of these conditions: -aspirin-sensitive asthma -cancer, especially if you are receiving medicines used to treat cancer -dental disease or wear dentures -infection -kidney disease -receiving corticosteroids like dexamethasone or prednisone -an unusual or allergic reaction to zoledronic acid, other medicines, foods, dyes, or preservatives -pregnant or trying to get pregnant -breast-feeding How should I use  this medicine? This medicine is for infusion into a vein. It is given by a health care professional in a hospital or clinic setting. Talk to your pediatrician regarding the use of this medicine in children. Special care may be needed. Overdosage: If you think you have taken too much of this medicine contact a poison control center or emergency room at once. NOTE: This medicine is only for you. Do not share this medicine with others. What if I miss a dose? It is important not to miss your dose. Call your doctor or health care professional if you are unable to keep an appointment. What may interact with this medicine? -certain antibiotics given by injection -NSAIDs, medicines for pain and inflammation, like ibuprofen or naproxen -some diuretics like bumetanide, furosemide -teriparatide -thalidomide This list may not describe all possible interactions. Give your health care provider a list of all the medicines, herbs, non-prescription drugs, or dietary supplements you use. Also tell them if you smoke, drink alcohol, or use illegal drugs. Some items may interact with your medicine. What should I watch for while using this medicine? Visit your doctor or health care professional for regular checkups. It may be some time before you see the benefit from this medicine. Do not stop taking your medicine unless your doctor tells you to. Your doctor may order blood tests or other tests to see how you are doing. Women should inform their doctor if they wish to become pregnant or think they might be pregnant. There is a potential for serious side effects to an unborn child. Talk to your health care professional or pharmacist for more information. You should make sure that you get enough   calcium and vitamin D while you are taking this medicine. Discuss the foods you eat and the vitamins you take with your health care professional. Some people who take this medicine have severe bone, joint, and/or muscle pain. This  medicine may also increase your risk for jaw problems or a broken thigh bone. Tell your doctor right away if you have severe pain in your jaw, bones, joints, or muscles. Tell your doctor if you have any pain that does not go away or that gets worse. Tell your dentist and dental surgeon that you are taking this medicine. You should not have major dental surgery while on this medicine. See your dentist to have a dental exam and fix any dental problems before starting this medicine. Take good care of your teeth while on this medicine. Make sure you see your dentist for regular follow-up appointments. What side effects may I notice from receiving this medicine? Side effects that you should report to your doctor or health care professional as soon as possible: -allergic reactions like skin rash, itching or hives, swelling of the face, lips, or tongue -anxiety, confusion, or depression -breathing problems -changes in vision -eye pain -feeling faint or lightheaded, falls -jaw pain, especially after dental work -mouth sores -muscle cramps, stiffness, or weakness -trouble passing urine or change in the amount of urine Side effects that usually do not require medical attention (report to your doctor or health care professional if they continue or are bothersome): -bone, joint, or muscle pain -constipation -diarrhea -fever -hair loss -irritation at site where injected -loss of appetite -nausea, vomiting -stomach upset -trouble sleeping -trouble swallowing -weak or tired This list may not describe all possible side effects. Call your doctor for medical advice about side effects. You may report side effects to FDA at 1-800-FDA-1088. Where should I keep my medicine? This drug is given in a hospital or clinic and will not be stored at home. NOTE: This sheet is a summary. It may not cover all possible information. If you have questions about this medicine, talk to your doctor, pharmacist, or health  care provider.  2015, [employer]. (2013-05-01 13:03:13)    

## 2014-08-21 NOTE — Assessment & Plan Note (Signed)
I refilled her prescription pain medicine. I warned her about side effects and we discussed narcotic refill policy. Due to persistent pain, I recommend sustained release morphine and she is willing to try.

## 2014-08-21 NOTE — Telephone Encounter (Signed)
[facility] to f/u on Revlimid Rx.  Pt states she has not heard anything from them yet.  William at [facility] they are ready to deliver medication they just need to arrange delivery w/ pt..  Pt can call them at [phone] to arrange delivery.  Phone number given to pt and instructed her to call today.  She verbalized understanding.

## 2014-08-24 ENCOUNTER — Ambulatory Visit (HOSPITAL_BASED_OUTPATIENT_CLINIC_OR_DEPARTMENT_OTHER): Payer: BC Managed Care – PPO

## 2014-08-24 ENCOUNTER — Encounter: Payer: Self-pay | Admitting: *Deleted

## 2014-08-24 DIAGNOSIS — Z5112 Encounter for antineoplastic immunotherapy: Secondary | ICD-10-CM

## 2014-08-24 DIAGNOSIS — C9000 Multiple myeloma not having achieved remission: Secondary | ICD-10-CM

## 2014-08-24 MED ORDER — ONDANSETRON HCL 8 MG PO TABS
ORAL_TABLET | ORAL | Status: AC
  Filled 2014-08-24: qty 1

## 2014-08-24 MED ORDER — ONDANSETRON HCL 8 MG PO TABS
8.0000 mg | ORAL_TABLET | Freq: Once | ORAL | Status: AC
  Administered 2014-08-24: 8 mg via ORAL

## 2014-08-24 MED ORDER — BORTEZOMIB CHEMO SQ INJECTION 3.5 MG (2.5MG/ML)
1.3000 mg/m2 | Freq: Once | INTRAMUSCULAR | Status: AC
  Administered 2014-08-24: 1.75 mg via SUBCUTANEOUS
  Filled 2014-08-24: qty 1.75

## 2014-08-24 NOTE — Patient Instructions (Signed)
[facility] Discharge Instructions for Patients Receiving Chemotherapy  Today you received the following chemotherapy agents: Velcade.  To help prevent nausea and vomiting after your treatment, we encourage you to take your nausea medication: Phenergan 25 mg every 6 hours as needed.   If you develop nausea and vomiting that is not controlled by your nausea medication, call the clinic.   BELOW ARE SYMPTOMS THAT SHOULD BE REPORTED IMMEDIATELY:  *FEVER GREATER THAN 100.5 F  *CHILLS WITH OR WITHOUT FEVER  NAUSEA AND VOMITING THAT IS NOT CONTROLLED WITH YOUR NAUSEA MEDICATION  *UNUSUAL SHORTNESS OF BREATH  *UNUSUAL BRUISING OR BLEEDING  TENDERNESS IN MOUTH AND THROAT WITH OR WITHOUT PRESENCE OF ULCERS  *URINARY PROBLEMS  *BOWEL PROBLEMS  UNUSUAL RASH Items with * indicate a potential emergency and should be followed up as soon as possible.  Feel free to call the clinic you have any questions or concerns. The clinic phone number is [phone].

## 2014-08-24 NOTE — Progress Notes (Signed)
RECEIVED A FAX FROM [facility] CONCERNING A CONFIRMATION OF PRESCRIPTION SHIPMENT FOR REVLIMID ON 08/22/14.

## 2014-08-28 ENCOUNTER — Ambulatory Visit (HOSPITAL_BASED_OUTPATIENT_CLINIC_OR_DEPARTMENT_OTHER): Payer: BC Managed Care – PPO | Admitting: Nurse Practitioner

## 2014-08-28 ENCOUNTER — Telehealth: Payer: Self-pay

## 2014-08-28 ENCOUNTER — Other Ambulatory Visit: Payer: Self-pay | Admitting: *Deleted

## 2014-08-28 ENCOUNTER — Ambulatory Visit (HOSPITAL_BASED_OUTPATIENT_CLINIC_OR_DEPARTMENT_OTHER): Payer: BC Managed Care – PPO

## 2014-08-28 DIAGNOSIS — C9000 Multiple myeloma not having achieved remission: Secondary | ICD-10-CM

## 2014-08-28 DIAGNOSIS — R350 Frequency of micturition: Secondary | ICD-10-CM

## 2014-08-28 DIAGNOSIS — M898X9 Other specified disorders of bone, unspecified site: Secondary | ICD-10-CM

## 2014-08-28 DIAGNOSIS — Z5112 Encounter for antineoplastic immunotherapy: Secondary | ICD-10-CM

## 2014-08-28 DIAGNOSIS — R300 Dysuria: Secondary | ICD-10-CM

## 2014-08-28 LAB — CBC WITH DIFFERENTIAL/PLATELET
BASO%: 0.4 % (ref 0.0–2.0)
BASOS ABS: 0 10*3/uL (ref 0.0–0.1)
EOS%: 2.7 % (ref 0.0–7.0)
Eosinophils Absolute: 0.2 10*3/uL (ref 0.0–0.5)
HCT: 29.8 % — ABNORMAL LOW (ref 34.8–46.6)
HEMOGLOBIN: 9.7 g/dL — AB (ref 11.6–15.9)
LYMPH#: 0.6 10*3/uL — AB (ref 0.9–3.3)
LYMPH%: 10.7 % — ABNORMAL LOW (ref 14.0–49.7)
MCH: 32.7 pg (ref 25.1–34.0)
MCHC: 32.7 g/dL (ref 31.5–36.0)
MCV: 100 fL (ref 79.5–101.0)
MONO#: 0.6 10*3/uL (ref 0.1–0.9)
MONO%: 9.9 % (ref 0.0–14.0)
NEUT%: 76.3 % (ref 38.4–76.8)
NEUTROS ABS: 4.3 10*3/uL (ref 1.5–6.5)
Platelets: 213 10*3/uL (ref 145–400)
RBC: 2.98 10*6/uL — ABNORMAL LOW (ref 3.70–5.45)
RDW: 15.7 % — AB (ref 11.2–14.5)
WBC: 5.6 10*3/uL (ref 3.9–10.3)

## 2014-08-28 LAB — COMPREHENSIVE METABOLIC PANEL (CC13)
ALT: 38 U/L (ref 0–55)
AST: 17 U/L (ref 5–34)
Albumin: 3.7 g/dL (ref 3.5–5.0)
Alkaline Phosphatase: 159 U/L — ABNORMAL HIGH (ref 40–150)
Anion Gap: 9 mEq/L (ref 3–11)
BUN: 18 mg/dL (ref 7.0–26.0)
CALCIUM: 9.1 mg/dL (ref 8.4–10.4)
CHLORIDE: 106 meq/L (ref 98–109)
CO2: 25 mEq/L (ref 22–29)
Creatinine: 1.1 mg/dL (ref 0.6–1.1)
Glucose: 95 mg/dl (ref 70–140)
Potassium: 3.5 mEq/L (ref 3.5–5.1)
Sodium: 140 mEq/L (ref 136–145)
Total Bilirubin: 0.25 mg/dL (ref 0.20–1.20)
Total Protein: 7 g/dL (ref 6.4–8.3)

## 2014-08-28 LAB — URINALYSIS, MICROSCOPIC - CHCC
BILIRUBIN (URINE): NEGATIVE
Glucose: NEGATIVE mg/dL
KETONES: NEGATIVE mg/dL
Leukocyte Esterase: NEGATIVE
Nitrite: NEGATIVE
PH: 6.5 (ref 4.6–8.0)
Protein: 30 mg/dL
Specific Gravity, Urine: 1.015 (ref 1.003–1.035)
Urobilinogen, UR: 0.2 mg/dL (ref 0.2–1)

## 2014-08-28 MED ORDER — ONDANSETRON HCL 8 MG PO TABS
8.0000 mg | ORAL_TABLET | Freq: Once | ORAL | Status: AC
  Administered 2014-08-28: 8 mg via ORAL

## 2014-08-28 MED ORDER — BORTEZOMIB CHEMO SQ INJECTION 3.5 MG (2.5MG/ML)
1.3000 mg/m2 | Freq: Once | INTRAMUSCULAR | Status: AC
  Administered 2014-08-28: 1.75 mg via SUBCUTANEOUS
  Filled 2014-08-28: qty 1.75

## 2014-08-28 MED ORDER — CIPROFLOXACIN HCL 500 MG PO TABS: 500.0000 mg | ORAL_TABLET | Freq: Two times a day (BID) | ORAL | Status: DC

## 2014-08-28 MED ORDER — ONDANSETRON HCL 8 MG PO TABS
ORAL_TABLET | ORAL | Status: AC
  Filled 2014-08-28: qty 1

## 2014-08-28 NOTE — Patient Instructions (Addendum)
[facility] Discharge Instructions for Patients Receiving Chemotherapy  Today you received the following chemotherapy agents: Velcade. We called in a prescription for Cipro 500 mg twice daily x 7 days.    To help prevent nausea and vomiting after your treatment, we encourage you to take your nausea medication as directed.    If you develop nausea and vomiting that is not controlled by your nausea medication, call the clinic.   BELOW ARE SYMPTOMS THAT SHOULD BE REPORTED IMMEDIATELY:  *FEVER GREATER THAN 100.5 F  *CHILLS WITH OR WITHOUT FEVER  NAUSEA AND VOMITING THAT IS NOT CONTROLLED WITH YOUR NAUSEA MEDICATION  *UNUSUAL SHORTNESS OF BREATH  *UNUSUAL BRUISING OR BLEEDING  TENDERNESS IN MOUTH AND THROAT WITH OR WITHOUT PRESENCE OF ULCERS  *URINARY PROBLEMS  *BOWEL PROBLEMS  UNUSUAL RASH Items with * indicate a potential emergency and should be followed up as soon as possible.  Feel free to call the clinic you have any questions or concerns. The clinic phone number is [phone].

## 2014-08-28 NOTE — Telephone Encounter (Signed)
THIS REFILL REQUEST FOR REVLIMID WAS PLACED ON DR.GORSUCH'S NURSE'S DESK.

## 2014-08-28 NOTE — Progress Notes (Signed)
Pt c/o UTI-like symptoms (burning, frequency, change of urine color) Selena Lesser, NP notified.  UA and UCX sent, cipro rx called in for patient.  Will follow up with results of urine test.

## 2014-08-28 NOTE — Telephone Encounter (Signed)
lvm that ua showed only trace of blood. Ordered cipro for symptoms until culture comes back.

## 2014-08-29 LAB — URINE CULTURE

## 2014-08-31 ENCOUNTER — Other Ambulatory Visit: Payer: Self-pay | Admitting: Hematology and Oncology

## 2014-08-31 ENCOUNTER — Other Ambulatory Visit: Payer: Self-pay | Admitting: *Deleted

## 2014-08-31 ENCOUNTER — Ambulatory Visit (HOSPITAL_BASED_OUTPATIENT_CLINIC_OR_DEPARTMENT_OTHER): Payer: BC Managed Care – PPO

## 2014-08-31 DIAGNOSIS — C9000 Multiple myeloma not having achieved remission: Secondary | ICD-10-CM

## 2014-08-31 DIAGNOSIS — Z5112 Encounter for antineoplastic immunotherapy: Secondary | ICD-10-CM

## 2014-08-31 MED ORDER — LENALIDOMIDE 10 MG PO CAPS
10.0000 mg | ORAL_CAPSULE | Freq: Every day | ORAL | Status: DC
Start: 2014-08-31 — End: 2014-08-31

## 2014-08-31 MED ORDER — ONDANSETRON HCL 8 MG PO TABS
ORAL_TABLET | ORAL | Status: AC
  Filled 2014-08-31: qty 1

## 2014-08-31 MED ORDER — LENALIDOMIDE 10 MG PO CAPS: ORAL_CAPSULE | ORAL | Status: DC

## 2014-08-31 MED ORDER — DEXAMETHASONE 4 MG PO TABS: 40.0000 mg | ORAL_TABLET | ORAL | Status: DC

## 2014-08-31 MED ORDER — BORTEZOMIB CHEMO SQ INJECTION 3.5 MG (2.5MG/ML)
1.3000 mg/m2 | Freq: Once | INTRAMUSCULAR | Status: AC
  Administered 2014-08-31: 1.75 mg via SUBCUTANEOUS
  Filled 2014-08-31: qty 1.75

## 2014-08-31 MED ORDER — DEXAMETHASONE 4 MG PO TABS
ORAL_TABLET | ORAL | Status: AC
  Filled 2014-08-31: qty 10

## 2014-08-31 MED ORDER — DEXAMETHASONE 4 MG PO TABS
40.0000 mg | ORAL_TABLET | Freq: Once | ORAL | Status: AC
  Administered 2014-08-31: 40 mg via ORAL

## 2014-08-31 MED ORDER — ONDANSETRON HCL 8 MG PO TABS
8.0000 mg | ORAL_TABLET | Freq: Once | ORAL | Status: AC
  Administered 2014-08-31: 8 mg via ORAL

## 2014-08-31 NOTE — Addendum Note (Signed)
Addended by: Wyonia Hough on: 08/31/2014 04:18 PM   Modules accepted: Orders

## 2014-08-31 NOTE — Patient Instructions (Signed)
[facility] Discharge Instructions for Patients Receiving Chemotherapy  Today you received the following chemotherapy agent Velcade  To help prevent nausea and vomiting after your treatment, we encourage you to take your nausea medication.   If you develop nausea and vomiting that is not controlled by your nausea medication, call the clinic.   BELOW ARE SYMPTOMS THAT SHOULD BE REPORTED IMMEDIATELY:  *FEVER GREATER THAN 100.5 F  *CHILLS WITH OR WITHOUT FEVER  NAUSEA AND VOMITING THAT IS NOT CONTROLLED WITH YOUR NAUSEA MEDICATION  *UNUSUAL SHORTNESS OF BREATH  *UNUSUAL BRUISING OR BLEEDING  TENDERNESS IN MOUTH AND THROAT WITH OR WITHOUT PRESENCE OF ULCERS  *URINARY PROBLEMS  *BOWEL PROBLEMS  UNUSUAL RASH Items with * indicate a potential emergency and should be followed up as soon as possible.  Feel free to call the clinic you have any questions or concerns. The clinic phone number is [phone].

## 2014-08-31 NOTE — Addendum Note (Signed)
Addended by: Wyonia Hough on: 08/31/2014 04:12 PM   Modules accepted: Orders

## 2014-08-31 NOTE — Progress Notes (Signed)
1305 V.O. R & A, per Dr. Alvy Bimler, patient to take 40 mg Decadron p.o today with velcade injection d/t patient forgetting to take at home.

## 2014-09-01 LAB — SPEP & IFE WITH QIG
ALBUMIN ELP: 58.8 % (ref 55.8–66.1)
Alpha-1-Globulin: 5.6 % — ABNORMAL HIGH (ref 2.9–4.9)
Alpha-2-Globulin: 14.3 % — ABNORMAL HIGH (ref 7.1–11.8)
BETA 2: 11 % — AB (ref 3.2–6.5)
Beta Globulin: 5.8 % (ref 4.7–7.2)
Gamma Globulin: 4.5 % — ABNORMAL LOW (ref 11.1–18.8)
IGA: 385 mg/dL — AB (ref 69–380)
IGM, SERUM: 51 mg/dL — AB (ref 52–322)
IgG (Immunoglobin G), Serum: 356 mg/dL — ABNORMAL LOW (ref 690–1700)
M-Spike, %: 0.42 g/dL
Total Protein, Serum Electrophoresis: 6.5 g/dL (ref 6.0–8.3)

## 2014-09-01 LAB — KAPPA/LAMBDA LIGHT CHAINS
KAPPA FREE LGHT CHN: 81.9 mg/dL — AB (ref 0.33–1.94)
KAPPA LAMBDA RATIO: 96.35 — AB (ref 0.26–1.65)
Lambda Free Lght Chn: 0.85 mg/dL (ref 0.57–2.63)

## 2014-09-03 ENCOUNTER — Other Ambulatory Visit (HOSPITAL_COMMUNITY): Payer: Self-pay | Admitting: Interventional Radiology

## 2014-09-03 ENCOUNTER — Ambulatory Visit (HOSPITAL_COMMUNITY)
Admission: RE | Admit: 2014-09-03 | Discharge: 2014-09-03 | Disposition: A | Discharge status: Home / Self Care | Payer: BC Managed Care – PPO | Source: Ambulatory Visit | Attending: Interventional Radiology | Admitting: Interventional Radiology

## 2014-09-03 DIAGNOSIS — M899 Disorder of bone, unspecified: Secondary | ICD-10-CM

## 2014-09-03 DIAGNOSIS — T148XXA Other injury of unspecified body region, initial encounter: Secondary | ICD-10-CM

## 2014-09-03 DIAGNOSIS — M549 Dorsalgia, unspecified: Secondary | ICD-10-CM

## 2014-09-03 DIAGNOSIS — C9000 Multiple myeloma not having achieved remission: Secondary | ICD-10-CM

## 2014-09-03 DIAGNOSIS — IMO0002 Reserved for concepts with insufficient information to code with codable children: Secondary | ICD-10-CM

## 2014-09-03 DIAGNOSIS — M949 Disorder of cartilage, unspecified: Secondary | ICD-10-CM

## 2014-09-05 ENCOUNTER — Telehealth: Payer: Self-pay | Admitting: Hematology and Oncology

## 2014-09-05 NOTE — Telephone Encounter (Signed)
returned pt call and lvm confirming appt °

## 2014-09-07 ENCOUNTER — Other Ambulatory Visit: Payer: Self-pay | Admitting: Hematology and Oncology

## 2014-09-11 ENCOUNTER — Ambulatory Visit (HOSPITAL_BASED_OUTPATIENT_CLINIC_OR_DEPARTMENT_OTHER): Payer: BC Managed Care – PPO

## 2014-09-11 ENCOUNTER — Ambulatory Visit (HOSPITAL_BASED_OUTPATIENT_CLINIC_OR_DEPARTMENT_OTHER): Payer: BC Managed Care – PPO | Admitting: Hematology and Oncology

## 2014-09-11 ENCOUNTER — Encounter: Payer: Self-pay | Admitting: Hematology and Oncology

## 2014-09-11 ENCOUNTER — Telehealth: Payer: Self-pay | Admitting: Hematology and Oncology

## 2014-09-11 ENCOUNTER — Other Ambulatory Visit: Payer: Self-pay | Admitting: *Deleted

## 2014-09-11 VITALS — BP 123/67 | HR 98 | Temp 98.6°F | Resp 17 | Ht 60.25 in | Wt 102.3 lb

## 2014-09-11 DIAGNOSIS — C9000 Multiple myeloma not having achieved remission: Secondary | ICD-10-CM

## 2014-09-11 DIAGNOSIS — Z5112 Encounter for antineoplastic immunotherapy: Secondary | ICD-10-CM

## 2014-09-11 DIAGNOSIS — M899 Disorder of bone, unspecified: Secondary | ICD-10-CM

## 2014-09-11 DIAGNOSIS — D630 Anemia in neoplastic disease: Secondary | ICD-10-CM

## 2014-09-11 DIAGNOSIS — M898X9 Other specified disorders of bone, unspecified site: Secondary | ICD-10-CM

## 2014-09-11 DIAGNOSIS — R609 Edema, unspecified: Secondary | ICD-10-CM

## 2014-09-11 DIAGNOSIS — R300 Dysuria: Secondary | ICD-10-CM

## 2014-09-11 HISTORY — DX: Edema, unspecified: R60.9

## 2014-09-11 LAB — CBC WITH DIFFERENTIAL/PLATELET
BASO%: 0.3 % (ref 0.0–2.0)
Basophils Absolute: 0 10*3/uL (ref 0.0–0.1)
EOS%: 0 % (ref 0.0–7.0)
Eosinophils Absolute: 0 10*3/uL (ref 0.0–0.5)
HEMATOCRIT: 28.9 % — AB (ref 34.8–46.6)
HGB: 9.4 g/dL — ABNORMAL LOW (ref 11.6–15.9)
LYMPH#: 0.1 10*3/uL — AB (ref 0.9–3.3)
LYMPH%: 2.3 % — ABNORMAL LOW (ref 14.0–49.7)
MCH: 32.2 pg (ref 25.1–34.0)
MCHC: 32.5 g/dL (ref 31.5–36.0)
MCV: 99.2 fL (ref 79.5–101.0)
MONO#: 0 10*3/uL — ABNORMAL LOW (ref 0.1–0.9)
MONO%: 0.9 % (ref 0.0–14.0)
NEUT%: 96.5 % — ABNORMAL HIGH (ref 38.4–76.8)
NEUTROS ABS: 5.2 10*3/uL (ref 1.5–6.5)
Platelets: 255 10*3/uL (ref 145–400)
RBC: 2.91 10*6/uL — ABNORMAL LOW (ref 3.70–5.45)
RDW: 14.2 % (ref 11.2–14.5)
WBC: 5.3 10*3/uL (ref 3.9–10.3)

## 2014-09-11 LAB — COMPREHENSIVE METABOLIC PANEL (CC13)
ALBUMIN: 3.6 g/dL (ref 3.5–5.0)
ALT: 41 U/L (ref 0–55)
ANION GAP: 8 meq/L (ref 3–11)
AST: 20 U/L (ref 5–34)
Alkaline Phosphatase: 186 U/L — ABNORMAL HIGH (ref 40–150)
BUN: 18.2 mg/dL (ref 7.0–26.0)
CO2: 23 mEq/L (ref 22–29)
Calcium: 9 mg/dL (ref 8.4–10.4)
Chloride: 108 mEq/L (ref 98–109)
Creatinine: 1.2 mg/dL — ABNORMAL HIGH (ref 0.6–1.1)
Glucose: 191 mg/dl — ABNORMAL HIGH (ref 70–140)
Potassium: 3.8 mEq/L (ref 3.5–5.1)
SODIUM: 139 meq/L (ref 136–145)
TOTAL PROTEIN: 6.7 g/dL (ref 6.4–8.3)
Total Bilirubin: 0.28 mg/dL (ref 0.20–1.20)

## 2014-09-11 MED ORDER — CIPROFLOXACIN HCL 250 MG PO TABS: 250.0000 mg | ORAL_TABLET | Freq: Two times a day (BID) | ORAL | Status: DC

## 2014-09-11 MED ORDER — ONDANSETRON HCL 8 MG PO TABS
8.0000 mg | ORAL_TABLET | Freq: Once | ORAL | Status: AC
  Administered 2014-09-11: 8 mg via ORAL

## 2014-09-11 MED ORDER — BORTEZOMIB CHEMO SQ INJECTION 3.5 MG (2.5MG/ML)
1.3000 mg/m2 | Freq: Once | INTRAMUSCULAR | Status: AC
  Administered 2014-09-11: 1.75 mg via SUBCUTANEOUS
  Filled 2014-09-11: qty 1.75

## 2014-09-11 MED ORDER — LENALIDOMIDE 10 MG PO CAPS: ORAL_CAPSULE | ORAL | Status: DC

## 2014-09-11 MED ORDER — FUROSEMIDE 20 MG PO TABS: 20.0000 mg | ORAL_TABLET | Freq: Every day | ORAL | Status: DC

## 2014-09-11 MED ORDER — ONDANSETRON HCL 8 MG PO TABS
ORAL_TABLET | ORAL | Status: AC
  Filled 2014-09-11: qty 1

## 2014-09-11 NOTE — Assessment & Plan Note (Signed)
Overall, she tolerated her treatment well. The serum creatinine continues to improve. The patient is interested to pursue bone marrow transplant. After 4 cycles of induction chemotherapy, we'll proceed to restage her with repeat bone marrow aspirate and biopsy at the end of the month

## 2014-09-11 NOTE — Patient Instructions (Signed)
[facility] Discharge Instructions for Patients Receiving Chemotherapy  Today you received the following chemotherapy agents: Velcade.  To help prevent nausea and vomiting after your treatment, we encourage you to take your nausea medication as prescribed.   If you develop nausea and vomiting that is not controlled by your nausea medication, call the clinic.   BELOW ARE SYMPTOMS THAT SHOULD BE REPORTED IMMEDIATELY:  *FEVER GREATER THAN 100.5 F  *CHILLS WITH OR WITHOUT FEVER  NAUSEA AND VOMITING THAT IS NOT CONTROLLED WITH YOUR NAUSEA MEDICATION  *UNUSUAL SHORTNESS OF BREATH  *UNUSUAL BRUISING OR BLEEDING  TENDERNESS IN MOUTH AND THROAT WITH OR WITHOUT PRESENCE OF ULCERS  *URINARY PROBLEMS  *BOWEL PROBLEMS  UNUSUAL RASH Items with * indicate a potential emergency and should be followed up as soon as possible.  Feel free to call the clinic you have any questions or concerns. The clinic phone number is [phone].    

## 2014-09-11 NOTE — Assessment & Plan Note (Signed)
I recommend prophylactic antibiotics prior to kyphoplasty with ciprofloxacin.

## 2014-09-11 NOTE — Assessment & Plan Note (Signed)

## 2014-09-11 NOTE — Telephone Encounter (Signed)
gv and printed appt sched and avs for pt for OCT and NOV....sed added tx. °

## 2014-09-11 NOTE — Progress Notes (Signed)
[place] OFFICE PROGRESS NOTE  Patient Care Team: Hulan Fess, MD as PCP - General (Family Medicine) Rob Hickman, MD as Consulting Physician (Interventional Radiology)  SUMMARY OF ONCOLOGIC HISTORY: Oncology History   Multiple myeloma without remission; Calcium 13.3, Creatinine 2.83, Hg 8.3, Bone lesions present, Beta 2 microglobulin 12.8, Albumin 3.5, IgA 1760 and kappa light chains 1670. Bone marrow biopsy showed 82% involvement.   Primary site: Multiple Myeloma   Staging method: AJCC 6th Edition   Clinical: Stage IIIB signed by Heath Lark, MD on 07/13/2014 12:38 PM   Summary: Stage IIIB       Multiple myeloma without remission   07/07/2014 - 07/10/2014 Hospital Admission She was admitted to the hospital for management of malignant hypercalcemia and had bone marrow biopsy which confirmed diagnosis of multiple myeloma. The patient received one unit of blood transfusion due to severe anemia.   07/08/2014 Bone Marrow Biopsy The patient had bone marrow biopsy that confirmed diagnosis.   07/08/2014 Pathology Results Accession: [id number] showed 82% involvement of bone marrow.   07/08/2014 -  Chemotherapy She was started on Velcade along with dexamethasone.   08/21/2014 -  Chemotherapy I added Revlimid and Zometa to her chemotherapy regimen.    INTERVAL HISTORY: Please see below for problem oriented charting. She is doing well. Her bone pain is improving. She is anxious about her ablation procedure. She had recent dysuria, resolved. She had mild nausea, resolved with antiemetics. She complained of bilateral leg edema.  REVIEW OF SYSTEMS:   Constitutional: Denies fevers, chills or abnormal weight loss Eyes: Denies blurriness of vision Ears, nose, mouth, throat, and face: Denies mucositis or sore throat Respiratory: Denies cough, dyspnea or wheezes Cardiovascular: Denies palpitation, chest discomfort  Gastrointestinal:  Denies nausea, heartburn or change in bowel  habits Skin: Denies abnormal skin rashes Lymphatics: Denies new lymphadenopathy or easy bruising Neurological:Denies numbness, tingling or new weaknesses Behavioral/Psych: Mood is stable, no new changes  All other systems were reviewed with the patient and are negative.  I have reviewed the past medical history, past surgical history, social history and family history with the patient and they are unchanged from previous note.  ALLERGIES:  is allergic to ace inhibitors; hydrocodone; and cephalexin.  MEDICATIONS:  Current Outpatient Prescriptions  Medication Sig Dispense Refill  . acyclovir (ZOVIRAX) 400 MG tablet Take 1 tablet (400 mg total) by mouth daily.  30 tablet  6  . amLODipine (NORVASC) 10 MG tablet Take 1 tablet (10 mg total) by mouth daily.  30 tablet  1  . aspirin 81 MG tablet Take 81 mg by mouth daily.      Marland Kitchen atorvastatin (LIPITOR) 10 MG tablet Take 10 mg by mouth every morning.       . Calcium-Phosphorus-Vitamin D (CITRACAL +D3 PO) Take by mouth. 500 u-469m      . cholecalciferol (VITAMIN D) 1000 UNITS tablet Take 2,000 Units by mouth every morning.       .Marland Kitchendexamethasone (DECADRON) 4 MG tablet TAKE 10 TABLETS TWICE A WEEK ON THE DAYS OF CHEMO  90 tablet  1  . HYDROmorphone (DILAUDID) 4 MG tablet Take 1 tablet (4 mg total) by mouth every 4 (four) hours as needed for severe pain.  90 tablet  0  . irbesartan-hydrochlorothiazide (AVALIDE) 300-12.5 MG per tablet       . lenalidomide (REVLIMID) 10 MG capsule TAKE ONE CAPSULE DAILY X14 DAYS, THEN 7 DAYS OFF  14 capsule  0  . morphine (MS CONTIN) 30  MG 12 hr tablet Take 1 tablet (30 mg total) by mouth every 12 (twelve) hours.  60 tablet  0  . omeprazole (PRILOSEC) 20 MG capsule Take 20 mg by mouth every morning.       . polyvinyl alcohol (LIQUIFILM TEARS) 1.4 % ophthalmic solution Place 1-2 drops into both eyes 2 (two) times daily as needed for dry eyes.       . promethazine (PHENERGAN) 25 MG tablet Take 1 tablet (25 mg total) by  mouth every 6 (six) hours as needed for nausea or vomiting.  60 tablet  3  . senna-docusate (SENOKOT-S) 8.6-50 MG per tablet Take 2 tablets by mouth 2 (two) times daily.  60 tablet  3  . ciprofloxacin (CIPRO) 250 MG tablet Take 1 tablet (250 mg total) by mouth 2 (two) times daily.  14 tablet  0  . furosemide (LASIX) 20 MG tablet Take 1 tablet (20 mg total) by mouth daily.  30 tablet  3   No current facility-administered medications for this visit.    PHYSICAL EXAMINATION: ECOG PERFORMANCE STATUS: 1 - Symptomatic but completely ambulatory  Filed Vitals:   09/11/14 1246  BP: 123/67  Pulse: 98  Temp: 98.6 F (37 C)  Resp: 17   Filed Weights   09/11/14 1246  Weight: 102 lb 4.8 oz (46.403 kg)    GENERAL:alert, no distress and comfortable. She looks thin and cachectic SKIN: skin color, texture, turgor are normal, no rashes or significant lesions EYES: normal, Conjunctiva are pink and non-injected, sclera clear HEART: Bilateral lower extremity edema Musculoskeletal:no cyanosis of digits and no clubbing  NEURO: alert & oriented x 3 with fluent speech, no focal motor/sensory deficits  LABORATORY DATA:  I have reviewed the data as listed    Component Value Date/Time   NA 139 09/11/2014 1237   NA 136 07/13/2014 1222   K 3.8 09/11/2014 1237   K 3.3* 07/13/2014 1222   CL 100 07/13/2014 1222   CO2 23 09/11/2014 1237   CO2 24 07/13/2014 1222   GLUCOSE 191* 09/11/2014 1237   GLUCOSE 91 07/13/2014 1222   BUN 18.2 09/11/2014 1237   BUN 23 07/13/2014 1222   CREATININE 1.2* 09/11/2014 1237   CREATININE 2.05* 07/13/2014 1222   CALCIUM 9.0 09/11/2014 1237   CALCIUM 10.4 07/13/2014 1222   PROT 6.7 09/11/2014 1237   PROT 7.2 07/13/2014 1222   ALBUMIN 3.6 09/11/2014 1237   ALBUMIN 3.9 07/13/2014 1222   AST 20 09/11/2014 1237   AST 22 07/13/2014 1222   ALT 41 09/11/2014 1237   ALT 75* 07/13/2014 1222   ALKPHOS 186* 09/11/2014 1237   ALKPHOS 90 07/13/2014 1222   BILITOT 0.28 09/11/2014 1237   BILITOT 0.4  07/13/2014 1222   GFRNONAA 20* 07/10/2014 0445   GFRAA 23* 07/10/2014 0445    No results found for this basename: SPEP, UPEP,  kappa and lambda light chains    Lab Results  Component Value Date   WBC 5.3 09/11/2014   NEUTROABS 5.2 09/11/2014   HGB 9.4* 09/11/2014   HCT 28.9* 09/11/2014   MCV 99.2 09/11/2014   PLT 255 09/11/2014      Chemistry      Component Value Date/Time   NA 139 09/11/2014 1237   NA 136 07/13/2014 1222   K 3.8 09/11/2014 1237   K 3.3* 07/13/2014 1222   CL 100 07/13/2014 1222   CO2 23 09/11/2014 1237   CO2 24 07/13/2014 1222   BUN 18.2 09/11/2014 1237  BUN 23 07/13/2014 1222   CREATININE 1.2* 09/11/2014 1237   CREATININE 2.05* 07/13/2014 1222      Component Value Date/Time   CALCIUM 9.0 09/11/2014 1237   CALCIUM 10.4 07/13/2014 1222   ALKPHOS 186* 09/11/2014 1237   ALKPHOS 90 07/13/2014 1222   AST 20 09/11/2014 1237   AST 22 07/13/2014 1222   ALT 41 09/11/2014 1237   ALT 75* 07/13/2014 1222   BILITOT 0.28 09/11/2014 1237   BILITOT 0.4 07/13/2014 1222      ASSESSMENT & PLAN:  Multiple myeloma without remission Overall, she tolerated her treatment well. The serum creatinine continues to improve. The patient is interested to pursue bone marrow transplant. After 4 cycles of induction chemotherapy, we'll proceed to restage her with repeat bone marrow aspirate and biopsy at the end of the month   Bone pain Her bone pain has improved with the use of long-acting morphine sulfate. We will continue the same.   Compression fracture She has been referred for kyphoplasty.  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 The patient denies recent history of bleeding such as epistaxis, hematuria or hematochezia. She is asymptomatic from the anemia. I will observe for now.  She does not require transfusion now. I will continue the chemotherapy at current dose without dosage adjustment.  If the anemia gets progressive worse in the future, I might have to delay  her treatment or adjust the chemotherapy dose.  Dysuria I recommend prophylactic antibiotics prior to kyphoplasty with ciprofloxacin.  Edema She has excessive fluid retention related to dexamethasone. I would reduce it to 20 mg weekly. I have discontinued amlodipine and substitute with Lasix to take as needed.   No orders of the defined types were placed in this encounter.   All questions were answered. The patient knows to call the clinic with any problems, questions or concerns. No barriers to learning was detected. I spent 40 minutes counseling the patient face to face. The total time spent in the appointment was 55 minutes and more than 50% was on counseling and review of test results     [facility], [place], MD 09/11/2014 1:38 PM

## 2014-09-11 NOTE — Assessment & Plan Note (Signed)
Her bone pain has improved with the use of long-acting morphine sulfate. We will continue the same.

## 2014-09-11 NOTE — Assessment & Plan Note (Signed)
She has excessive fluid retention related to dexamethasone. I would reduce it to 20 mg weekly. I have discontinued amlodipine and substitute with Lasix to take as needed.

## 2014-09-11 NOTE — Telephone Encounter (Signed)
Revlimid refill faxed to [facility].  Called [facility] and confirmed they received the Rx and they will try to contact pt today to arrange delivery for tomorrow.

## 2014-09-11 NOTE — Assessment & Plan Note (Signed)
She has been referred for kyphoplasty.

## 2014-09-14 ENCOUNTER — Ambulatory Visit (HOSPITAL_BASED_OUTPATIENT_CLINIC_OR_DEPARTMENT_OTHER): Payer: BC Managed Care – PPO

## 2014-09-14 VITALS — BP 137/78 | HR 89 | Temp 98.6°F | Resp 18

## 2014-09-14 DIAGNOSIS — C9000 Multiple myeloma not having achieved remission: Secondary | ICD-10-CM

## 2014-09-14 DIAGNOSIS — Z5112 Encounter for antineoplastic immunotherapy: Secondary | ICD-10-CM

## 2014-09-14 MED ORDER — ONDANSETRON HCL 8 MG PO TABS
ORAL_TABLET | ORAL | Status: AC
  Filled 2014-09-14: qty 1

## 2014-09-14 MED ORDER — BORTEZOMIB CHEMO SQ INJECTION 3.5 MG (2.5MG/ML)
1.3000 mg/m2 | Freq: Once | INTRAMUSCULAR | Status: AC
  Administered 2014-09-14: 1.75 mg via SUBCUTANEOUS
  Filled 2014-09-14: qty 1.75

## 2014-09-14 MED ORDER — ONDANSETRON HCL 8 MG PO TABS
8.0000 mg | ORAL_TABLET | Freq: Once | ORAL | Status: AC
  Administered 2014-09-14: 8 mg via ORAL

## 2014-09-14 NOTE — Patient Instructions (Signed)
[facility] Discharge Instructions for Patients Receiving Chemotherapy  Today you received the following chemotherapy agents velcade   To help prevent nausea and vomiting after your treatment, we encourage you to take your nausea medication as directed  If you develop nausea and vomiting that is not controlled by your nausea medication, call the clinic.   BELOW ARE SYMPTOMS THAT SHOULD BE REPORTED IMMEDIATELY:  *FEVER GREATER THAN 100.5 F  *CHILLS WITH OR WITHOUT FEVER  NAUSEA AND VOMITING THAT IS NOT CONTROLLED WITH YOUR NAUSEA MEDICATION  *UNUSUAL SHORTNESS OF BREATH  *UNUSUAL BRUISING OR BLEEDING  TENDERNESS IN MOUTH AND THROAT WITH OR WITHOUT PRESENCE OF ULCERS  *URINARY PROBLEMS  *BOWEL PROBLEMS  UNUSUAL RASH Items with * indicate a potential emergency and should be followed up as soon as possible.  Feel free to call the clinic you have any questions or concerns. The clinic phone number is [phone].  

## 2014-09-16 ENCOUNTER — Telehealth: Payer: Self-pay | Admitting: *Deleted

## 2014-09-16 DIAGNOSIS — C9000 Multiple myeloma not having achieved remission: Secondary | ICD-10-CM

## 2014-09-16 DIAGNOSIS — M898X9 Other specified disorders of bone, unspecified site: Secondary | ICD-10-CM

## 2014-09-16 NOTE — Telephone Encounter (Signed)
Just weekly 5 tabs on Mondays

## 2014-09-16 NOTE — Telephone Encounter (Signed)
Pt asked for clarification on her Dexamethasone dose.  Says she was instructed to decrease to Five tablets.  She took five tabs (20 mg) on this past Monday.  Does she take 20 mg again this Friday and continue twice weekly on Mondays and Fridays OR does she take again this Friday and take once weekly on Fridays from now on OR does she take weekly on Mondays??   Pt also reports she received her Revlimid and started taking today.

## 2014-09-17 MED ORDER — HYDROMORPHONE HCL 4 MG PO TABS
4.0000 mg | ORAL_TABLET | ORAL | Status: DC | PRN
Start: 2014-09-17 — End: 2014-09-24

## 2014-09-17 NOTE — Telephone Encounter (Signed)
LVM for pt to return nurse's call.

## 2014-09-17 NOTE — Telephone Encounter (Signed)
Instructed pt to take Dexamethasone 20 mg once a week on Mondays.  She verbalized understanding.  She also requests refill on Hydromorphone.  She can pick up Rx tomorrow.  Informed pt will leave Rx for her to pick up tomorrow.

## 2014-09-18 ENCOUNTER — Encounter: Payer: Self-pay | Admitting: *Deleted

## 2014-09-18 ENCOUNTER — Other Ambulatory Visit (HOSPITAL_BASED_OUTPATIENT_CLINIC_OR_DEPARTMENT_OTHER): Payer: BC Managed Care – PPO

## 2014-09-18 ENCOUNTER — Ambulatory Visit (HOSPITAL_BASED_OUTPATIENT_CLINIC_OR_DEPARTMENT_OTHER): Payer: BC Managed Care – PPO

## 2014-09-18 VITALS — BP 147/70 | HR 101 | Temp 98.4°F | Resp 18

## 2014-09-18 DIAGNOSIS — Z5112 Encounter for antineoplastic immunotherapy: Secondary | ICD-10-CM

## 2014-09-18 DIAGNOSIS — C9000 Multiple myeloma not having achieved remission: Secondary | ICD-10-CM

## 2014-09-18 DIAGNOSIS — R300 Dysuria: Secondary | ICD-10-CM

## 2014-09-18 LAB — COMPREHENSIVE METABOLIC PANEL (CC13)
ALBUMIN: 3.5 g/dL (ref 3.5–5.0)
ALT: 25 U/L (ref 0–55)
ANION GAP: 10 meq/L (ref 3–11)
AST: 16 U/L (ref 5–34)
Alkaline Phosphatase: 158 U/L — ABNORMAL HIGH (ref 40–150)
BUN: 16.3 mg/dL (ref 7.0–26.0)
CO2: 26 meq/L (ref 22–29)
Calcium: 9.2 mg/dL (ref 8.4–10.4)
Chloride: 107 mEq/L (ref 98–109)
Creatinine: 1.3 mg/dL — ABNORMAL HIGH (ref 0.6–1.1)
Glucose: 118 mg/dl (ref 70–140)
POTASSIUM: 3.9 meq/L (ref 3.5–5.1)
SODIUM: 143 meq/L (ref 136–145)
TOTAL PROTEIN: 6.2 g/dL — AB (ref 6.4–8.3)
Total Bilirubin: 0.28 mg/dL (ref 0.20–1.20)

## 2014-09-18 LAB — URINALYSIS, MICROSCOPIC - CHCC
Bilirubin (Urine): NEGATIVE
Blood: NEGATIVE
Glucose: NEGATIVE mg/dL
Ketones: NEGATIVE mg/dL
LEUKOCYTE ESTERASE: NEGATIVE
NITRITE: NEGATIVE
PH: 6 (ref 4.6–8.0)
PROTEIN: 30 mg/dL
Specific Gravity, Urine: 1.025 (ref 1.003–1.035)
UROBILINOGEN UR: 0.2 mg/dL (ref 0.2–1)

## 2014-09-18 LAB — CBC WITH DIFFERENTIAL/PLATELET
BASO%: 0.7 % (ref 0.0–2.0)
Basophils Absolute: 0 10*3/uL (ref 0.0–0.1)
EOS%: 2.1 % (ref 0.0–7.0)
Eosinophils Absolute: 0.1 10*3/uL (ref 0.0–0.5)
HCT: 30.6 % — ABNORMAL LOW (ref 34.8–46.6)
HGB: 10 g/dL — ABNORMAL LOW (ref 11.6–15.9)
LYMPH#: 0.8 10*3/uL — AB (ref 0.9–3.3)
LYMPH%: 18.3 % (ref 14.0–49.7)
MCH: 32.4 pg (ref 25.1–34.0)
MCHC: 32.7 g/dL (ref 31.5–36.0)
MCV: 99 fL (ref 79.5–101.0)
MONO#: 0.6 10*3/uL (ref 0.1–0.9)
MONO%: 12.8 % (ref 0.0–14.0)
NEUT#: 2.9 10*3/uL (ref 1.5–6.5)
NEUT%: 66.1 % (ref 38.4–76.8)
PLATELETS: 213 10*3/uL (ref 145–400)
RBC: 3.09 10*6/uL — AB (ref 3.70–5.45)
RDW: 13.9 % (ref 11.2–14.5)
WBC: 4.4 10*3/uL (ref 3.9–10.3)

## 2014-09-18 MED ORDER — ONDANSETRON HCL 8 MG PO TABS
8.0000 mg | ORAL_TABLET | Freq: Once | ORAL | Status: AC
  Administered 2014-09-18: 8 mg via ORAL

## 2014-09-18 MED ORDER — ONDANSETRON HCL 8 MG PO TABS
ORAL_TABLET | ORAL | Status: AC
  Filled 2014-09-18: qty 1

## 2014-09-18 MED ORDER — BORTEZOMIB CHEMO SQ INJECTION 3.5 MG (2.5MG/ML)
1.3000 mg/m2 | Freq: Once | INTRAMUSCULAR | Status: AC
  Administered 2014-09-18: 1.75 mg via SUBCUTANEOUS
  Filled 2014-09-18: qty 1.75

## 2014-09-18 NOTE — Progress Notes (Signed)
Per Dr. Elson Areas okay to proceed with treatment today with HR of 101 and without CMET results from today.

## 2014-09-18 NOTE — Progress Notes (Signed)
Informed pt of BMBx moved from 10/30 to 10/28.  She verbalized understanding.  Gave her results of u/a negative for UTI today.  She verbalized understanding.

## 2014-09-18 NOTE — Patient Instructions (Signed)
[facility] Discharge Instructions for Patients Receiving Chemotherapy  Today you received the following chemotherapy agents: Velcade.  To help prevent nausea and vomiting after your treatment, we encourage you to take your nausea medication as prescribed.   If you develop nausea and vomiting that is not controlled by your nausea medication, call the clinic.   BELOW ARE SYMPTOMS THAT SHOULD BE REPORTED IMMEDIATELY:  *FEVER GREATER THAN 100.5 F  *CHILLS WITH OR WITHOUT FEVER  NAUSEA AND VOMITING THAT IS NOT CONTROLLED WITH YOUR NAUSEA MEDICATION  *UNUSUAL SHORTNESS OF BREATH  *UNUSUAL BRUISING OR BLEEDING  TENDERNESS IN MOUTH AND THROAT WITH OR WITHOUT PRESENCE OF ULCERS  *URINARY PROBLEMS  *BOWEL PROBLEMS  UNUSUAL RASH Items with * indicate a potential emergency and should be followed up as soon as possible.  Feel free to call the clinic you have any questions or concerns. The clinic phone number is [phone].    

## 2014-09-19 LAB — URINE CULTURE

## 2014-09-21 ENCOUNTER — Ambulatory Visit (HOSPITAL_BASED_OUTPATIENT_CLINIC_OR_DEPARTMENT_OTHER): Payer: BC Managed Care – PPO

## 2014-09-21 ENCOUNTER — Other Ambulatory Visit: Payer: Self-pay | Admitting: Radiology

## 2014-09-21 VITALS — BP 115/69 | HR 90 | Temp 98.8°F | Resp 18

## 2014-09-21 DIAGNOSIS — C9000 Multiple myeloma not having achieved remission: Secondary | ICD-10-CM

## 2014-09-21 DIAGNOSIS — Z5112 Encounter for antineoplastic immunotherapy: Secondary | ICD-10-CM

## 2014-09-21 MED ORDER — ONDANSETRON HCL 8 MG PO TABS
8.0000 mg | ORAL_TABLET | Freq: Once | ORAL | Status: AC
  Administered 2014-09-21: 8 mg via ORAL

## 2014-09-21 MED ORDER — BORTEZOMIB CHEMO SQ INJECTION 3.5 MG (2.5MG/ML)
1.3000 mg/m2 | Freq: Once | INTRAMUSCULAR | Status: AC
  Administered 2014-09-21: 1.75 mg via SUBCUTANEOUS
  Filled 2014-09-21: qty 1.75

## 2014-09-21 MED ORDER — ONDANSETRON HCL 8 MG PO TABS
ORAL_TABLET | ORAL | Status: AC
  Filled 2014-09-21: qty 1

## 2014-09-21 NOTE — Progress Notes (Signed)
Per Dr. Alvy Bimler, no labs needed today: use labs from 09/18/14.

## 2014-09-21 NOTE — Patient Instructions (Signed)
[facility] Discharge Instructions for Patients Receiving Chemotherapy  Today you received the following chemotherapy agents: Velcade  To help prevent nausea and vomiting after your treatment, we encourage you to take your nausea medication as prescribed by your physician.   If you develop nausea and vomiting that is not controlled by your nausea medication, call the clinic.   BELOW ARE SYMPTOMS THAT SHOULD BE REPORTED IMMEDIATELY:  *FEVER GREATER THAN 100.5 F  *CHILLS WITH OR WITHOUT FEVER  NAUSEA AND VOMITING THAT IS NOT CONTROLLED WITH YOUR NAUSEA MEDICATION  *UNUSUAL SHORTNESS OF BREATH  *UNUSUAL BRUISING OR BLEEDING  TENDERNESS IN MOUTH AND THROAT WITH OR WITHOUT PRESENCE OF ULCERS  *URINARY PROBLEMS  *BOWEL PROBLEMS  UNUSUAL RASH Items with * indicate a potential emergency and should be followed up as soon as possible.  Feel free to call the clinic you have any questions or concerns. The clinic phone number is [phone].    

## 2014-09-22 ENCOUNTER — Other Ambulatory Visit: Payer: Self-pay | Admitting: Radiology

## 2014-09-24 ENCOUNTER — Ambulatory Visit (HOSPITAL_COMMUNITY)
Admission: RE | Admit: 2014-09-24 | Discharge: 2014-09-24 | Disposition: A | Discharge status: Home / Self Care | Payer: BC Managed Care – PPO | Source: Ambulatory Visit | Attending: Interventional Radiology | Admitting: Interventional Radiology

## 2014-09-24 ENCOUNTER — Other Ambulatory Visit (HOSPITAL_COMMUNITY): Payer: Self-pay | Admitting: Interventional Radiology

## 2014-09-24 ENCOUNTER — Encounter (HOSPITAL_COMMUNITY): Payer: Self-pay

## 2014-09-24 DIAGNOSIS — C9000 Multiple myeloma not having achieved remission: Secondary | ICD-10-CM | POA: Diagnosis not present

## 2014-09-24 DIAGNOSIS — M8468XA Pathological fracture in other disease, other site, initial encounter for fracture: Secondary | ICD-10-CM | POA: Diagnosis not present

## 2014-09-24 DIAGNOSIS — Z7982 Long term (current) use of aspirin: Secondary | ICD-10-CM | POA: Diagnosis not present

## 2014-09-24 DIAGNOSIS — E785 Hyperlipidemia, unspecified: Secondary | ICD-10-CM | POA: Insufficient documentation

## 2014-09-24 DIAGNOSIS — M549 Dorsalgia, unspecified: Secondary | ICD-10-CM

## 2014-09-24 DIAGNOSIS — IMO0002 Reserved for concepts with insufficient information to code with codable children: Secondary | ICD-10-CM

## 2014-09-24 DIAGNOSIS — N189 Chronic kidney disease, unspecified: Secondary | ICD-10-CM | POA: Diagnosis not present

## 2014-09-24 DIAGNOSIS — M8448XA Pathological fracture, other site, initial encounter for fracture: Secondary | ICD-10-CM | POA: Diagnosis present

## 2014-09-24 DIAGNOSIS — Z79891 Long term (current) use of opiate analgesic: Secondary | ICD-10-CM | POA: Diagnosis not present

## 2014-09-24 DIAGNOSIS — Z79899 Other long term (current) drug therapy: Secondary | ICD-10-CM | POA: Diagnosis not present

## 2014-09-24 DIAGNOSIS — I129 Hypertensive chronic kidney disease with stage 1 through stage 4 chronic kidney disease, or unspecified chronic kidney disease: Secondary | ICD-10-CM | POA: Insufficient documentation

## 2014-09-24 LAB — CBC
HCT: 31 % — ABNORMAL LOW (ref 36.0–46.0)
HEMOGLOBIN: 10.2 g/dL — AB (ref 12.0–15.0)
MCH: 33.1 pg (ref 26.0–34.0)
MCHC: 32.9 g/dL (ref 30.0–36.0)
MCV: 100.6 fL — AB (ref 78.0–100.0)
Platelets: 126 10*3/uL — ABNORMAL LOW (ref 150–400)
RBC: 3.08 MIL/uL — ABNORMAL LOW (ref 3.87–5.11)
RDW: 13.5 % (ref 11.5–15.5)
WBC: 3.1 10*3/uL — ABNORMAL LOW (ref 4.0–10.5)

## 2014-09-24 LAB — BASIC METABOLIC PANEL
Anion gap: 11 (ref 5–15)
BUN: 17 mg/dL (ref 6–23)
CHLORIDE: 105 meq/L (ref 96–112)
CO2: 27 mEq/L (ref 19–32)
CREATININE: 1.22 mg/dL — AB (ref 0.50–1.10)
Calcium: 8.6 mg/dL (ref 8.4–10.5)
GFR calc Af Amer: 54 mL/min — ABNORMAL LOW (ref >90)
GFR calc non Af Amer: 47 mL/min — ABNORMAL LOW (ref >90)
GLUCOSE: 80 mg/dL (ref 70–99)
POTASSIUM: 3.8 meq/L (ref 3.7–5.3)
Sodium: 143 mEq/L (ref 137–147)

## 2014-09-24 LAB — PROTIME-INR
INR: 1.02 (ref 0.00–1.49)
Prothrombin Time: 13.5 seconds (ref 11.6–15.2)

## 2014-09-24 LAB — APTT: aPTT: 25 seconds (ref 24–37)

## 2014-09-24 MED ORDER — TOBRAMYCIN SULFATE 1.2 G IJ SOLR
INTRAMUSCULAR | Status: AC
  Filled 2014-09-24: qty 1.2

## 2014-09-24 MED ORDER — VANCOMYCIN HCL IN DEXTROSE 1-5 GM/200ML-% IV SOLN
1000.0000 mg | INTRAVENOUS | Status: AC
  Administered 2014-09-24: 1000 mg via INTRAVENOUS
  Filled 2014-09-24: qty 200

## 2014-09-24 MED ORDER — BUPIVACAINE HCL (PF) 0.25 % IJ SOLN
INTRAMUSCULAR | Status: AC
  Filled 2014-09-24: qty 30

## 2014-09-24 MED ORDER — MIDAZOLAM HCL 2 MG/2ML IJ SOLN
INTRAMUSCULAR | Status: AC | PRN
  Administered 2014-09-24: 1 mg via INTRAVENOUS

## 2014-09-24 MED ORDER — BUPIVACAINE HCL (PF) 0.25 % IJ SOLN
INTRAMUSCULAR | Status: AC
  Filled 2014-09-24: qty 60

## 2014-09-24 MED ORDER — MIDAZOLAM HCL 2 MG/2ML IJ SOLN
INTRAMUSCULAR | Status: AC
  Filled 2014-09-24: qty 6

## 2014-09-24 MED ORDER — SODIUM CHLORIDE 0.9 % IV SOLN: INTRAVENOUS | Status: AC

## 2014-09-24 MED ORDER — FENTANYL CITRATE 0.05 MG/ML IJ SOLN
INTRAMUSCULAR | Status: AC | PRN
  Administered 2014-09-24: 25 ug via INTRAVENOUS

## 2014-09-24 MED ORDER — SODIUM CHLORIDE 0.9 % IV SOLN: INTRAVENOUS | Status: DC

## 2014-09-24 MED ORDER — FENTANYL CITRATE 0.05 MG/ML IJ SOLN
INTRAMUSCULAR | Status: AC
  Filled 2014-09-24: qty 4

## 2014-09-24 MED ORDER — HYDROMORPHONE HCL 1 MG/ML IJ SOLN
INTRAMUSCULAR | Status: DC
Start: 2014-09-24 — End: 2014-09-24
  Filled 2014-09-24: qty 3

## 2014-09-24 NOTE — Sedation Documentation (Signed)
Pt shook head no with eyes closed when asked if in any pain.

## 2014-09-24 NOTE — Sedation Documentation (Signed)
Transport arrived to take pt and friend to [facility].

## 2014-09-24 NOTE — Sedation Documentation (Signed)
Dr. Estanislado Pandy in to s/w pt and friend. Questions answered.

## 2014-09-24 NOTE — Progress Notes (Signed)
Ice pack to back 

## 2014-09-24 NOTE — Procedures (Signed)
S/P T12 RF ablation folloed by vert body augmentation

## 2014-09-24 NOTE — Sedation Documentation (Signed)
Pt requested her friend by present for after procedure discussion. Dr. Estanislado Pandy in speaking with pt and friend, Manus Gunning,  regarding outcome of procedure and answering questions.

## 2014-09-24 NOTE — Sedation Documentation (Signed)
Patient is resting comfortably. 

## 2014-09-24 NOTE — Sedation Documentation (Signed)
Pt denied pain.

## 2014-09-24 NOTE — Sedation Documentation (Signed)
Patient is resting comfortably.  No moaning, grimacing noted.  Eyes closed

## 2014-09-24 NOTE — H&P (Signed)
Chief Complaint: Back pain Hx Multiple Myeloma  Referring Physician(s): Deveshwar,Sanjeev K  History of Present Illness: Marissa Parks is a 63 y.o. female  Dx MM 06/2014 Developed back pain several weeks ago No injury MRI reveals Thoracic 12 pathologic fx and many areas of tumor in spine Consulted with Dr Estanislado Pandy Now scheduled for T 12 tumor ablation and augmentation with biopsy   Past Medical History  Diagnosis Date  . Hypertension   . Chronic kidney disease   . Hyperlipidemia   . Back pain   . Fracture of vertebra   . Bone pain 07/10/2014  . Nausea alone 07/13/2014  . Dysuria   . Edema 09/11/2014    Past Surgical History  Procedure Laterality Date  . Lipoma excision    . Ganglion cyst excision    . Laparoscopic ovarian cystectomy    . Ablation on endometriosis    . Colonoscopy w/ polypectomy      Allergies: Ace inhibitors; Hydrocodone; and Cephalexin  Medications: Prior to Admission medications   Medication Sig Start Date End Date Taking? Authorizing Provider  acyclovir (ZOVIRAX) 400 MG tablet Take 1 tablet (400 mg total) by mouth daily. 07/10/14   Heath Lark, MD  amLODipine (NORVASC) 10 MG tablet Take 1 tablet (10 mg total) by mouth daily. 07/20/14   Heath Lark, MD  aspirin 81 MG tablet Take 81 mg by mouth daily.    Historical Provider, MD  atorvastatin (LIPITOR) 10 MG tablet Take 10 mg by mouth every morning.  03/20/14   Historical Provider, MD  Calcium-Phosphorus-Vitamin D (CITRACAL +D3 PO) Take by mouth. 500 u-$RemoveBefo'400mg'iAuwGRoWTeT$     Historical Provider, MD  cholecalciferol (VITAMIN D) 1000 UNITS tablet Take 2,000 Units by mouth every morning.     Historical Provider, MD  ciprofloxacin (CIPRO) 250 MG tablet Take 1 tablet (250 mg total) by mouth 2 (two) times daily. 09/11/14   Heath Lark, MD  dexamethasone (DECADRON) 4 MG tablet TAKE 10 TABLETS TWICE A WEEK ON THE DAYS OF CHEMO 09/07/14   Heath Lark, MD  furosemide (LASIX) 20 MG tablet Take 1 tablet (20 mg total) by mouth  daily. 09/11/14   Heath Lark, MD  HYDROmorphone (DILAUDID) 4 MG tablet Take 1 tablet (4 mg total) by mouth every 4 (four) hours as needed for severe pain. 09/17/14   Heath Lark, MD  irbesartan-hydrochlorothiazide (AVALIDE) 300-12.5 MG per tablet  03/20/14   Historical Provider, MD  lenalidomide (REVLIMID) 10 MG capsule TAKE ONE CAPSULE DAILY X14 DAYS, THEN 7 DAYS OFF 09/11/14   Heath Lark, MD  morphine (MS CONTIN) 30 MG 12 hr tablet Take 1 tablet (30 mg total) by mouth every 12 (twelve) hours. 08/21/14   Heath Lark, MD  omeprazole (PRILOSEC) 20 MG capsule Take 20 mg by mouth every morning.  02/18/14   Historical Provider, MD  polyvinyl alcohol (LIQUIFILM TEARS) 1.4 % ophthalmic solution Place 1-2 drops into both eyes 2 (two) times daily as needed for dry eyes.     Historical Provider, MD  promethazine (PHENERGAN) 25 MG tablet Take 1 tablet (25 mg total) by mouth every 6 (six) hours as needed for nausea or vomiting. 07/13/14   Heath Lark, MD  senna-docusate (SENOKOT-S) 8.6-50 MG per tablet Take 2 tablets by mouth 2 (two) times daily. 07/10/14   Heath Lark, MD    Family History  Problem Relation Age of Onset  . Cancer Maternal Aunt     breast and throat ca  . Cancer Paternal Aunt     ?  ovarian or uterine ca  . Cancer Paternal Uncle     "early stage of leukemia"  . Cancer Paternal Grandmother     lung ca  . Cancer Paternal Grandfather     lung ca  . Cancer Paternal Uncle     colon ca, throat ca, skin ca  . Cancer Cousin     rare intestinal ca    History   Social History  . Marital Status: Single    Spouse Name: N/A    Number of Children: N/A  . Years of Education: N/A   Social History Main Topics  . Smoking status: Never Smoker   . Smokeless tobacco: Never Used  . Alcohol Use: No  . Drug Use: None  . Sexual Activity: No   Other Topics Concern  . None   Social History Narrative  . None    Review of Systems: A 12 point ROS discussed and pertinent positives are indicated in the  HPI above.  All other systems are negative.  Review of Systems  Constitutional: Positive for activity change and fatigue.  Respiratory: Negative for cough and shortness of breath.   Cardiovascular: Negative for chest pain.  Gastrointestinal: Negative for nausea, vomiting and abdominal pain.  Genitourinary: Negative for difficulty urinating.  Musculoskeletal: Positive for back pain.  Skin: Negative for color change.  Neurological: Positive for weakness.  Psychiatric/Behavioral: Negative for behavioral problems and confusion.    Vital Signs: BP 144/79  Pulse 72  Temp(Src) 98.1 F (36.7 C) (Oral)  Resp 20  Ht 5' (1.524 m)  Wt 46.72 kg (103 lb)  BMI 20.12 kg/m2  SpO2 100%  Physical Exam  Constitutional: She is oriented to person, place, and time. She appears well-nourished.  Cardiovascular: Normal rate, regular rhythm and normal heart sounds.   No murmur heard. Pulmonary/Chest: Effort normal and breath sounds normal. She has no wheezes.  Abdominal: Soft. Bowel sounds are normal. There is no tenderness.  Musculoskeletal: Normal range of motion.  Mid back pain  Neurological: She is alert and oriented to person, place, and time.  Skin: Skin is warm and dry.  Psychiatric: She has a normal mood and affect. Her behavior is normal. Judgment and thought content normal.    Imaging: Ir Radiologist Eval & Mgmt  09/04/2014   EXAM: NEW PATIENT OFFICE VISIT  CHIEF COMPLAINT: Severe thoracolumbar pain secondary to pathologic compression fracture at T12.  Current Pain Level: 1-10  HISTORY OF PRESENT ILLNESS: The patient is a 63 year old who has been referred for evaluation for pain control due to a pathologic compression fracture at T12.  The fracture was diagnosed in July of this year following workup for low back pain and also for a period of time the patient was diagnosed with a UTI with subsequent analysis revealing high protein urine. Further testing including a bone marrow sample revealed  the diagnosis of multiple myeloma.  Patient has had a least 3 cycles of chemotherapy for her multiple myeloma.  Her back pain at the present time is believed to be less intense mostly on account of the patient being on pain medications.  However this pain would be significant without her pain medications.  The patient reports the pain mostly when she stoops over or stands for prolonged periods of time.  She denies any radiation of this pain into her lower extremities or circumferentially.  Overall she reports improved generalized energy after having received her cycles of chemotherapy.  She reports weight loss since her diagnosis. Her appetite however  remains normal, and she eats 3 meals regularly.  Denies any of autonomic dysfunction of her bowel or bladder. She has just undergone treatment for a UTI with 7 days of Cipro.  At the present time she denies any nausea, vomiting, fever, chills or rigors.  She denies any dysuria, or frequency of micturition or hematuria.  She ambulates with a cane mostly to maintain her balance.  Past Medical History: Cancer of multiple myeloma as above. High blood pressure. High cholesterol. Reflux indigestion.  Previous surgeries: Removal of a cyst from the right wrist. Removal of tumor left elbow. Removal of ovarian cysts. Removal of a precancerous colon polyp.  Medications: Acyclovir. Amlodipine. Baby aspirin, 1 per day. Atorvastatin. Cholecalciferol. Dexamethasone. Hydromorphone. Avalide. Revlimid. Morphine. Omeprazole. Polyvinyl alcohol ophthalmic solution. Promethazine. Senokot-S.  Allergies: Ace inhibitors cause cough. Hydrocodone causes rash. Cephalexin causes rash.  Social History: The patient is single. Has no children. Two sisters and one brother alive and well. Is employed as a Engineer, maintenance ([employer]). Denies alcohol intake. Denies smoking. Denies use of illicit chemicals.  Family History: History of cancer in the family. These range from grandparents, aunts, uncles and cousins with lung  cancer, colon cancer, intestinal cancer, breast carcinoma, throat cancer, skin carcinoma and prostate carcinoma. Mother has Parkinson's disease. Headaches. Heart problems. Kidney disease. Lung disease. Migraine headaches.  REVIEW OF SYSTEMS: In Addition to the above, patient complains of a dry mouth due to the numerous medications she is on. Increasing constipation on account of the pain medications. Essentially negative for pathologic symptomatology.  PHYSICAL EXAMINATION: Brief examination reveals patient to be in mild distress on account of her pain. Otherwise affect within normal limits. Cooperative. Patient is awake, alert, oriented to time, place, space. No lateralizing cranial nerve, motor or sensory or gait abnormalities.  Tenderness of the thoracolumbar region at the site of kyphosis.  ASSESSMENT AND PLAN: The patient's recent MRI scan of the thoracic and lumbar spines were reviewed with the patient and the patient's friend. These reveal multilevel involvement of the vertebral bodies with myeloma.  However compression deformity is seen at T12 with mild retropulsion. Also noted are signal characteristics suggestive of subacute fracture at this site.  The option of vertebral body augmentation at T12 proceeded by radiofrequency ablation to eliminate as much of myeloma cells at T12 prior to augmentation was discussed in detail. The procedure, the risks, the benefits and the alternatives were also reviewed. The reason for the procedure is to provide pain relief and potentially decrease and possibly elminate the need for pain medications. Also to prevent further collapse at T12 with worsening in retropulsion.  The patient would like to proceed with the radiofrequency ablation followed by vertebral body augmentation. She would like to schedule this for mid October. In the meantime, patient has been advised to continue refraining from heavy lifting, stooping or bending or prolonged driving.  The procedure will be  scheduled as per patient wishes sometime just after mid October.   Electronically Signed   By: Luanne Bras M.D.   On: 09/03/2014 15:02    Labs:  CBC:  Recent Labs  08/21/14 1135 08/28/14 1103 09/11/14 1237 09/18/14 1345  WBC 7.1 5.6 5.3 4.4  HGB 9.6* 9.7* 9.4* 10.0*  HCT 29.2* 29.8* 28.9* 30.6*  PLT 260 213 255 213    COAGS: No results found for this basename: INR, APTT,  in the last 8760 hours  BMP:  Recent Labs  07/08/14 1205 07/09/14 0521 07/10/14 0445 07/13/14 1222  08/21/14 1135 08/28/14 1103  09/11/14 1237 09/18/14 1345  NA 138 139 139 136  < > 139 140 139 143  K 3.5* 4.1 3.8 3.3*  < > 4.3 3.5 3.8 3.9  CL 97 103 104 100  --   --   --   --   --   CO2 $Re'24 23 21 24  'lgE$ < > $R'25 25 23 26  'GY$ GLUCOSE 93 124* 92 91  < > 135 95 191* 118  BUN 33* 39* 42* 23  < > 16.7 18.0 18.2 16.3  CALCIUM 13.1* 11.2* 10.3 10.4  < > 9.7 9.1 9.0 9.2  CREATININE 2.83* 2.78* 2.48* 2.05*  < > 1.2* 1.1 1.2* 1.3*  GFRNONAA 17* 17* 20*  --   --   --   --   --   --   GFRAA 20* 20* 23*  --   --   --   --   --   --   < > = values in this interval not displayed.  LIVER FUNCTION TESTS:  Recent Labs  08/21/14 1135 08/28/14 1103 09/11/14 1237 09/18/14 1345  BILITOT 0.24 0.25 0.28 0.28  AST $Re'23 17 20 16  'XRr$ ALT 25 38 41 25  ALKPHOS 173* 159* 186* 158*  PROT 7.2 7.0 6.7 6.2*  ALBUMIN 3.8 3.7 3.6 3.5    TUMOR MARKERS: No results found for this basename: AFPTM, CEA, CA199, CHROMGRNA,  in the last 8760 hours  Assessment and Plan:  Hx MM New back pain MRI reveals T12 pathologic fx amongst multiple areas of tumor in spine Now scheduled for T12 tumor ablation with augmentation Pt aware of procedure benefits and risks and agreeable to proceed Consent signed andin chart  Thank you for this interesting consult.  I greatly enjoyed meeting Marissa Parks and look forward to participating in their care.   I spent a total of 20 minutes face to face in clinical consultation, greater than 50% of  which was counseling/coordinating care for T12 tumor ablation and augmentation  Signed: Khalessi Blough A 09/24/2014, 8:00 AM

## 2014-09-24 NOTE — Sedation Documentation (Signed)
Pt denied pain when asked.

## 2014-09-24 NOTE — Discharge Instructions (Signed)
1. No stooping,bending or lifting more than 10 lbs for 2 weeks. °2.Use walker to ambulate for 2 weeks . °3.RTC in 2 weeks °KYPHOPLASTY/VERTEBROPLASTY DISCHARGE INSTRUCTIONS ° °Medications: (check all that apply) ° °   Resume all home medications as before procedure. °   °           °  °Continue your pain medications as prescribed as needed.  Over the next 3-5 days, decrease your pain medication as tolerated.  Over the counter medications (i.e. Tylenol, ibuprofen, and aleve) may be substituted once severe/moderate pain symptoms have subsided. ° ° Wound Care: °- Bandages may be removed the day following your procedure.  You may get your incision wet once bandages are removed.  Bandaids may be used to cover the incisions until scab formation.  Topical ointments are optional. ° °- If you develop a fever greater than 101 degrees, have increased skin redness at the incision sites or pus-like oozing from incisions occurring within 1 week of the procedure, contact radiology at [phone] or [phone]. ° °- Ice pack to back for 15-20 minutes 2-3 time per day for first 2-3 days post procedure.  The ice will expedite muscle healing and help with the pain from the incisions. ° ° Activity: °- Bedrest today with limited activity for 24 hours post procedure. ° °- No driving for 48 hours. ° °- Increase your activity as tolerated after bedrest (with assistance if necessary). ° °- Refrain from any strenuous activity or heavy lifting (greater than 10 lbs.). ° ° Follow up: °- Contact radiology at [phone] or [phone] if any questions/concerns. ° °- A physician assistant from radiology will contact you in approximately 1 week. ° °- If a biopsy was performed at the time of your procedure, your referring physician should receive the results in usually 2-3 days. ° ° ° ° ° ° ° ° °

## 2014-09-24 NOTE — Sedation Documentation (Signed)
Pt transferred to stretcher to go to room P3 in short stay.

## 2014-09-30 ENCOUNTER — Encounter (HOSPITAL_COMMUNITY): Payer: Self-pay

## 2014-09-30 ENCOUNTER — Ambulatory Visit (HOSPITAL_COMMUNITY)
Admission: RE | Admit: 2014-09-30 | Discharge: 2014-09-30 | Disposition: A | Discharge status: Home / Self Care | Payer: BC Managed Care – PPO | Source: Ambulatory Visit | Attending: Hematology and Oncology | Admitting: Hematology and Oncology

## 2014-09-30 ENCOUNTER — Other Ambulatory Visit: Payer: Self-pay | Admitting: *Deleted

## 2014-09-30 VITALS — BP 139/71 | HR 95 | Temp 98.0°F | Resp 18 | Ht 60.0 in | Wt 103.0 lb

## 2014-09-30 DIAGNOSIS — C9000 Multiple myeloma not having achieved remission: Secondary | ICD-10-CM | POA: Diagnosis present

## 2014-09-30 DIAGNOSIS — C9001 Multiple myeloma in remission: Secondary | ICD-10-CM | POA: Insufficient documentation

## 2014-09-30 DIAGNOSIS — D649 Anemia, unspecified: Secondary | ICD-10-CM | POA: Diagnosis not present

## 2014-09-30 LAB — CBC WITH DIFFERENTIAL/PLATELET
Basophils Absolute: 0.1 10*3/uL (ref 0.0–0.1)
Basophils Relative: 1 % (ref 0–1)
EOS PCT: 5 % (ref 0–5)
Eosinophils Absolute: 0.3 10*3/uL (ref 0.0–0.7)
HEMATOCRIT: 33.9 % — AB (ref 36.0–46.0)
Hemoglobin: 10.9 g/dL — ABNORMAL LOW (ref 12.0–15.0)
LYMPHS ABS: 0.9 10*3/uL (ref 0.7–4.0)
LYMPHS PCT: 15 % (ref 12–46)
MCH: 31.9 pg (ref 26.0–34.0)
MCHC: 32.2 g/dL (ref 30.0–36.0)
MCV: 99.1 fL (ref 78.0–100.0)
MONO ABS: 0.7 10*3/uL (ref 0.1–1.0)
Monocytes Relative: 12 % (ref 3–12)
NEUTROS ABS: 4.1 10*3/uL (ref 1.7–7.7)
Neutrophils Relative %: 67 % (ref 43–77)
Platelets: 178 10*3/uL (ref 150–400)
RBC: 3.42 MIL/uL — AB (ref 3.87–5.11)
RDW: 13.4 % (ref 11.5–15.5)
WBC: 6.1 10*3/uL (ref 4.0–10.5)

## 2014-09-30 LAB — BONE MARROW EXAM

## 2014-09-30 MED ORDER — MIDAZOLAM HCL 10 MG/2ML IJ SOLN: 10.0000 mg | Freq: Once | INTRAMUSCULAR | Status: DC

## 2014-09-30 MED ORDER — SODIUM CHLORIDE 0.9 % IV SOLN
INTRAVENOUS | Status: DC
  Administered 2014-09-30: 07:00:00 via INTRAVENOUS

## 2014-09-30 MED ORDER — MIDAZOLAM HCL 10 MG/2ML IJ SOLN
10.0000 mg | Freq: Once | INTRAMUSCULAR | Status: DC
  Filled 2014-09-30: qty 2

## 2014-09-30 MED ORDER — FENTANYL CITRATE 0.05 MG/ML IJ SOLN
100.0000 ug | Freq: Once | INTRAMUSCULAR | Status: DC
  Filled 2014-09-30: qty 2

## 2014-09-30 MED ORDER — FENTANYL CITRATE 0.05 MG/ML IJ SOLN
INTRAMUSCULAR | Status: AC | PRN
  Administered 2014-09-30: 25 ug via INTRAVENOUS
  Administered 2014-09-30: 50 ug via INTRAVENOUS

## 2014-09-30 MED ORDER — FENTANYL CITRATE 0.05 MG/ML IJ SOLN: 100.0000 ug | Freq: Once | INTRAMUSCULAR | Status: DC

## 2014-09-30 MED ORDER — MIDAZOLAM HCL 2 MG/2ML IJ SOLN
INTRAMUSCULAR | Status: AC | PRN
  Administered 2014-09-30: 5 mg via INTRAVENOUS

## 2014-09-30 NOTE — Discharge Instructions (Signed)

## 2014-09-30 NOTE — Sedation Documentation (Signed)
Patient is resting comfortably. 

## 2014-09-30 NOTE — Procedures (Signed)
Brief examination was performed. ENT: adequate airway clearance Heart: regular rate and rhythm.No Murmurs Lungs: clear to auscultation, no wheezes, normal respiratory effort  American Society of Anesthesiologists ASA scale 2  Mallampati Score of 2  Bone Marrow Biopsy and Aspiration Procedure Note   Informed consent was obtained and potential risks including bleeding, infection and pain were reviewed with the patient. I verified that the patient has been fasting since midnight.  The patient's name, date of birth, identification, consent and allergies were verified prior to the start of procedure and time out was performed.  A total of 5 mg of IV Versed and 75 mcg of IV fentanyl were given.  The right posterior iliac crest was chosen as the site of biopsy.  The skin was prepped with Betadine solution.   8 cc of 1% lidocaine was used to provide local anaesthesia.   10 cc of bone marrow aspirate was obtained followed by 1 inch biopsy.   The procedure was tolerated well and there were no complications.  The patient was stable at the end of the procedure.  Specimens sent for flow cytometry, cytogenetics and additional studies.

## 2014-09-30 NOTE — Telephone Encounter (Signed)
Per Dr. Alvy Bimler: Hold off on Revlimid refill. May go to transplant. Faxed script back to [facility] with this message at [phone].

## 2014-10-02 ENCOUNTER — Other Ambulatory Visit (HOSPITAL_COMMUNITY): Payer: BC Managed Care – PPO

## 2014-10-03 ENCOUNTER — Other Ambulatory Visit: Payer: Self-pay | Admitting: Hematology and Oncology

## 2014-10-05 ENCOUNTER — Telehealth: Payer: Self-pay | Admitting: *Deleted

## 2014-10-05 ENCOUNTER — Telehealth: Payer: Self-pay | Admitting: Hematology and Oncology

## 2014-10-05 NOTE — Telephone Encounter (Signed)
added pt appt..per staff pt aware

## 2014-10-05 NOTE — Telephone Encounter (Signed)
Yes, please add lab appt before she sees me

## 2014-10-05 NOTE — Telephone Encounter (Signed)
If she can come in today or tomorrow then we have labs results to look at on Friday

## 2014-10-05 NOTE — Telephone Encounter (Signed)
Pt states would like to come tomorrow morning at 8:15 am for lab.  Request sent to scheduler.

## 2014-10-05 NOTE — Telephone Encounter (Signed)
Pt asks if any other labs she needs to have done prior to her appt w/ Dr. Alvy Bimler this Friday?

## 2014-10-06 ENCOUNTER — Other Ambulatory Visit (HOSPITAL_BASED_OUTPATIENT_CLINIC_OR_DEPARTMENT_OTHER): Payer: PRIVATE HEALTH INSURANCE

## 2014-10-06 DIAGNOSIS — C9000 Multiple myeloma not having achieved remission: Secondary | ICD-10-CM

## 2014-10-06 LAB — UPEP/TP, 24-HR URINE
ALPHA-1-GLOBULIN, U: 17.2 %
ALPHA-2-GLOBULIN, U: 8 %
Albumin: 3.3 %
BETA GLOBULIN, U: 69.3 %
Collection Interval: 24 hours
GAMMA GLOBULIN, U: 2.2 %
TOTAL PROTEIN, URINE: 82 mg/dL
Total Protein, Urine/Day: 738 mg/d — ABNORMAL HIGH (ref 50–100)
Total Volume, Urine: 900 mL

## 2014-10-06 LAB — COMPREHENSIVE METABOLIC PANEL (CC13)
ALT: 23 U/L (ref 0–55)
ANION GAP: 10 meq/L (ref 3–11)
AST: 12 U/L (ref 5–34)
Albumin: 3.8 g/dL (ref 3.5–5.0)
Alkaline Phosphatase: 112 U/L (ref 40–150)
BILIRUBIN TOTAL: 0.26 mg/dL (ref 0.20–1.20)
BUN: 21.2 mg/dL (ref 7.0–26.0)
CALCIUM: 9.5 mg/dL (ref 8.4–10.4)
CHLORIDE: 106 meq/L (ref 98–109)
CO2: 26 meq/L (ref 22–29)
Creatinine: 1.2 mg/dL — ABNORMAL HIGH (ref 0.6–1.1)
GLUCOSE: 88 mg/dL (ref 70–140)
Potassium: 3.3 mEq/L — ABNORMAL LOW (ref 3.5–5.1)
Sodium: 143 mEq/L (ref 136–145)
Total Protein: 6.4 g/dL (ref 6.4–8.3)

## 2014-10-06 LAB — CBC WITH DIFFERENTIAL/PLATELET
BASO%: 0.5 % (ref 0.0–2.0)
Basophils Absolute: 0 10*3/uL (ref 0.0–0.1)
EOS ABS: 0 10*3/uL (ref 0.0–0.5)
EOS%: 0.1 % (ref 0.0–7.0)
HEMATOCRIT: 32.8 % — AB (ref 34.8–46.6)
HGB: 10.8 g/dL — ABNORMAL LOW (ref 11.6–15.9)
LYMPH#: 0.8 10*3/uL — AB (ref 0.9–3.3)
LYMPH%: 12.3 % — AB (ref 14.0–49.7)
MCH: 32.2 pg (ref 25.1–34.0)
MCHC: 32.8 g/dL (ref 31.5–36.0)
MCV: 98 fL (ref 79.5–101.0)
MONO#: 0.7 10*3/uL (ref 0.1–0.9)
MONO%: 10.6 % (ref 0.0–14.0)
NEUT%: 76.5 % (ref 38.4–76.8)
NEUTROS ABS: 5.1 10*3/uL (ref 1.5–6.5)
PLATELETS: 270 10*3/uL (ref 145–400)
RBC: 3.34 10*6/uL — ABNORMAL LOW (ref 3.70–5.45)
RDW: 13.8 % (ref 11.2–14.5)
WBC: 6.7 10*3/uL (ref 3.9–10.3)

## 2014-10-07 LAB — UIFE/LIGHT CHAINS/TP QN, 24-HR UR
ALPHA 2 UR: DETECTED — AB
Albumin, U: DETECTED
Alpha 1, Urine: DETECTED — AB
Beta, Urine: DETECTED — AB
Gamma Globulin, Urine: DETECTED — AB
TIME-UPE24: 24 h
TOTAL PROTEIN, URINE-UR/DAY: 747 mg/d — AB (ref <150)
Total Protein, Urine: 83 mg/dL — ABNORMAL HIGH (ref 5–24)
Volume, Urine: 900 mL

## 2014-10-07 LAB — KAPPA/LAMBDA LIGHT CHAINS, FREE, WITH RATIO, 24HR. URINE
KAPPA Ligh Chain, Free U: 1960 mg/L — ABNORMAL HIGH (ref 1.35–24.19)
Kappa/Lambda, Free Ratio: 243.48 — ABNORMAL HIGH (ref 2.04–10.37)
Lambda Light Chain, Free U: 8.05 mg/L — ABNORMAL HIGH (ref 0.24–6.66)

## 2014-10-08 LAB — SPEP & IFE WITH QIG
ALPHA-1-GLOBULIN: 5.4 % — AB (ref 2.9–4.9)
Albumin ELP: 60.9 % (ref 55.8–66.1)
Alpha-2-Globulin: 14 % — ABNORMAL HIGH (ref 7.1–11.8)
BETA 2: 8.3 % — AB (ref 3.2–6.5)
Beta Globulin: 5.9 % (ref 4.7–7.2)
GAMMA GLOBULIN: 5.5 % — AB (ref 11.1–18.8)
IGG (IMMUNOGLOBIN G), SERUM: 382 mg/dL — AB (ref 690–1700)
IgA: 179 mg/dL (ref 69–380)
IgM, Serum: 63 mg/dL (ref 52–322)
M-SPIKE, %: 0.24 g/dL
Total Protein, Serum Electrophoresis: 6.3 g/dL (ref 6.0–8.3)

## 2014-10-08 LAB — KAPPA/LAMBDA LIGHT CHAINS
KAPPA FREE LGHT CHN: 47.5 mg/dL — AB (ref 0.33–1.94)
Kappa:Lambda Ratio: 44.39 — ABNORMAL HIGH (ref 0.26–1.65)
Lambda Free Lght Chn: 1.07 mg/dL (ref 0.57–2.63)

## 2014-10-08 LAB — BETA 2 MICROGLOBULIN, SERUM: BETA 2 MICROGLOBULIN: 3.51 mg/L — AB (ref <=2.51)

## 2014-10-08 LAB — CHROMOSOME ANALYSIS, BONE MARROW

## 2014-10-08 LAB — TISSUE HYBRIDIZATION (BONE MARROW)-NCBH

## 2014-10-09 ENCOUNTER — Other Ambulatory Visit (HOSPITAL_COMMUNITY): Payer: Self-pay | Admitting: Interventional Radiology

## 2014-10-09 ENCOUNTER — Telehealth: Payer: Self-pay | Admitting: Hematology and Oncology

## 2014-10-09 ENCOUNTER — Ambulatory Visit (HOSPITAL_BASED_OUTPATIENT_CLINIC_OR_DEPARTMENT_OTHER): Payer: PRIVATE HEALTH INSURANCE | Admitting: Hematology and Oncology

## 2014-10-09 ENCOUNTER — Other Ambulatory Visit: Payer: Self-pay | Admitting: *Deleted

## 2014-10-09 ENCOUNTER — Other Ambulatory Visit: Payer: Self-pay | Admitting: Hematology and Oncology

## 2014-10-09 ENCOUNTER — Encounter: Payer: Self-pay | Admitting: Hematology and Oncology

## 2014-10-09 ENCOUNTER — Telehealth: Payer: Self-pay | Admitting: *Deleted

## 2014-10-09 VITALS — BP 150/75 | HR 87 | Temp 98.4°F | Resp 18 | Ht 60.0 in | Wt 95.5 lb

## 2014-10-09 DIAGNOSIS — F32A Depression, unspecified: Secondary | ICD-10-CM | POA: Insufficient documentation

## 2014-10-09 DIAGNOSIS — F329 Major depressive disorder, single episode, unspecified: Secondary | ICD-10-CM

## 2014-10-09 DIAGNOSIS — C9000 Multiple myeloma not having achieved remission: Secondary | ICD-10-CM

## 2014-10-09 DIAGNOSIS — D630 Anemia in neoplastic disease: Secondary | ICD-10-CM

## 2014-10-09 DIAGNOSIS — M899 Disorder of bone, unspecified: Secondary | ICD-10-CM

## 2014-10-09 DIAGNOSIS — M898X9 Other specified disorders of bone, unspecified site: Secondary | ICD-10-CM

## 2014-10-09 MED ORDER — LENALIDOMIDE 10 MG PO CAPS: 10.0000 mg | ORAL_CAPSULE | Freq: Every day | ORAL | Status: DC

## 2014-10-09 MED ORDER — MORPHINE SULFATE ER 15 MG PO TBCR: 15.0000 mg | EXTENDED_RELEASE_TABLET | Freq: Every evening | ORAL | Status: DC

## 2014-10-09 NOTE — Telephone Encounter (Signed)
LVM for pt asking about status of Revlimid. Unsure whether she still has some at home or if she completed last cycle? Faxed new Rx for revlimid to [facility].  Dr. Alvy Bimler instructs for pt to restart next cycle of chemo next week.

## 2014-10-09 NOTE — Assessment & Plan Note (Signed)

## 2014-10-09 NOTE — Progress Notes (Signed)
[facility] OFF[facility] PROGRESS NOTE  Patient Care Team: Hulan Fess, MD as PCP - General (Family Medicine) Rob Hickman, MD as Consulting Physician (Interventional Radiology)  SUMMARY OF ONCOLOGIC HISTORY: Oncology History   Multiple myeloma without remission; Calcium 13.3, Creatinine 2.83, Hg 8.3, Bone lesions present, Beta 2 microglobulin 12.8, Albumin 3.5, IgA 1760 and kappa light chains 1670. Bone marrow biopsy showed 82% involvement.   Primary site: Multiple Myeloma   Staging method: AJCC 6th Edition   Clinical: Stage IIIB signed by Heath Lark, MD on 07/13/2014 12:38 PM   Summary: Stage IIIB       Multiple myeloma without remission   07/07/2014 - 07/10/2014 Hospital Admission She was admitted to the hospital for management of malignant hypercalcemia and had bone marrow biopsy which confirmed diagnosis of multiple myeloma. The patient received one unit of blood transfusion due to severe anemia.   07/08/2014 Bone Marrow Biopsy The patient had bone marrow biopsy that confirmed diagnosis.   07/08/2014 Pathology Results Accession: [id number] showed 82% involvement of bone marrow.   07/08/2014 -  Chemotherapy She was started on Velcade along with dexamethasone.   08/21/2014 -  Chemotherapy I added Revlimid and Zometa to her chemotherapy regimen.   09/24/2014 Pathology Results Accession: [id number] biopsy showed plasma cells involvement.   09/24/2014 Procedure she has kyphoplasty performed.   09/30/2014 Bone Marrow Biopsy Accession: [id number] bone marrow biopsy showed residual plasma cell, approximately 11%    INTERVAL HISTORY: Please see below for problem oriented charting. She returns for follow-up on test results. She complain of some depression and anxiety since she came off treatment. She denies suicidal ideation. Her pain is better controlled since recent kyphoplasty and she is anxious to wean herself off medications. She denies any nausea or vomiting. No peripheral  neuropathy from recent chemotherapy.  REVIEW OF SYSTEMS:   Constitutional: Denies fevers, chills or abnormal weight loss Eyes: Denies blurriness of vision Ears, nose, mouth, throat, and face: Denies mucositis or sore throat Respiratory: Denies cough, dyspnea or wheezes Cardiovascular: Denies palpitation, chest discomfort or lower extremity swelling Gastrointestinal:  Denies nausea, heartburn or change in bowel habits Skin: Denies abnormal skin rashes Lymphatics: Denies new lymphadenopathy or easy bruising Neurological:Denies numbness, tingling or new weaknesses Behavioral/Psych: Mood is stable, no new changes  All other systems were reviewed with the patient and are negative.  I have reviewed the past medical history, past surgical history, social history and family history with the patient and they are unchanged from previous note.  ALLERGIES:  is allergic to hydrocodone; ace inhibitors; and cephalexin.  MEDICATIONS:  Current Outpatient Prescriptions  Medication Sig Dispense Refill  . acyclovir (ZOVIRAX) 400 MG tablet Take 1 tablet (400 mg total) by mouth daily. 30 tablet 6  . amLODipine (NORVASC) 10 MG tablet TAKE 1 TABLET (10 MG TOTAL) BY MOUTH DAILY. 30 tablet 0  . aspirin 81 MG tablet Take 81 mg by mouth daily.    Marland Kitchen atorvastatin (LIPITOR) 10 MG tablet Take 10 mg by mouth every morning.     . Calcium-Phosphorus-Vitamin D (CITRACAL +D3 PO) Take 1 tablet by mouth daily. 500 u-$RemoveBefo'400mg'imNlHopWCmh$     . cholecalciferol (VITAMIN D) 1000 UNITS tablet Take 2,000 Units by mouth every morning.     Marland Kitchen dexamethasone (DECADRON) 4 MG tablet Take 20 mg by mouth once a week. On Monday.    . furosemide (LASIX) 20 MG tablet Take 1 tablet (20 mg total) by mouth daily. 30 tablet 3  . HYDROmorphone (DILAUDID) 2  MG tablet Take 2 mg by mouth every 4 (four) hours as needed for severe pain.    Marland Kitchen lenalidomide (REVLIMID) 10 MG capsule Take 10 mg by mouth daily. For 14 days    . morphine (MS CONTIN) 15 MG 12 hr tablet Take  1 tablet (15 mg total) by mouth every evening. 30 tablet 0  . omeprazole (PRILOSEC) 20 MG capsule Take 20 mg by mouth every morning.     . polyvinyl alcohol (LIQUIFILM TEARS) 1.4 % ophthalmic solution Place 1 drop into both eyes as needed for dry eyes.     . promethazine (PHENERGAN) 25 MG tablet Take 1 tablet (25 mg total) by mouth every 6 (six) hours as needed for nausea or vomiting. 60 tablet 3  . senna-docusate (SENOKOT-S) 8.6-50 MG per tablet Take 2 tablets by mouth 2 (two) times daily. 60 tablet 3   No current facility-administered medications for this visit.    PHYSICAL EXAMINATION: ECOG PERFORMANCE STATUS: 1 - Symptomatic but completely ambulatory  Filed Vitals:   10/09/14 0929  BP: 150/75  Pulse: 87  Temp: 98.4 F (36.9 C)  Resp: 18   Filed Weights   10/09/14 0929  Weight: 95 lb 8 oz (43.319 kg)    GENERAL:alert, no distress and comfortable. She looks thin, cachectic and appeared anxious. SKIN: skin color, texture, turgor are normal, no rashes or significant lesions EYES: normal, Conjunctiva are pink and non-injected, sclera clear Musculoskeletal:no cyanosis of digits and no clubbing  NEURO: alert & oriented x 3 with fluent speech, no focal motor/sensory deficits  LABORATORY DATA:  I have reviewed the data as listed    Component Value Date/Time   NA 143 10/06/2014 0830   NA 143 09/24/2014 0722   K 3.3* 10/06/2014 0830   K 3.8 09/24/2014 0722   CL 105 09/24/2014 0722   CO2 26 10/06/2014 0830   CO2 27 09/24/2014 0722   GLUCOSE 88 10/06/2014 0830   GLUCOSE 80 09/24/2014 0722   BUN 21.2 10/06/2014 0830   BUN 17 09/24/2014 0722   CREATININE 1.2* 10/06/2014 0830   CREATININE 1.22* 09/24/2014 0722   CALCIUM 9.5 10/06/2014 0830   CALCIUM 8.6 09/24/2014 0722   PROT 6.4 10/06/2014 0830   PROT 7.2 07/13/2014 1222   ALBUMIN 3.8 10/06/2014 0830   ALBUMIN 3.9 07/13/2014 1222   AST 12 10/06/2014 0830   AST 22 07/13/2014 1222   ALT 23 10/06/2014 0830   ALT 75*  07/13/2014 1222   ALKPHOS 112 10/06/2014 0830   ALKPHOS 90 07/13/2014 1222   BILITOT 0.26 10/06/2014 0830   BILITOT 0.4 07/13/2014 1222   GFRNONAA 47* 09/24/2014 0722   GFRAA 54* 09/24/2014 0722    No results found for: SPEP, UPEP  Lab Results  Component Value Date   WBC 6.7 10/06/2014   NEUTROABS 5.1 10/06/2014   HGB 10.8* 10/06/2014   HCT 32.8* 10/06/2014   MCV 98.0 10/06/2014   PLT 270 10/06/2014      Chemistry      Component Value Date/Time   NA 143 10/06/2014 0830   NA 143 09/24/2014 0722   K 3.3* 10/06/2014 0830   K 3.8 09/24/2014 0722   CL 105 09/24/2014 0722   CO2 26 10/06/2014 0830   CO2 27 09/24/2014 0722   BUN 21.2 10/06/2014 0830   BUN 17 09/24/2014 0722   CREATININE 1.2* 10/06/2014 0830   CREATININE 1.22* 09/24/2014 0722      Component Value Date/Time   CALCIUM 9.5 10/06/2014 0830  CALCIUM 8.6 09/24/2014 0722   ALKPHOS 112 10/06/2014 0830   ALKPHOS 90 07/13/2014 1222   AST 12 10/06/2014 0830   AST 22 07/13/2014 1222   ALT 23 10/06/2014 0830   ALT 75* 07/13/2014 1222   BILITOT 0.26 10/06/2014 0830   BILITOT 0.4 07/13/2014 1222      ASSESSMENT & PLAN:  Multiple myeloma without remission Overall, she tolerated her treatment well. The patient is interested to pursue bone marrow transplant. However, she has a lot of psychosocial issues I recommend deferring bone marrow transplant until a few more months next year so that she can have more time to cope. I recommend we continue at least 3 more cycles of chemotherapy using Velcade, Revlimid and dexamethasone along with Zometa.     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 The patient denies recent history of bleeding such as epistaxis, hematuria or hematochezia. She is asymptomatic from the anemia. I will observe for now.  She does not require transfusion now. I will continue the chemotherapy at current dose without dosage adjustment.  If the anemia gets progressive worse in  the future, I might have to delay her treatment or adjust the chemotherapy dose.   Bone pain Her bone pain has improved with the use of long-acting morphine sulfate and recent kyphoplasty. I recommend reducing MS Contin to 15 mg daily and reduced Dilaudid to 2 mg every 4 hours as needed.     Depression She appears overwhelmed, anxious, and depressed because she cannot work full-time and is dependent on family and friends to drive her in. I gave her permission to drive again. I reassured her that her emotional response to everything that happens is common. I recommend she reduce her work hours to 20 hours per week. I do not recommend starting her on an antidepressant for now since we are making a lot of medication adjustment. She will continue on 20 mg weekly by mouth dexamethasone which was recently held pending further evaluation. I think the dexamethasone has helped her significantly recently. I will reassess her pain control and other psychosocial issue next month. If she still have persistent anxiety and depression, I'm I saw her on an antidepressant.   No orders of the defined types were placed in this encounter.   All questions were answered. The patient knows to call the clinic with any problems, questions or concerns. No barriers to learning was detected. I spent 40 minutes counseling the patient face to face. The total time spent in the appointment was 60 minutes and more than 50% was on counseling and review of test results     [facility], [place], MD 10/09/2014 1:45 PM

## 2014-10-09 NOTE — Assessment & Plan Note (Signed)
Her bone pain has improved with the use of long-acting morphine sulfate and recent kyphoplasty. I recommend reducing MS Contin to 15 mg daily and reduced Dilaudid to 2 mg every 4 hours as needed.

## 2014-10-09 NOTE — Assessment & Plan Note (Signed)
Overall, she tolerated her treatment well. The patient is interested to pursue bone marrow transplant. However, she has a lot of psychosocial issues I recommend deferring bone marrow transplant until a few more months next year so that she can have more time to cope. I recommend we continue at least 3 more cycles of chemotherapy using Velcade, Revlimid and dexamethasone along with Zometa.

## 2014-10-09 NOTE — Telephone Encounter (Signed)
gv adn printed appt sched and avs for pt for NOV adn Dec...sed added tx. °

## 2014-10-09 NOTE — Assessment & Plan Note (Signed)
She appears overwhelmed, anxious, and depressed because she cannot work full-time and is dependent on family and friends to drive her in. I gave her permission to drive again. I reassured her that her emotional response to everything that happens is common. I recommend she reduce her work hours to 20 hours per week. I do not recommend starting her on an antidepressant for now since we are making a lot of medication adjustment. She will continue on 20 mg weekly by mouth dexamethasone which was recently held pending further evaluation. I think the dexamethasone has helped her significantly recently. I will reassess her pain control and other psychosocial issue next month. If she still have persistent anxiety and depression, I'm I saw her on an antidepressant.

## 2014-10-12 ENCOUNTER — Telehealth: Payer: Self-pay | Admitting: *Deleted

## 2014-10-12 ENCOUNTER — Ambulatory Visit (HOSPITAL_COMMUNITY)
Admission: RE | Admit: 2014-10-12 | Discharge: 2014-10-12 | Disposition: A | Discharge status: Home / Self Care | Payer: PRIVATE HEALTH INSURANCE | Source: Ambulatory Visit | Attending: Interventional Radiology | Admitting: Interventional Radiology

## 2014-10-12 DIAGNOSIS — C9000 Multiple myeloma not having achieved remission: Secondary | ICD-10-CM

## 2014-10-12 NOTE — Telephone Encounter (Signed)
Pt left VM with new insurance information. [employer] effective 10/04/14.  Says she was not aware Dr. Alvy Bimler wanted her to restart Revlimid.  She said she thought it is still supposed to be on hold.  In any case she thinks the rx may need to go to new pharmacy.   [facility] and gave them new insurance information. They will run new insurance and let us and pt know if Rx needs to be sent to another pharmacy.

## 2014-10-13 ENCOUNTER — Other Ambulatory Visit: Payer: Self-pay | Admitting: Hematology and Oncology

## 2014-10-14 ENCOUNTER — Telehealth: Payer: Self-pay | Admitting: *Deleted

## 2014-10-14 ENCOUNTER — Encounter: Payer: Self-pay | Admitting: Hematology and Oncology

## 2014-10-14 ENCOUNTER — Encounter (HOSPITAL_COMMUNITY): Payer: Self-pay

## 2014-10-14 NOTE — Progress Notes (Signed)
[place], [phone], approved revlimid from 10/14/14-04/14/15

## 2014-10-14 NOTE — Telephone Encounter (Signed)
Called pt to check on status of Revlimid.  She states she has not heard back from pharmacy yet.  They are supposed to be running the Rx through her new insurance and let her know what the co pay will be.  Informed pt she is to resume Revlimid this Friday if she gets it in time. Suggested she call pharmacy tomorrow to check on it. She verbalized understanding.

## 2014-10-15 ENCOUNTER — Telehealth: Payer: Self-pay | Admitting: *Deleted

## 2014-10-15 ENCOUNTER — Other Ambulatory Visit: Payer: Self-pay | Admitting: Hematology and Oncology

## 2014-10-15 MED ORDER — MIRTAZAPINE 30 MG PO TABS: 30.0000 mg | ORAL_TABLET | Freq: Every day | ORAL | Status: DC

## 2014-10-15 NOTE — Telephone Encounter (Signed)
Fluids added for Friday. Does she need MOnday also? Can you make a mental note to ask how she is doing Monday? She returns again for Monday.

## 2014-10-15 NOTE — Telephone Encounter (Signed)
Received VM from friend, Manus Gunning, Asking nurse to check on pt today because she feels he is "declining."  Called pt, left VM at home and cell phone.  Pt called back from work, and left VM.  States she made herself go to work today but she is feeling very weak. She is not able to eat much.  Weighs 91 lbs this morning.  States "forcing myself to eat" and also says feels "scared and weak."  She is going to a dentist appt today at 12 noon.  She is scheduled to come in for lab and chemo tomorrow.

## 2014-10-15 NOTE — Telephone Encounter (Signed)
Informed pt Dr. Alvy Bimler can see her this afternoon. Informed her Dr. Alvy Bimler will not be here tomorrow.  Pt declined states she thinks she will be ok.  Informed her of new Rx for Remeron to stimulate appetite.  Take at bedtime, start tonight.  Informed IVFs will be added to her treatment tomorrow.   She verbalized understanding.

## 2014-10-16 ENCOUNTER — Other Ambulatory Visit (HOSPITAL_BASED_OUTPATIENT_CLINIC_OR_DEPARTMENT_OTHER): Payer: PRIVATE HEALTH INSURANCE

## 2014-10-16 ENCOUNTER — Telehealth: Payer: Self-pay | Admitting: *Deleted

## 2014-10-16 ENCOUNTER — Ambulatory Visit (HOSPITAL_BASED_OUTPATIENT_CLINIC_OR_DEPARTMENT_OTHER): Payer: PRIVATE HEALTH INSURANCE

## 2014-10-16 DIAGNOSIS — Z5112 Encounter for antineoplastic immunotherapy: Secondary | ICD-10-CM | POA: Diagnosis not present

## 2014-10-16 DIAGNOSIS — C9000 Multiple myeloma not having achieved remission: Secondary | ICD-10-CM

## 2014-10-16 LAB — COMPREHENSIVE METABOLIC PANEL (CC13)
ALT: 21 U/L (ref 0–55)
AST: 15 U/L (ref 5–34)
Albumin: 4.3 g/dL (ref 3.5–5.0)
Alkaline Phosphatase: 113 U/L (ref 40–150)
Anion Gap: 8 mEq/L (ref 3–11)
BILIRUBIN TOTAL: 0.27 mg/dL (ref 0.20–1.20)
BUN: 14 mg/dL (ref 7.0–26.0)
CALCIUM: 9.8 mg/dL (ref 8.4–10.4)
CO2: 25 meq/L (ref 22–29)
Chloride: 107 mEq/L (ref 98–109)
Creatinine: 1.1 mg/dL (ref 0.6–1.1)
GLUCOSE: 105 mg/dL (ref 70–140)
Potassium: 3.9 mEq/L (ref 3.5–5.1)
Sodium: 140 mEq/L (ref 136–145)
Total Protein: 7 g/dL (ref 6.4–8.3)

## 2014-10-16 LAB — CBC WITH DIFFERENTIAL/PLATELET
BASO%: 1.1 % (ref 0.0–2.0)
BASOS ABS: 0 10*3/uL (ref 0.0–0.1)
EOS ABS: 0 10*3/uL (ref 0.0–0.5)
EOS%: 0.4 % (ref 0.0–7.0)
HEMATOCRIT: 37 % (ref 34.8–46.6)
HEMOGLOBIN: 11.9 g/dL (ref 11.6–15.9)
LYMPH%: 21.5 % (ref 14.0–49.7)
MCH: 31.4 pg (ref 25.1–34.0)
MCHC: 32.2 g/dL (ref 31.5–36.0)
MCV: 97.6 fL (ref 79.5–101.0)
MONO#: 0.4 10*3/uL (ref 0.1–0.9)
MONO%: 10 % (ref 0.0–14.0)
NEUT#: 2.6 10*3/uL (ref 1.5–6.5)
NEUT%: 67 % (ref 38.4–76.8)
PLATELETS: 193 10*3/uL (ref 145–400)
RBC: 3.79 10*6/uL (ref 3.70–5.45)
RDW: 14 % (ref 11.2–14.5)
WBC: 3.8 10*3/uL — AB (ref 3.9–10.3)
lymph#: 0.8 10*3/uL — ABNORMAL LOW (ref 0.9–3.3)

## 2014-10-16 MED ORDER — BORTEZOMIB CHEMO SQ INJECTION 3.5 MG (2.5MG/ML)
1.3000 mg/m2 | Freq: Once | INTRAMUSCULAR | Status: AC
  Administered 2014-10-16: 1.75 mg via SUBCUTANEOUS
  Filled 2014-10-16: qty 1.75

## 2014-10-16 MED ORDER — SODIUM CHLORIDE 0.9 % IJ SOLN
10.0000 mL | INTRAMUSCULAR | Status: DC | PRN
  Filled 2014-10-16: qty 10

## 2014-10-16 MED ORDER — ONDANSETRON HCL 8 MG PO TABS
ORAL_TABLET | ORAL | Status: AC
  Filled 2014-10-16: qty 1

## 2014-10-16 MED ORDER — SODIUM CHLORIDE 0.9 % IV SOLN
Freq: Once | INTRAVENOUS | Status: AC
  Administered 2014-10-16: 14:00:00 via INTRAVENOUS

## 2014-10-16 MED ORDER — LENALIDOMIDE 10 MG PO CAPS: 10.0000 mg | ORAL_CAPSULE | Freq: Every day | ORAL | Status: DC

## 2014-10-16 MED ORDER — ONDANSETRON HCL 8 MG PO TABS
8.0000 mg | ORAL_TABLET | Freq: Once | ORAL | Status: AC
  Administered 2014-10-16: 8 mg via ORAL

## 2014-10-16 NOTE — Telephone Encounter (Signed)
[employer] and was on hold for over 10 minutes.

## 2014-10-16 NOTE — Telephone Encounter (Signed)
Faxed Rx for Revlimid to [facility] fax [phone].

## 2014-10-16 NOTE — Telephone Encounter (Signed)
New rx placed on Dr. Calton Dach desk for Revlimid to fax to [facility]

## 2014-10-16 NOTE — Patient Instructions (Signed)
[facility] Discharge Instructions for Patients Receiving Chemotherapy  Today you received the following chemotherapy agents:  Velcade  To help prevent nausea and vomiting after your treatment, we encourage you to take your nausea medication as ordered per MD.Dehydration, Adult Dehydration is when you lose more fluids from the body than you take in. Vital organs like the kidneys, brain, and heart cannot function without a proper amount of fluids and salt. Any loss of fluids from the body can cause dehydration.  CAUSES   Vomiting.  Diarrhea.  Excessive sweating.  Excessive urine output.  Fever. SYMPTOMS  Mild dehydration  Thirst.  Dry lips.  Slightly dry mouth. Moderate dehydration  Very dry mouth.  Sunken eyes.  Skin does not bounce back quickly when lightly pinched and released.  Dark urine and decreased urine production.  Decreased tear production.  Headache. Severe dehydration  Very dry mouth.  Extreme thirst.  Rapid, weak pulse (more than 100 beats per minute at rest).  Cold hands and feet.  Not able to sweat in spite of heat and temperature.  Rapid breathing.  Blue lips.  Confusion and lethargy.  Difficulty being awakened.  Minimal urine production.  No tears. DIAGNOSIS  Your caregiver will diagnose dehydration based on your symptoms and your exam. Blood and urine tests will help confirm the diagnosis. The diagnostic evaluation should also identify the cause of dehydration. TREATMENT  Treatment of mild or moderate dehydration can often be done at home by increasing the amount of fluids that you drink. It is best to drink small amounts of fluid more often. Drinking too much at one time can make vomiting worse. Refer to the home care instructions below. Severe dehydration needs to be treated at the hospital where you will probably be given intravenous (IV) fluids that contain water and electrolytes. HOME CARE INSTRUCTIONS   Ask  your caregiver about specific rehydration instructions.  Drink enough fluids to keep your urine clear or pale yellow.  Drink small amounts frequently if you have nausea and vomiting.  Eat as you normally do.  Avoid:  Foods or drinks high in sugar.  Carbonated drinks.  Juice.  Extremely hot or cold fluids.  Drinks with caffeine.  Fatty, greasy foods.  Alcohol.  Tobacco.  Overeating.  Gelatin desserts.  Wash your hands well to avoid spreading bacteria and viruses.  Only take over-the-counter or prescription medicines for pain, discomfort, or fever as directed by your caregiver.  Ask your caregiver if you should continue all prescribed and over-the-counter medicines.  Keep all follow-up appointments with your caregiver. SEEK MEDICAL CARE IF:  You have abdominal pain and it increases or stays in one area (localizes).  You have a rash, stiff neck, or severe headache.  You are irritable, sleepy, or difficult to awaken.  You are weak, dizzy, or extremely thirsty. SEEK IMMEDIATE MEDICAL CARE IF:   You are unable to keep fluids down or you get worse despite treatment.  You have frequent episodes of vomiting or diarrhea.  You have blood or green matter (bile) in your vomit.  You have blood in your stool or your stool looks black and tarry.  You have not urinated in 6 to 8 hours, or you have only urinated a small amount of very dark urine.  You have a fever.  You faint. MAKE SURE YOU:   Understand these instructions.  Will watch your condition.  Will get help right away if you are not doing well or get worse. Document Released:  11/20/2005 Document Revised: 02/12/2012 Document Reviewed: 07/10/2011 ExitCare Patient Information 2015 [place], [place]. This information is not intended to replace advice given to you by your health care provider. Make sure you discuss any questions you have with your health care provider.    If you develop nausea and vomiting that  is not controlled by your nausea medication, call the clinic.   BELOW ARE SYMPTOMS THAT SHOULD BE REPORTED IMMEDIATELY:  *FEVER GREATER THAN 100.5 F  *CHILLS WITH OR WITHOUT FEVER  NAUSEA AND VOMITING THAT IS NOT CONTROLLED WITH YOUR NAUSEA MEDICATION  *UNUSUAL SHORTNESS OF BREATH  *UNUSUAL BRUISING OR BLEEDING  TENDERNESS IN MOUTH AND THROAT WITH OR WITHOUT PRESENCE OF ULCERS  *URINARY PROBLEMS  *BOWEL PROBLEMS  UNUSUAL RASH Items with * indicate a potential emergency and should be followed up as soon as possible.  Feel free to call the clinic you have any questions or concerns. The clinic phone number is [phone].

## 2014-10-16 NOTE — Telephone Encounter (Signed)
[facility] to check status of Revlmid Rx.  Pt states she has not heard anything from them.  Spoke w/ two reps at [facility].  They say pt's insurance company rejected it being filled at [facility]. They say it needs to be filled at another pharmacy but do not say which pharmacy. They are "checking again" to make sure it is rejected, but could not say how long this might take.   Looked at [employer] and it says [employer] at bottom of card.  Called [facility] and was told that pt is supposed to use [employer] [facility].  [place]

## 2014-10-19 ENCOUNTER — Ambulatory Visit (HOSPITAL_BASED_OUTPATIENT_CLINIC_OR_DEPARTMENT_OTHER): Payer: PRIVATE HEALTH INSURANCE

## 2014-10-19 ENCOUNTER — Other Ambulatory Visit: Payer: Self-pay | Admitting: Hematology and Oncology

## 2014-10-19 VITALS — BP 144/78 | HR 87 | Temp 98.5°F | Resp 18

## 2014-10-19 DIAGNOSIS — E860 Dehydration: Secondary | ICD-10-CM

## 2014-10-19 DIAGNOSIS — C9000 Multiple myeloma not having achieved remission: Secondary | ICD-10-CM

## 2014-10-19 DIAGNOSIS — Z5112 Encounter for antineoplastic immunotherapy: Secondary | ICD-10-CM

## 2014-10-19 MED ORDER — BORTEZOMIB CHEMO SQ INJECTION 3.5 MG (2.5MG/ML)
1.3000 mg/m2 | Freq: Once | INTRAMUSCULAR | Status: AC
  Administered 2014-10-19: 1.75 mg via SUBCUTANEOUS
  Filled 2014-10-19: qty 1.75

## 2014-10-19 MED ORDER — SODIUM CHLORIDE 0.9 % IV SOLN
1000.0000 mL | Freq: Once | INTRAVENOUS | Status: AC
  Administered 2014-10-19: 1000 mL via INTRAVENOUS

## 2014-10-19 MED ORDER — ONDANSETRON HCL 8 MG PO TABS
ORAL_TABLET | ORAL | Status: AC
  Filled 2014-10-19: qty 1

## 2014-10-19 MED ORDER — ONDANSETRON HCL 8 MG PO TABS
8.0000 mg | ORAL_TABLET | Freq: Once | ORAL | Status: AC
  Administered 2014-10-19: 8 mg via ORAL

## 2014-10-19 NOTE — Progress Notes (Signed)
Dr. Alvy Bimler states okay to treat today with HR 107, states she will be by to check on patient.

## 2014-10-19 NOTE — Patient Instructions (Signed)
[facility] Discharge Instructions for Patients Receiving Chemotherapy  Today you received the following chemotherapy agents: Velcade.  To help prevent nausea and vomiting after your treatment, we encourage you to take your nausea medication as prescribed.   If you develop nausea and vomiting that is not controlled by your nausea medication, call the clinic.   BELOW ARE SYMPTOMS THAT SHOULD BE REPORTED IMMEDIATELY:  *FEVER GREATER THAN 100.5 F  *CHILLS WITH OR WITHOUT FEVER  NAUSEA AND VOMITING THAT IS NOT CONTROLLED WITH YOUR NAUSEA MEDICATION  *UNUSUAL SHORTNESS OF BREATH  *UNUSUAL BRUISING OR BLEEDING  TENDERNESS IN MOUTH AND THROAT WITH OR WITHOUT PRESENCE OF ULCERS  *URINARY PROBLEMS  *BOWEL PROBLEMS  UNUSUAL RASH Items with * indicate a potential emergency and should be followed up as soon as possible.  Feel free to call the clinic you have any questions or concerns. The clinic phone number is [phone].    

## 2014-10-23 ENCOUNTER — Other Ambulatory Visit (HOSPITAL_BASED_OUTPATIENT_CLINIC_OR_DEPARTMENT_OTHER): Payer: PRIVATE HEALTH INSURANCE

## 2014-10-23 DIAGNOSIS — C9000 Multiple myeloma not having achieved remission: Secondary | ICD-10-CM

## 2014-10-23 LAB — CBC WITH DIFFERENTIAL/PLATELET
BASO%: 0.3 % (ref 0.0–2.0)
Basophils Absolute: 0 10*3/uL (ref 0.0–0.1)
EOS%: 0.3 % (ref 0.0–7.0)
Eosinophils Absolute: 0 10*3/uL (ref 0.0–0.5)
HCT: 36.9 % (ref 34.8–46.6)
HEMOGLOBIN: 12.1 g/dL (ref 11.6–15.9)
LYMPH%: 13.7 % — ABNORMAL LOW (ref 14.0–49.7)
MCH: 31.8 pg (ref 25.1–34.0)
MCHC: 32.8 g/dL (ref 31.5–36.0)
MCV: 97.1 fL (ref 79.5–101.0)
MONO#: 0.5 10*3/uL (ref 0.1–0.9)
MONO%: 6.9 % (ref 0.0–14.0)
NEUT#: 5.4 10*3/uL (ref 1.5–6.5)
NEUT%: 78.8 % — ABNORMAL HIGH (ref 38.4–76.8)
Platelets: 166 10*3/uL (ref 145–400)
RBC: 3.8 10*6/uL (ref 3.70–5.45)
RDW: 13.5 % (ref 11.2–14.5)
WBC: 6.9 10*3/uL (ref 3.9–10.3)
lymph#: 0.9 10*3/uL (ref 0.9–3.3)

## 2014-10-23 LAB — COMPREHENSIVE METABOLIC PANEL (CC13)
ALBUMIN: 4.2 g/dL (ref 3.5–5.0)
ALT: 52 U/L (ref 0–55)
AST: 26 U/L (ref 5–34)
Alkaline Phosphatase: 127 U/L (ref 40–150)
Anion Gap: 10 mEq/L (ref 3–11)
BUN: 23.5 mg/dL (ref 7.0–26.0)
CALCIUM: 9.9 mg/dL (ref 8.4–10.4)
CHLORIDE: 105 meq/L (ref 98–109)
CO2: 26 meq/L (ref 22–29)
Creatinine: 1.1 mg/dL (ref 0.6–1.1)
Glucose: 98 mg/dl (ref 70–140)
Potassium: 4.1 mEq/L (ref 3.5–5.1)
Sodium: 141 mEq/L (ref 136–145)
Total Bilirubin: 0.22 mg/dL (ref 0.20–1.20)
Total Protein: 6.9 g/dL (ref 6.4–8.3)

## 2014-11-03 ENCOUNTER — Other Ambulatory Visit: Payer: Self-pay | Admitting: *Deleted

## 2014-11-03 ENCOUNTER — Other Ambulatory Visit: Payer: Self-pay | Admitting: Hematology and Oncology

## 2014-11-03 NOTE — Telephone Encounter (Signed)
THIS REFILL REQUEST FOR REVLIMID WAS PLACED ON DR.GORSUCH'S DESK. 

## 2014-11-04 ENCOUNTER — Other Ambulatory Visit: Payer: Self-pay | Admitting: *Deleted

## 2014-11-04 DIAGNOSIS — C9000 Multiple myeloma not having achieved remission: Secondary | ICD-10-CM

## 2014-11-04 MED ORDER — LENALIDOMIDE 10 MG PO CAPS: 10.0000 mg | ORAL_CAPSULE | Freq: Every day | ORAL | Status: DC

## 2014-11-06 ENCOUNTER — Encounter: Payer: Self-pay | Admitting: Hematology and Oncology

## 2014-11-06 ENCOUNTER — Ambulatory Visit (HOSPITAL_BASED_OUTPATIENT_CLINIC_OR_DEPARTMENT_OTHER): Payer: PRIVATE HEALTH INSURANCE | Admitting: Hematology and Oncology

## 2014-11-06 ENCOUNTER — Ambulatory Visit: Payer: PRIVATE HEALTH INSURANCE | Admitting: Nutrition

## 2014-11-06 ENCOUNTER — Telehealth: Payer: Self-pay | Admitting: *Deleted

## 2014-11-06 ENCOUNTER — Other Ambulatory Visit: Payer: Self-pay | Admitting: Hematology and Oncology

## 2014-11-06 ENCOUNTER — Ambulatory Visit (HOSPITAL_BASED_OUTPATIENT_CLINIC_OR_DEPARTMENT_OTHER): Payer: PRIVATE HEALTH INSURANCE

## 2014-11-06 ENCOUNTER — Telehealth: Payer: Self-pay | Admitting: Hematology and Oncology

## 2014-11-06 ENCOUNTER — Other Ambulatory Visit (HOSPITAL_BASED_OUTPATIENT_CLINIC_OR_DEPARTMENT_OTHER): Payer: PRIVATE HEALTH INSURANCE

## 2014-11-06 VITALS — BP 138/73 | HR 102 | Temp 98.1°F | Resp 18 | Ht 60.0 in | Wt 96.2 lb

## 2014-11-06 DIAGNOSIS — C9000 Multiple myeloma not having achieved remission: Secondary | ICD-10-CM

## 2014-11-06 DIAGNOSIS — M8448XA Pathological fracture, other site, initial encounter for fracture: Secondary | ICD-10-CM

## 2014-11-06 DIAGNOSIS — F329 Major depressive disorder, single episode, unspecified: Secondary | ICD-10-CM

## 2014-11-06 DIAGNOSIS — IMO0002 Reserved for concepts with insufficient information to code with codable children: Secondary | ICD-10-CM

## 2014-11-06 DIAGNOSIS — Z5112 Encounter for antineoplastic immunotherapy: Secondary | ICD-10-CM

## 2014-11-06 DIAGNOSIS — K5909 Other constipation: Secondary | ICD-10-CM

## 2014-11-06 DIAGNOSIS — F32A Depression, unspecified: Secondary | ICD-10-CM

## 2014-11-06 DIAGNOSIS — M898X9 Other specified disorders of bone, unspecified site: Secondary | ICD-10-CM

## 2014-11-06 DIAGNOSIS — R110 Nausea: Secondary | ICD-10-CM

## 2014-11-06 LAB — CBC WITH DIFFERENTIAL/PLATELET
BASO%: 0.3 % (ref 0.0–2.0)
Basophils Absolute: 0 10*3/uL (ref 0.0–0.1)
EOS%: 1.6 % (ref 0.0–7.0)
Eosinophils Absolute: 0.1 10*3/uL (ref 0.0–0.5)
HCT: 36.6 % (ref 34.8–46.6)
HGB: 11.9 g/dL (ref 11.6–15.9)
LYMPH%: 11.8 % — ABNORMAL LOW (ref 14.0–49.7)
MCH: 31.8 pg (ref 25.1–34.0)
MCHC: 32.5 g/dL (ref 31.5–36.0)
MCV: 97.9 fL (ref 79.5–101.0)
MONO#: 0.8 10*3/uL (ref 0.1–0.9)
MONO%: 12.3 % (ref 0.0–14.0)
NEUT#: 4.6 10*3/uL (ref 1.5–6.5)
NEUT%: 74 % (ref 38.4–76.8)
NRBC: 0 % (ref 0–0)
Platelets: 206 10*3/uL (ref 145–400)
RBC: 3.74 10*6/uL (ref 3.70–5.45)
RDW: 13.5 % (ref 11.2–14.5)
WBC: 6.2 10*3/uL (ref 3.9–10.3)
lymph#: 0.7 10*3/uL — ABNORMAL LOW (ref 0.9–3.3)

## 2014-11-06 LAB — COMPREHENSIVE METABOLIC PANEL (CC13)
ALBUMIN: 4.3 g/dL (ref 3.5–5.0)
ALT: 29 U/L (ref 0–55)
ANION GAP: 11 meq/L (ref 3–11)
AST: 14 U/L (ref 5–34)
Alkaline Phosphatase: 87 U/L (ref 40–150)
BILIRUBIN TOTAL: 0.32 mg/dL (ref 0.20–1.20)
BUN: 16.9 mg/dL (ref 7.0–26.0)
CALCIUM: 9.9 mg/dL (ref 8.4–10.4)
CO2: 26 mEq/L (ref 22–29)
Chloride: 106 mEq/L (ref 98–109)
Creatinine: 1 mg/dL (ref 0.6–1.1)
EGFR: 61 mL/min/{1.73_m2} — ABNORMAL LOW (ref >90)
Glucose: 120 mg/dl (ref 70–140)
Potassium: 3.3 mEq/L — ABNORMAL LOW (ref 3.5–5.1)
Sodium: 143 mEq/L (ref 136–145)
Total Protein: 6.7 g/dL (ref 6.4–8.3)

## 2014-11-06 MED ORDER — BORTEZOMIB CHEMO SQ INJECTION 3.5 MG (2.5MG/ML)
1.3000 mg/m2 | Freq: Once | INTRAMUSCULAR | Status: AC
  Administered 2014-11-06: 1.75 mg via SUBCUTANEOUS
  Filled 2014-11-06: qty 1.75

## 2014-11-06 MED ORDER — ZOLEDRONIC ACID 4 MG/5ML IV CONC
3.0000 mg | Freq: Once | INTRAVENOUS | Status: AC
  Administered 2014-11-06: 3 mg via INTRAVENOUS
  Filled 2014-11-06: qty 3.75

## 2014-11-06 MED ORDER — ONDANSETRON HCL 8 MG PO TABS
8.0000 mg | ORAL_TABLET | Freq: Once | ORAL | Status: AC
  Administered 2014-11-06: 8 mg via ORAL

## 2014-11-06 MED ORDER — SODIUM CHLORIDE 0.9 % IV SOLN
INTRAVENOUS | Status: AC
  Administered 2014-11-06: 15:00:00 via INTRAVENOUS

## 2014-11-06 MED ORDER — MORPHINE SULFATE ER 15 MG PO TBCR
15.0000 mg | EXTENDED_RELEASE_TABLET | Freq: Every evening | ORAL | Status: DC
Start: 2014-11-06 — End: 2015-02-19

## 2014-11-06 NOTE — Telephone Encounter (Signed)
Pt confirmed labs/ov per 12/04 POF, gave pt AVS..... KJ, sent msg to add chemo

## 2014-11-06 NOTE — Telephone Encounter (Signed)
Per staff message and POF I have scheduled appts. Advised scheduler of appts. JMW  

## 2014-11-06 NOTE — Patient Instructions (Signed)
[facility] Discharge Instructions for Patients Receiving Chemotherapy  Today you received the following chemotherapy agents: Velcade.  To help prevent nausea and vomiting after your treatment, we encourage you to take your nausea medication as prescribed.   If you develop nausea and vomiting that is not controlled by your nausea medication, call the clinic.   BELOW ARE SYMPTOMS THAT SHOULD BE REPORTED IMMEDIATELY:  *FEVER GREATER THAN 100.5 F  *CHILLS WITH OR WITHOUT FEVER  NAUSEA AND VOMITING THAT IS NOT CONTROLLED WITH YOUR NAUSEA MEDICATION  *UNUSUAL SHORTNESS OF BREATH  *UNUSUAL BRUISING OR BLEEDING  TENDERNESS IN MOUTH AND THROAT WITH OR WITHOUT PRESENCE OF ULCERS  *URINARY PROBLEMS  *BOWEL PROBLEMS  UNUSUAL RASH Items with * indicate a potential emergency and should be followed up as soon as possible.  Feel free to call the clinic you have any questions or concerns. The clinic phone number is [phone].  Zoledronic Acid injection (Hypercalcemia, Oncology) What is this medicine? ZOLEDRONIC ACID (ZOE le dron ik AS id) lowers the amount of calcium loss from bone. It is used to treat too much calcium in your blood from cancer. It is also used to prevent complications of cancer that has spread to the bone. This medicine may be used for other purposes; ask your health care provider or pharmacist if you have questions. COMMON BRAND NAME(S): Zometa What should I tell my health care provider before I take this medicine? They need to know if you have any of these conditions: -aspirin-sensitive asthma -cancer, especially if you are receiving medicines used to treat cancer -dental disease or wear dentures -infection -kidney disease -receiving corticosteroids like dexamethasone or prednisone -an unusual or allergic reaction to zoledronic acid, other medicines, foods, dyes, or preservatives -pregnant or trying to get pregnant -breast-feeding How should I  use this medicine? This medicine is for infusion into a vein. It is given by a health care professional in a hospital or clinic setting. Talk to your pediatrician regarding the use of this medicine in children. Special care may be needed. Overdosage: If you think you have taken too much of this medicine contact a poison control center or emergency room at once. NOTE: This medicine is only for you. Do not share this medicine with others. What if I miss a dose? It is important not to miss your dose. Call your doctor or health care professional if you are unable to keep an appointment. What may interact with this medicine? -certain antibiotics given by injection -NSAIDs, medicines for pain and inflammation, like ibuprofen or naproxen -some diuretics like bumetanide, furosemide -teriparatide -thalidomide This list may not describe all possible interactions. Give your health care provider a list of all the medicines, herbs, non-prescription drugs, or dietary supplements you use. Also tell them if you smoke, drink alcohol, or use illegal drugs. Some items may interact with your medicine. What should I watch for while using this medicine? Visit your doctor or health care professional for regular checkups. It may be some time before you see the benefit from this medicine. Do not stop taking your medicine unless your doctor tells you to. Your doctor may order blood tests or other tests to see how you are doing. Women should inform their doctor if they wish to become pregnant or think they might be pregnant. There is a potential for serious side effects to an unborn child. Talk to your health care professional or pharmacist for more information. You should make sure that you get enough  calcium and vitamin D while you are taking this medicine. Discuss the foods you eat and the vitamins you take with your health care professional. Some people who take this medicine have severe bone, joint, and/or muscle pain.  This medicine may also increase your risk for jaw problems or a broken thigh bone. Tell your doctor right away if you have severe pain in your jaw, bones, joints, or muscles. Tell your doctor if you have any pain that does not go away or that gets worse. Tell your dentist and dental surgeon that you are taking this medicine. You should not have major dental surgery while on this medicine. See your dentist to have a dental exam and fix any dental problems before starting this medicine. Take good care of your teeth while on this medicine. Make sure you see your dentist for regular follow-up appointments. What side effects may I notice from receiving this medicine? Side effects that you should report to your doctor or health care professional as soon as possible: -allergic reactions like skin rash, itching or hives, swelling of the face, lips, or tongue -anxiety, confusion, or depression -breathing problems -changes in vision -eye pain -feeling faint or lightheaded, falls -jaw pain, especially after dental work -mouth sores -muscle cramps, stiffness, or weakness -trouble passing urine or change in the amount of urine Side effects that usually do not require medical attention (report to your doctor or health care professional if they continue or are bothersome): -bone, joint, or muscle pain -constipation -diarrhea -fever -hair loss -irritation at site where injected -loss of appetite -nausea, vomiting -stomach upset -trouble sleeping -trouble swallowing -weak or tired This list may not describe all possible side effects. Call your doctor for medical advice about side effects. You may report side effects to FDA at 1-800-FDA-1088. Where should I keep my medicine? This drug is given in a hospital or clinic and will not be stored at home. NOTE: This sheet is a summary. It may not cover all possible information. If you have questions about this medicine, talk to your doctor, pharmacist, or  health care provider.  2015, Elsevier/Gold Standard. (2013-05-01 13:03:13)  Dehydration, Adult Dehydration is when you lose more fluids from the body than you take in. Vital organs like the kidneys, brain, and heart cannot function without a proper amount of fluids and salt. Any loss of fluids from the body can cause dehydration.  CAUSES   Vomiting.  Diarrhea.  Excessive sweating.  Excessive urine output.  Fever. SYMPTOMS  Mild dehydration  Thirst.  Dry lips.  Slightly dry mouth. Moderate dehydration  Very dry mouth.  Sunken eyes.  Skin does not bounce back quickly when lightly pinched and released.  Dark urine and decreased urine production.  Decreased tear production.  Headache. Severe dehydration  Very dry mouth.  Extreme thirst.  Rapid, weak pulse (more than 100 beats per minute at rest).  Cold hands and feet.  Not able to sweat in spite of heat and temperature.  Rapid breathing.  Blue lips.  Confusion and lethargy.  Difficulty being awakened.  Minimal urine production.  No tears. DIAGNOSIS  Your caregiver will diagnose dehydration based on your symptoms and your exam. Blood and urine tests will help confirm the diagnosis. The diagnostic evaluation should also identify the cause of dehydration. TREATMENT  Treatment of mild or moderate dehydration can often be done at home by increasing the amount of fluids that you drink. It is best to drink small amounts of fluid more often. Drinking  too much at one time can make vomiting worse. Refer to the home care instructions below. Severe dehydration needs to be treated at the hospital where you will probably be given intravenous (IV) fluids that contain water and electrolytes. HOME CARE INSTRUCTIONS   Ask your caregiver about specific rehydration instructions.  Drink enough fluids to keep your urine clear or pale yellow.  Drink small amounts frequently if you have nausea and vomiting.  Eat as you  normally do.  Avoid:  Foods or drinks high in sugar.  Carbonated drinks.  Juice.  Extremely hot or cold fluids.  Drinks with caffeine.  Fatty, greasy foods.  Alcohol.  Tobacco.  Overeating.  Gelatin desserts.  Wash your hands well to avoid spreading bacteria and viruses.  Only take over-the-counter or prescription medicines for pain, discomfort, or fever as directed by your caregiver.  Ask your caregiver if you should continue all prescribed and over-the-counter medicines.  Keep all follow-up appointments with your caregiver. SEEK MEDICAL CARE IF:  You have abdominal pain and it increases or stays in one area (localizes).  You have a rash, stiff neck, or severe headache.  You are irritable, sleepy, or difficult to awaken.  You are weak, dizzy, or extremely thirsty. SEEK IMMEDIATE MEDICAL CARE IF:   You are unable to keep fluids down or you get worse despite treatment.  You have frequent episodes of vomiting or diarrhea.  You have blood or green matter (bile) in your vomit.  You have blood in your stool or your stool looks black and tarry.  You have not urinated in 6 to 8 hours, or you have only urinated a small amount of very dark urine.  You have a fever.  You faint. MAKE SURE YOU:   Understand these instructions.  Will watch your condition.  Will get help right away if you are not doing well or get worse. Document Released: 11/20/2005 Document Revised: 02/12/2012 Document Reviewed: 07/10/2011 [facility] Patient Information 2015 [place], [place]. This information is not intended to replace advice given to you by your health care provider. Make sure you discuss any questions you have with your health care provider.

## 2014-11-06 NOTE — Progress Notes (Signed)
63 year old female diagnosed with multiple myeloma.  She is a patient of Dr. Alvy Bimler.  Past medical history includes hypertension, chronic kidney disease, hyperlipidemia, and edema on October 9.  Medications include Lipitor, calcium/phosphorus/vitamin D, Decadron, Lasix, Revlimid, Remeron, Prilosec, Phenergan, Senokot S.  Labs were reviewed.  Height: 60 inches. Weight: 96.2 pounds December 4. Usual body weight: 112 pounds per patient.  In August  BMI: 18.79.  Patient complains of nausea "all the time."  States she has Phenergan, but it does not resolve nausea. Patient reports she is dehydrated and has needed IV fluids. Patient also complains of constipation. Patient states she has been drinking milkshakes, however, she is cold all of the time, and wants to discontinue this.  Nutrition diagnosis: Unintended weight loss related to poor appetite and inadequate oral intake as evidenced by 14% weight loss from usual body weight.  Intervention: Educated patient to consume small, frequent meals and snacks.  Provided handouts on increasing calories and protein. Educated patient on strategies for improving nausea and vomiting.  Provided fact sheets. Reviewed strategies for improving constipation. Recommended increased hydration. Educated patient on strategies for consuming warm, high-calorie, oral nutrition supplements.  Provided several recipe fact sheets. Provided coupons for oral nutrition supplements. Questions were answered.  Teach back method used.  Contact information.  Monitoring, evaluation, goals Patient will tolerate increased calories and protein to minimize further weight loss.  Next visit:patient will contact me with questions and concerns.  **Disclaimer: This note was dictated with voice recognition software. Similar sounding words can inadvertently be transcribed and this note may contain transcription errors which may not have been corrected upon publication of note.**

## 2014-11-07 ENCOUNTER — Other Ambulatory Visit: Payer: Self-pay | Admitting: Hematology and Oncology

## 2014-11-07 NOTE — Assessment & Plan Note (Signed)
She is doing very well. She will continue taking MS Contin once a day. She has tapered to use of Dilaudid significantly since her kyphoplasty.

## 2014-11-07 NOTE — Assessment & Plan Note (Addendum)
We have extensive discussion today. The patient has declined to start bone marrow transplant now due to significant psychosocial adjustment to her diagnosis. I recommend we start her on maintenance Velcade every other week. She agreed to proceed. I am changing her dexamethasone to 4 mg daily for the next 2 weeks to stimulate appetite, energy and control the myeloma.

## 2014-11-07 NOTE — Assessment & Plan Note (Signed)
He is doing well since kyphoplasty. She will continue calcium, vitamin D and Zometa every 3 months

## 2014-11-07 NOTE — Assessment & Plan Note (Signed)
She will continue taking Remeron. 

## 2014-11-07 NOTE — Assessment & Plan Note (Signed)
I recommend she uses laxatives regularly.

## 2014-11-07 NOTE — Assessment & Plan Note (Signed)
I recommend additional IV fluids today. She will continue taking anti-emetics.

## 2014-11-07 NOTE — Progress Notes (Signed)
[facility] OFFICE PROGRESS NOTE  Patient Care Team: Hulan Fess, MD as PCP - General (Family Medicine) Rob Hickman, MD as Consulting Physician (Interventional Radiology)  SUMMARY OF ONCOLOGIC HISTORY: Oncology History   Multiple myeloma without remission; Calcium 13.3, Creatinine 2.83, Hg 8.3, Bone lesions present, Beta 2 microglobulin 12.8, Albumin 3.5, IgA 1760 and kappa light chains 1670. Bone marrow biopsy showed 82% involvement.   Primary site: Multiple Myeloma   Staging method: AJCC 6th Edition   Clinical: Stage IIIB signed by Heath Lark, MD on 07/13/2014 12:38 PM   Summary: Stage IIIB       Multiple myeloma without remission   07/07/2014 - 07/10/2014 Hospital Admission She was admitted to the hospital for management of malignant hypercalcemia and had bone marrow biopsy which confirmed diagnosis of multiple myeloma. The patient received one unit of blood transfusion due to severe anemia.   07/08/2014 Bone Marrow Biopsy The patient had bone marrow biopsy that confirmed diagnosis.   07/08/2014 Pathology Results Accession: [id number] showed 82% involvement of bone marrow.   07/08/2014 - 10/19/2014 Chemotherapy She was started on Velcade along with dexamethasone.   08/21/2014 - 10/19/2014 Chemotherapy I added Revlimid and Zometa to her chemotherapy regimen.   09/24/2014 Pathology Results Accession: [id number] T12 biopsy showed plasma cells involvement.   09/24/2014 Procedure she has kyphoplasty performed.   09/30/2014 Bone Marrow Biopsy Accession: [id number] bone marrow biopsy showed residual plasma cell, approximately 11%   11/06/2014 -  Chemotherapy She was started on Velcade maintenance every other week    INTERVAL HISTORY: Please see below for problem oriented charting. She returns to discuss test results and prior to chemotherapy. She is afraid to take Remeron cause she thinks it causes constipation. The Remeron has helped with sleep and depression. She complained of  mild nausea, depression and constipation.  REVIEW OF SYSTEMS:   Constitutional: Denies fevers, chills or abnormal weight loss Eyes: Denies blurriness of vision Ears, nose, mouth, throat, and face: Denies mucositis or sore throat Respiratory: Denies cough, dyspnea or wheezes Gastrointestinal:  Denies nausea, heartburn or change in bowel habits Skin: Denies abnormal skin rashes Lymphatics: Denies new lymphadenopathy or easy bruising Neurological:Denies numbness, tingling or new weaknesses Behavioral/Psych: Mood is stable, no new changes  All other systems were reviewed with the patient and are negative.  I have reviewed the past medical history, past surgical history, social history and family history with the patient and they are unchanged from previous note.  ALLERGIES:  is allergic to hydrocodone; ace inhibitors; and cephalexin.  MEDICATIONS:  Current Outpatient Prescriptions  Medication Sig Dispense Refill  . acyclovir (ZOVIRAX) 400 MG tablet Take 1 tablet (400 mg total) by mouth daily. 30 tablet 6  . amLODipine (NORVASC) 10 MG tablet TAKE 1 TABLET BY MOUTH EVERY DAY 30 tablet 0  . aspirin 81 MG tablet Take 81 mg by mouth daily.    Marland Kitchen atorvastatin (LIPITOR) 10 MG tablet Take 10 mg by mouth every morning.     . Calcium-Phosphorus-Vitamin D (CITRACAL +D3 PO) Take 1 tablet by mouth daily. 500 u-$RemoveBefo'400mg'[id number]$     . cholecalciferol (VITAMIN D) 1000 UNITS tablet Take 2,000 Units by mouth every morning.     Marland Kitchen dexamethasone (DECADRON) 4 MG tablet Take 20 mg by mouth once a week. On Monday.    Marland Kitchen HYDROmorphone (DILAUDID) 2 MG tablet Take 2 mg by mouth every 4 (four) hours as needed for severe pain.    Marland Kitchen lenalidomide (REVLIMID) 10 MG capsule Take 1 capsule (10  mg total) by mouth daily. For 14 days on and 7 days off.  REMs Auth #[id number]. 14 capsule 0  . morphine (MS CONTIN) 15 MG 12 hr tablet Take 1 tablet (15 mg total) by mouth every evening. 30 tablet 0  . omeprazole (PRILOSEC) 20 MG capsule Take 20 mg  by mouth every morning.     . polyvinyl alcohol (LIQUIFILM TEARS) 1.4 % ophthalmic solution Place 1 drop into both eyes as needed for dry eyes.     . promethazine (PHENERGAN) 25 MG tablet Take 1 tablet (25 mg total) by mouth every 6 (six) hours as needed for nausea or vomiting. 60 tablet 3  . senna-docusate (SENOKOT-S) 8.6-50 MG per tablet Take 2 tablets by mouth 2 (two) times daily. 60 tablet 3   No current facility-administered medications for this visit.    PHYSICAL EXAMINATION: ECOG PERFORMANCE STATUS: 1 - Symptomatic but completely ambulatory  Filed Vitals:   11/06/14 1241  BP: 138/73  Pulse: 102  Temp: 98.1 F (36.7 C)  Resp: 18   Filed Weights   11/06/14 1241  Weight: 96 lb 3.2 oz (43.636 kg)    GENERAL:alert, no distress and comfortable. She looks thin SKIN: skin color, texture, turgor are normal, no rashes or significant lesions EYES: normal, Conjunctiva are pink and non-injected, sclera clear OROPHARYNX:no exudate, no erythema and lips, buccal mucosa, and tongue normal  NECK: supple, thyroid normal size, non-tender, without nodularity LYMPH:  no palpable lymphadenopathy in the cervical, axillary or inguinal LUNGS: clear to auscultation and percussion with normal breathing effort HEART: regular rate & rhythm and no murmurs and no lower extremity edema ABDOMEN:abdomen soft, non-tender and normal bowel sounds Musculoskeletal:no cyanosis of digits and no clubbing  NEURO: alert & oriented x 3 with fluent speech, no focal motor/sensory deficits  LABORATORY DATA:  I have reviewed the data as listed    Component Value Date/Time   NA 143 11/06/2014 1320   NA 143 09/24/2014 0722   K 3.3* 11/06/2014 1320   K 3.8 09/24/2014 0722   CL 105 09/24/2014 0722   CO2 26 11/06/2014 1320   CO2 27 09/24/2014 0722   GLUCOSE 120 11/06/2014 1320   GLUCOSE 80 09/24/2014 0722   BUN 16.9 11/06/2014 1320   BUN 17 09/24/2014 0722   CREATININE 1.0 11/06/2014 1320   CREATININE 1.22*  09/24/2014 0722   CALCIUM 9.9 11/06/2014 1320   CALCIUM 8.6 09/24/2014 0722   PROT 6.7 11/06/2014 1320   PROT 7.2 07/13/2014 1222   ALBUMIN 4.3 11/06/2014 1320   ALBUMIN 3.9 07/13/2014 1222   AST 14 11/06/2014 1320   AST 22 07/13/2014 1222   ALT 29 11/06/2014 1320   ALT 75* 07/13/2014 1222   ALKPHOS 87 11/06/2014 1320   ALKPHOS 90 07/13/2014 1222   BILITOT 0.32 11/06/2014 1320   BILITOT 0.4 07/13/2014 1222   GFRNONAA 47* 09/24/2014 0722   GFRAA 54* 09/24/2014 0722    No results found for: SPEP, UPEP  Lab Results  Component Value Date   WBC 6.2 11/06/2014   NEUTROABS 4.6 11/06/2014   HGB 11.9 11/06/2014   HCT 36.6 11/06/2014   MCV 97.9 11/06/2014   PLT 206 11/06/2014      Chemistry      Component Value Date/Time   NA 143 11/06/2014 1320   NA 143 09/24/2014 0722   K 3.3* 11/06/2014 1320   K 3.8 09/24/2014 0722   CL 105 09/24/2014 0722   CO2 26 11/06/2014 1320   CO2 27  09/24/2014 0722   BUN 16.9 11/06/2014 1320   BUN 17 09/24/2014 0722   CREATININE 1.0 11/06/2014 1320   CREATININE 1.22* 09/24/2014 0722      Component Value Date/Time   CALCIUM 9.9 11/06/2014 1320   CALCIUM 8.6 09/24/2014 0722   ALKPHOS 87 11/06/2014 1320   ALKPHOS 90 07/13/2014 1222   AST 14 11/06/2014 1320   AST 22 07/13/2014 1222   ALT 29 11/06/2014 1320   ALT 75* 07/13/2014 1222   BILITOT 0.32 11/06/2014 1320   BILITOT 0.4 07/13/2014 1222      ASSESSMENT & PLAN:  Multiple myeloma without remission We have extensive discussion today. The patient has declined to start bone marrow transplant now due to significant psychosocial adjustment to her diagnosis. I recommend we start her on maintenance Velcade every other week. She agreed to proceed.  Bone pain She is doing very well. She will continue taking MS Contin once a day. She has tapered to use of Dilaudid significantly since her kyphoplasty.  Compression fracture He is doing well since kyphoplasty. She will continue calcium, vitamin  D and Zometa every 3 months  Constipation I recommend she uses laxatives regularly.  Nausea without vomiting I recommend additional IV fluids today. She will continue taking anti-emetics.  Depression She will continue taking Remeron.   Orders Placed This Encounter  Procedures  . SPEP & IFE with QIG    Standing Status: Future     Number of Occurrences:      Standing Expiration Date: 12/11/2015  . Kappa/lambda light chains    Standing Status: Future     Number of Occurrences:      Standing Expiration Date: 12/11/2015  . Beta 2 microglobulin, serum    Standing Status: Future     Number of Occurrences:      Standing Expiration Date: 12/11/2015   All questions were answered. The patient knows to call the clinic with any problems, questions or concerns. No barriers to learning was detected. I spent 30 minutes counseling the patient face to face. The total time spent in the appointment was 40 minutes and more than 50% was on counseling and review of test results     [facility], [place], MD 11/07/2014 8:37 PM

## 2014-11-09 ENCOUNTER — Ambulatory Visit: Payer: PRIVATE HEALTH INSURANCE

## 2014-11-20 ENCOUNTER — Other Ambulatory Visit (HOSPITAL_BASED_OUTPATIENT_CLINIC_OR_DEPARTMENT_OTHER): Payer: PRIVATE HEALTH INSURANCE

## 2014-11-20 ENCOUNTER — Encounter: Payer: Self-pay | Admitting: Hematology and Oncology

## 2014-11-20 ENCOUNTER — Telehealth: Payer: Self-pay | Admitting: *Deleted

## 2014-11-20 ENCOUNTER — Ambulatory Visit (HOSPITAL_BASED_OUTPATIENT_CLINIC_OR_DEPARTMENT_OTHER): Payer: PRIVATE HEALTH INSURANCE

## 2014-11-20 ENCOUNTER — Ambulatory Visit (HOSPITAL_BASED_OUTPATIENT_CLINIC_OR_DEPARTMENT_OTHER): Payer: PRIVATE HEALTH INSURANCE | Admitting: Hematology and Oncology

## 2014-11-20 ENCOUNTER — Telehealth: Payer: Self-pay | Admitting: Hematology and Oncology

## 2014-11-20 VITALS — BP 119/69 | HR 75 | Temp 98.3°F | Resp 18 | Ht 60.0 in | Wt 98.1 lb

## 2014-11-20 DIAGNOSIS — D630 Anemia in neoplastic disease: Secondary | ICD-10-CM

## 2014-11-20 DIAGNOSIS — C9000 Multiple myeloma not having achieved remission: Secondary | ICD-10-CM

## 2014-11-20 DIAGNOSIS — M898X9 Other specified disorders of bone, unspecified site: Secondary | ICD-10-CM

## 2014-11-20 DIAGNOSIS — F32A Depression, unspecified: Secondary | ICD-10-CM

## 2014-11-20 DIAGNOSIS — Z5112 Encounter for antineoplastic immunotherapy: Secondary | ICD-10-CM

## 2014-11-20 DIAGNOSIS — M899 Disorder of bone, unspecified: Secondary | ICD-10-CM

## 2014-11-20 DIAGNOSIS — IMO0002 Reserved for concepts with insufficient information to code with codable children: Secondary | ICD-10-CM

## 2014-11-20 DIAGNOSIS — R110 Nausea: Secondary | ICD-10-CM

## 2014-11-20 DIAGNOSIS — F329 Major depressive disorder, single episode, unspecified: Secondary | ICD-10-CM

## 2014-11-20 LAB — COMPREHENSIVE METABOLIC PANEL (CC13)
ALBUMIN: 4 g/dL (ref 3.5–5.0)
ALT: 33 U/L (ref 0–55)
AST: 15 U/L (ref 5–34)
Alkaline Phosphatase: 67 U/L (ref 40–150)
Anion Gap: 11 mEq/L (ref 3–11)
BUN: 25.5 mg/dL (ref 7.0–26.0)
CALCIUM: 9.1 mg/dL (ref 8.4–10.4)
CHLORIDE: 106 meq/L (ref 98–109)
CO2: 23 mEq/L (ref 22–29)
Creatinine: 1 mg/dL (ref 0.6–1.1)
EGFR: 63 mL/min/{1.73_m2} — ABNORMAL LOW (ref >90)
Glucose: 110 mg/dl (ref 70–140)
Potassium: 4 mEq/L (ref 3.5–5.1)
Sodium: 140 mEq/L (ref 136–145)
Total Bilirubin: 0.23 mg/dL (ref 0.20–1.20)
Total Protein: 6.3 g/dL — ABNORMAL LOW (ref 6.4–8.3)

## 2014-11-20 LAB — CBC WITH DIFFERENTIAL/PLATELET
BASO%: 0 % (ref 0.0–2.0)
BASOS ABS: 0 10*3/uL (ref 0.0–0.1)
EOS%: 0 % (ref 0.0–7.0)
Eosinophils Absolute: 0 10*3/uL (ref 0.0–0.5)
HCT: 34.9 % (ref 34.8–46.6)
HEMOGLOBIN: 11.5 g/dL — AB (ref 11.6–15.9)
LYMPH#: 0.4 10*3/uL — AB (ref 0.9–3.3)
LYMPH%: 4.3 % — ABNORMAL LOW (ref 14.0–49.7)
MCH: 31.7 pg (ref 25.1–34.0)
MCHC: 33 g/dL (ref 31.5–36.0)
MCV: 96.1 fL (ref 79.5–101.0)
MONO#: 0.2 10*3/uL (ref 0.1–0.9)
MONO%: 1.9 % (ref 0.0–14.0)
NEUT#: 7.6 10*3/uL — ABNORMAL HIGH (ref 1.5–6.5)
NEUT%: 93.8 % — ABNORMAL HIGH (ref 38.4–76.8)
Platelets: 241 10*3/uL (ref 145–400)
RBC: 3.63 10*6/uL — ABNORMAL LOW (ref 3.70–5.45)
RDW: 14.3 % (ref 11.2–14.5)
WBC: 8.1 10*3/uL (ref 3.9–10.3)

## 2014-11-20 MED ORDER — BORTEZOMIB CHEMO SQ INJECTION 3.5 MG (2.5MG/ML)
1.3000 mg/m2 | Freq: Once | INTRAMUSCULAR | Status: AC
  Administered 2014-11-20: 1.75 mg via SUBCUTANEOUS
  Filled 2014-11-20: qty 1.75

## 2014-11-20 MED ORDER — HYDROMORPHONE HCL 2 MG PO TABS: 2.0000 mg | ORAL_TABLET | Freq: Four times a day (QID) | ORAL | Status: DC | PRN

## 2014-11-20 MED ORDER — ONDANSETRON HCL 8 MG PO TABS
ORAL_TABLET | ORAL | Status: AC
  Filled 2014-11-20: qty 1

## 2014-11-20 MED ORDER — ONDANSETRON HCL 8 MG PO TABS: 8.0000 mg | ORAL_TABLET | Freq: Three times a day (TID) | ORAL | Status: DC | PRN

## 2014-11-20 MED ORDER — ONDANSETRON HCL 8 MG PO TABS
8.0000 mg | ORAL_TABLET | Freq: Once | ORAL | Status: AC
  Administered 2014-11-20: 8 mg via ORAL

## 2014-11-20 NOTE — Telephone Encounter (Signed)
Gave av & cal for Jan. Sent mess to sch tx.

## 2014-11-20 NOTE — Telephone Encounter (Signed)
Per staff message and POF I have scheduled appts. Advised scheduler of appts. JMW  

## 2014-11-20 NOTE — Assessment & Plan Note (Signed)
He is doing well since kyphoplasty. She will continue calcium, vitamin D and Zometa every 3 months, next Rx due in March

## 2014-11-20 NOTE — Assessment & Plan Note (Signed)
She will continue taking Remeron.

## 2014-11-20 NOTE — Telephone Encounter (Signed)
[facility] to notify them of pt's Revlimid has been discontinued.  They will make a note of it.

## 2014-11-20 NOTE — Assessment & Plan Note (Signed)
The patient has declined to start bone marrow transplant now due to significant psychosocial adjustment to her diagnosis. I recommend we start her on maintenance Velcade every other week. She agreed to proceed. She will continue her dexamethasone to 4 mg daily for the next 2 weeks to stimulate appetite, energy and control the myeloma. I will see her again in 1 month for supportive care

## 2014-11-20 NOTE — Progress Notes (Signed)
[place] OFFICE PROGRESS NOTE  Patient Care Team: Hulan Fess, MD as PCP - General (Family Medicine) Rob Hickman, MD as Consulting Physician (Interventional Radiology)  SUMMARY OF ONCOLOGIC HISTORY: Oncology History   Multiple myeloma without remission; Calcium 13.3, Creatinine 2.83, Hg 8.3, Bone lesions present, Beta 2 microglobulin 12.8, Albumin 3.5, IgA 1760 and kappa light chains 1670. Bone marrow biopsy showed 82% involvement.   Primary site: Multiple Myeloma   Staging method: AJCC 6th Edition   Clinical: Stage IIIB signed by Heath Lark, MD on 07/13/2014 12:38 PM   Summary: Stage IIIB       Multiple myeloma without remission   07/07/2014 - 07/10/2014 Hospital Admission She was admitted to the hospital for management of malignant hypercalcemia and had bone marrow biopsy which confirmed diagnosis of multiple myeloma. The patient received one unit of blood transfusion due to severe anemia.   07/08/2014 Bone Marrow Biopsy The patient had bone marrow biopsy that confirmed diagnosis.   07/08/2014 Pathology Results Accession: [id number] showed 82% involvement of bone marrow.   07/08/2014 - 10/19/2014 Chemotherapy She was started on Velcade along with dexamethasone.   08/21/2014 - 10/19/2014 Chemotherapy I added Revlimid and Zometa to her chemotherapy regimen.   09/24/2014 Pathology Results Accession: [id number] biopsy showed plasma cells involvement.   09/24/2014 Procedure she has kyphoplasty performed.   09/30/2014 Bone Marrow Biopsy Accession: [id number] bone marrow biopsy showed residual plasma cell, approximately 11%   11/06/2014 -  Chemotherapy She was started on Velcade maintenance every other week    INTERVAL HISTORY: Please see below for problem oriented charting. She is seen prior to treatment today. She felt better. Appetite has improved. She had persistent nausea but no vomiting. She complained of excessive sedation and reduced her use of Remeron.  REVIEW OF  SYSTEMS:   Constitutional: Denies fevers, chills or abnormal weight loss Eyes: Denies blurriness of vision Ears, nose, mouth, throat, and face: Denies mucositis or sore throat Respiratory: Denies cough, dyspnea or wheezes Cardiovascular: Denies palpitation, chest discomfort or lower extremity swelling Skin: Denies abnormal skin rashes Lymphatics: Denies new lymphadenopathy or easy bruising Neurological:Denies numbness, tingling or new weaknesses Behavioral/Psych: Mood is stable, no new changes  All other systems were reviewed with the patient and are negative.  I have reviewed the past medical history, past surgical history, social history and family history with the patient and they are unchanged from previous note.  ALLERGIES:  is allergic to hydrocodone; ace inhibitors; and cephalexin.  MEDICATIONS:  Current Outpatient Prescriptions  Medication Sig Dispense Refill  . acyclovir (ZOVIRAX) 400 MG tablet Take 1 tablet (400 mg total) by mouth daily. 30 tablet 6  . amLODipine (NORVASC) 10 MG tablet TAKE 1 TABLET BY MOUTH EVERY DAY 30 tablet 0  . atorvastatin (LIPITOR) 10 MG tablet Take 10 mg by mouth every morning.     . Calcium-Phosphorus-Vitamin D (CITRACAL +D3 PO) Take 1 tablet by mouth daily. 500 u-$RemoveBefo'400mg'labMYXUrbLm$     . cholecalciferol (VITAMIN D) 1000 UNITS tablet Take 2,000 Units by mouth every morning.     . [employer] SENNA PLUS 8.6-50 MG per tablet TAKE 2 TABLETS BY MOUTH TWICE A DAY 60 tablet 1  . dexamethasone (DECADRON) 4 MG tablet Take 20 mg by mouth once a week. On Monday.    Marland Kitchen HYDROmorphone (DILAUDID) 2 MG tablet Take 1 tablet (2 mg total) by mouth every 6 (six) hours as needed for severe pain. 60 tablet 0  . morphine (MS CONTIN) 15 MG 12 hr  tablet Take 1 tablet (15 mg total) by mouth every evening. 30 tablet 0  . omeprazole (PRILOSEC) 20 MG capsule Take 20 mg by mouth every morning.     . polyvinyl alcohol (LIQUIFILM TEARS) 1.4 % ophthalmic solution Place 1 drop into both eyes as needed for  dry eyes.     . promethazine (PHENERGAN) 25 MG tablet Take 1 tablet (25 mg total) by mouth every 6 (six) hours as needed for nausea or vomiting. 60 tablet 3  . ondansetron (ZOFRAN) 8 MG tablet Take 1 tablet (8 mg total) by mouth every 8 (eight) hours as needed for nausea. 60 tablet 3   No current facility-administered medications for this visit.    PHYSICAL EXAMINATION: ECOG PERFORMANCE STATUS: 0 - Asymptomatic  Filed Vitals:   11/20/14 1224  BP: 119/69  Pulse: 75  Temp: 98.3 F (36.8 C)  Resp: 18   Filed Weights   11/20/14 1224  Weight: 98 lb 1.6 oz (44.498 kg)    GENERAL:alert, no distress and comfortable. She looked thin SKIN: skin color, texture, turgor are normal, no rashes or significant lesions EYES: normal, Conjunctiva are pink and non-injected, sclera clear OROPHARYNX:no exudate, no erythema and lips, buccal mucosa, and tongue normal  NECK: supple, thyroid normal size, non-tender, without nodularity LYMPH:  no palpable lymphadenopathy in the cervical, axillary or inguinal LUNGS: clear to auscultation and percussion with normal breathing effort HEART: regular rate & rhythm and no murmurs and no lower extremity edema ABDOMEN:abdomen soft, non-tender and normal bowel sounds Musculoskeletal:no cyanosis of digits and no clubbing  NEURO: alert & oriented x 3 with fluent speech, no focal motor/sensory deficits  LABORATORY DATA:  I have reviewed the data as listed    Component Value Date/Time   NA 140 11/20/2014 1212   NA 143 09/24/2014 0722   K 4.0 11/20/2014 1212   K 3.8 09/24/2014 0722   CL 105 09/24/2014 0722   CO2 23 11/20/2014 1212   CO2 27 09/24/2014 0722   GLUCOSE 110 11/20/2014 1212   GLUCOSE 80 09/24/2014 0722   BUN 25.5 11/20/2014 1212   BUN 17 09/24/2014 0722   CREATININE 1.0 11/20/2014 1212   CREATININE 1.22* 09/24/2014 0722   CALCIUM 9.1 11/20/2014 1212   CALCIUM 8.6 09/24/2014 0722   PROT 6.3* 11/20/2014 1212   PROT 7.2 07/13/2014 1222   ALBUMIN  4.0 11/20/2014 1212   ALBUMIN 3.9 07/13/2014 1222   AST 15 11/20/2014 1212   AST 22 07/13/2014 1222   ALT 33 11/20/2014 1212   ALT 75* 07/13/2014 1222   ALKPHOS 67 11/20/2014 1212   ALKPHOS 90 07/13/2014 1222   BILITOT 0.23 11/20/2014 1212   BILITOT 0.4 07/13/2014 1222   GFRNONAA 47* 09/24/2014 0722   GFRAA 54* 09/24/2014 0722    No results found for: SPEP, UPEP  Lab Results  Component Value Date   WBC 8.1 11/20/2014   NEUTROABS 7.6* 11/20/2014   HGB 11.5* 11/20/2014   HCT 34.9 11/20/2014   MCV 96.1 11/20/2014   PLT 241 11/20/2014      Chemistry      Component Value Date/Time   NA 140 11/20/2014 1212   NA 143 09/24/2014 0722   K 4.0 11/20/2014 1212   K 3.8 09/24/2014 0722   CL 105 09/24/2014 0722   CO2 23 11/20/2014 1212   CO2 27 09/24/2014 0722   BUN 25.5 11/20/2014 1212   BUN 17 09/24/2014 0722   CREATININE 1.0 11/20/2014 1212   CREATININE 1.22* 09/24/2014 1607  Component Value Date/Time   CALCIUM 9.1 11/20/2014 1212   CALCIUM 8.6 09/24/2014 0722   ALKPHOS 67 11/20/2014 1212   ALKPHOS 90 07/13/2014 1222   AST 15 11/20/2014 1212   AST 22 07/13/2014 1222   ALT 33 11/20/2014 1212   ALT 75* 07/13/2014 1222   BILITOT 0.23 11/20/2014 1212   BILITOT 0.4 07/13/2014 1222    ASSESSMENT & PLAN:  Multiple myeloma without remission The patient has declined to start bone marrow transplant now due to significant psychosocial adjustment to her diagnosis. I recommend we start her on maintenance Velcade every other week. She agreed to proceed. She will continue her dexamethasone to 4 mg daily for the next 2 weeks to stimulate appetite, energy and control the myeloma. I will see her again in 1 month for supportive care    Anemia in neoplastic disease This is likely due to recent treatment. The patient denies recent history of bleeding such as epistaxis, hematuria or hematochezia. She is asymptomatic from the anemia. I will observe for now.  She does not require  transfusion now. I will continue the chemotherapy at current dose without dosage adjustment.  If the anemia gets progressive worse in the future, I might have to delay her treatment or adjust the chemotherapy dose.   Bone pain She is doing very well. She will continue taking MS Contin once a day and wants to try Dilaudid only. She has tapered to use of Dilaudid significantly since her kyphoplasty.    Compression fracture He is doing well since kyphoplasty. She will continue calcium, vitamin D and Zometa every 3 months, next Rx due in March    Depression She will continue taking Remeron.  Nausea without vomiting I recommend continue taking anti-emetics.   I substituted Zofran for compazine which I thought could be causing her drowsiness  No orders of the defined types were placed in this encounter.   All questions were answered. The patient knows to call the clinic with any problems, questions or concerns. No barriers to learning was detected. I spent 30 minutes counseling the patient face to face. The total time spent in the appointment was 40 minutes and more than 50% was on counseling and review of test results     [facility], [place], MD 11/20/2014 1:00 PM

## 2014-11-20 NOTE — Assessment & Plan Note (Signed)
She is doing very well. She will continue taking MS Contin once a day and wants to try Dilaudid only. She has tapered to use of Dilaudid significantly since her kyphoplasty.

## 2014-11-20 NOTE — Patient Instructions (Signed)
[facility] Discharge Instructions for Patients Receiving Chemotherapy  Today you received the following chemotherapy agents: Velcade.  To help prevent nausea and vomiting after your treatment, we encourage you to take your nausea medication as prescribed.   If you develop nausea and vomiting that is not controlled by your nausea medication, call the clinic.   BELOW ARE SYMPTOMS THAT SHOULD BE REPORTED IMMEDIATELY:  *FEVER GREATER THAN 100.5 F  *CHILLS WITH OR WITHOUT FEVER  NAUSEA AND VOMITING THAT IS NOT CONTROLLED WITH YOUR NAUSEA MEDICATION  *UNUSUAL SHORTNESS OF BREATH  *UNUSUAL BRUISING OR BLEEDING  TENDERNESS IN MOUTH AND THROAT WITH OR WITHOUT PRESENCE OF ULCERS  *URINARY PROBLEMS  *BOWEL PROBLEMS  UNUSUAL RASH Items with * indicate a potential emergency and should be followed up as soon as possible.  Feel free to call the clinic you have any questions or concerns. The clinic phone number is [phone].    

## 2014-11-20 NOTE — Assessment & Plan Note (Signed)
I recommend continue taking anti-emetics.

## 2014-11-20 NOTE — Assessment & Plan Note (Signed)

## 2014-11-24 LAB — SPEP & IFE WITH QIG
ALBUMIN ELP: 63.8 % (ref 55.8–66.1)
ALPHA-2-GLOBULIN: 13.3 % — AB (ref 7.1–11.8)
Alpha-1-Globulin: 4.7 % (ref 2.9–4.9)
BETA GLOBULIN: 6.7 % (ref 4.7–7.2)
Beta 2: 6.1 % (ref 3.2–6.5)
Gamma Globulin: 5.4 % — ABNORMAL LOW (ref 11.1–18.8)
IgA: 97 mg/dL (ref 69–380)
IgG (Immunoglobin G), Serum: 361 mg/dL — ABNORMAL LOW (ref 690–1700)
IgM, Serum: 72 mg/dL (ref 52–322)
M-Spike, %: 0.16 g/dL
TOTAL PROTEIN, SERUM ELECTROPHOR: 6.5 g/dL (ref 6.0–8.3)

## 2014-11-24 LAB — KAPPA/LAMBDA LIGHT CHAINS
Kappa free light chain: 8.83 mg/dL — ABNORMAL HIGH (ref 0.33–1.94)
Kappa:Lambda Ratio: 12.44 — ABNORMAL HIGH (ref 0.26–1.65)
LAMBDA FREE LGHT CHN: 0.71 mg/dL (ref 0.57–2.63)

## 2014-11-24 LAB — BETA 2 MICROGLOBULIN, SERUM: Beta-2 Microglobulin: 2 mg/L (ref <=2.51)

## 2014-12-02 ENCOUNTER — Other Ambulatory Visit: Payer: Self-pay | Admitting: Hematology and Oncology

## 2014-12-02 ENCOUNTER — Ambulatory Visit (HOSPITAL_BASED_OUTPATIENT_CLINIC_OR_DEPARTMENT_OTHER): Payer: PRIVATE HEALTH INSURANCE

## 2014-12-02 ENCOUNTER — Other Ambulatory Visit: Payer: Self-pay | Admitting: *Deleted

## 2014-12-02 DIAGNOSIS — R300 Dysuria: Secondary | ICD-10-CM

## 2014-12-02 DIAGNOSIS — C9000 Multiple myeloma not having achieved remission: Secondary | ICD-10-CM

## 2014-12-02 LAB — URINALYSIS, MICROSCOPIC - CHCC
Bilirubin (Urine): NEGATIVE
Blood: NEGATIVE
Glucose: NEGATIVE mg/dL
Ketones: NEGATIVE mg/dL
LEUKOCYTE ESTERASE: NEGATIVE
Nitrite: NEGATIVE
RBC / HPF: NEGATIVE (ref 0–2)
Specific Gravity, Urine: 1.01 (ref 1.003–1.035)
UROBILINOGEN UR: 0.2 mg/dL (ref 0.2–1)
pH: 6 (ref 4.6–8.0)

## 2014-12-03 ENCOUNTER — Other Ambulatory Visit: Payer: Self-pay | Admitting: *Deleted

## 2014-12-03 LAB — URINE CULTURE

## 2014-12-07 ENCOUNTER — Other Ambulatory Visit (HOSPITAL_BASED_OUTPATIENT_CLINIC_OR_DEPARTMENT_OTHER): Payer: PRIVATE HEALTH INSURANCE

## 2014-12-07 ENCOUNTER — Ambulatory Visit: Payer: PRIVATE HEALTH INSURANCE

## 2014-12-07 ENCOUNTER — Telehealth: Payer: Self-pay | Admitting: *Deleted

## 2014-12-07 DIAGNOSIS — C9000 Multiple myeloma not having achieved remission: Secondary | ICD-10-CM

## 2014-12-07 LAB — CBC WITH DIFFERENTIAL/PLATELET
BASO%: 0 % (ref 0.0–2.0)
Basophils Absolute: 0 10*3/uL (ref 0.0–0.1)
EOS ABS: 0 10*3/uL (ref 0.0–0.5)
EOS%: 0 % (ref 0.0–7.0)
HCT: 36.2 % (ref 34.8–46.6)
HGB: 11.7 g/dL (ref 11.6–15.9)
LYMPH%: 6.8 % — AB (ref 14.0–49.7)
MCH: 31.6 pg (ref 25.1–34.0)
MCHC: 32.3 g/dL (ref 31.5–36.0)
MCV: 97.8 fL (ref 79.5–101.0)
MONO#: 0.5 10*3/uL (ref 0.1–0.9)
MONO%: 6.4 % (ref 0.0–14.0)
NEUT%: 86.8 % — AB (ref 38.4–76.8)
NEUTROS ABS: 7.3 10*3/uL — AB (ref 1.5–6.5)
PLATELETS: 202 10*3/uL (ref 145–400)
RBC: 3.7 10*6/uL (ref 3.70–5.45)
RDW: 15.4 % — ABNORMAL HIGH (ref 11.2–14.5)
WBC: 8.4 10*3/uL (ref 3.9–10.3)
lymph#: 0.6 10*3/uL — ABNORMAL LOW (ref 0.9–3.3)

## 2014-12-07 LAB — COMPREHENSIVE METABOLIC PANEL (CC13)
ALK PHOS: 96 U/L (ref 40–150)
ALT: 211 U/L — AB (ref 0–55)
ANION GAP: 11 meq/L (ref 3–11)
AST: 79 U/L — ABNORMAL HIGH (ref 5–34)
Albumin: 4.2 g/dL (ref 3.5–5.0)
BILIRUBIN TOTAL: 0.27 mg/dL (ref 0.20–1.20)
BUN: 23.3 mg/dL (ref 7.0–26.0)
CO2: 25 meq/L (ref 22–29)
CREATININE: 1 mg/dL (ref 0.6–1.1)
Calcium: 9.3 mg/dL (ref 8.4–10.4)
Chloride: 104 mEq/L (ref 98–109)
EGFR: 62 mL/min/{1.73_m2} — ABNORMAL LOW (ref >90)
Glucose: 106 mg/dl (ref 70–140)
Potassium: 4.2 mEq/L (ref 3.5–5.1)
Sodium: 140 mEq/L (ref 136–145)
TOTAL PROTEIN: 6.8 g/dL (ref 6.4–8.3)

## 2014-12-07 NOTE — Telephone Encounter (Signed)
Pt was informed by Infusion RN today of Culture negative.

## 2014-12-07 NOTE — Telephone Encounter (Signed)
-----   Message from Heath Lark, MD sent at 12/04/2014  7:18 AM EST ----- Regarding: she does not have UTI   ----- Message -----    From: [facility]    Sent: 12/02/2014   2:17 PM      To: Heath Lark, MD

## 2014-12-07 NOTE — Progress Notes (Signed)
Pt here for chemo.  Pt stated she had 3 episodes of diarrhea last Wed later in the day.  Pt noted black stools, no red blood noted.  Pt stated black stools have improved today.  Pt also stated she had stopped taking long acting pain meds. Pt has been taking Dilaudid 4 mg for about a week with much relief from pain.   Dr. Alvy Bimler notified of above info.  MD also reviewed all lab results today.  Per md, NO Chemo today due to abnormal liver function tests. Gave pt written instructions re:  Per Dr. Alvy Bimler: 1.   Pt has NO  UTI. 2.   STOP taking  Lipitor until further evaluated by md. 3.   No alcohol beverages. 4.   Could be viral related. Pt has lab and office visit on 12/18/14 with Dr. Alvy Bimler.  Pt voiced understanding, and understood to increase water intake as tolerated. Pt was stable at discharge via ambulation by self.

## 2014-12-11 ENCOUNTER — Other Ambulatory Visit: Payer: Self-pay | Admitting: Hematology and Oncology

## 2014-12-14 ENCOUNTER — Other Ambulatory Visit: Payer: Self-pay | Admitting: Hematology and Oncology

## 2014-12-18 ENCOUNTER — Ambulatory Visit (HOSPITAL_BASED_OUTPATIENT_CLINIC_OR_DEPARTMENT_OTHER): Payer: PRIVATE HEALTH INSURANCE

## 2014-12-18 ENCOUNTER — Other Ambulatory Visit (HOSPITAL_BASED_OUTPATIENT_CLINIC_OR_DEPARTMENT_OTHER): Payer: PRIVATE HEALTH INSURANCE

## 2014-12-18 ENCOUNTER — Other Ambulatory Visit: Payer: Self-pay | Admitting: *Deleted

## 2014-12-18 ENCOUNTER — Telehealth: Payer: Self-pay | Admitting: Hematology and Oncology

## 2014-12-18 ENCOUNTER — Encounter: Payer: Self-pay | Admitting: Hematology and Oncology

## 2014-12-18 ENCOUNTER — Ambulatory Visit (HOSPITAL_BASED_OUTPATIENT_CLINIC_OR_DEPARTMENT_OTHER): Payer: PRIVATE HEALTH INSURANCE | Admitting: Hematology and Oncology

## 2014-12-18 VITALS — BP 155/80 | HR 75 | Temp 98.4°F | Resp 18 | Ht 60.0 in | Wt 99.7 lb

## 2014-12-18 DIAGNOSIS — C9000 Multiple myeloma not having achieved remission: Secondary | ICD-10-CM

## 2014-12-18 DIAGNOSIS — F32A Depression, unspecified: Secondary | ICD-10-CM

## 2014-12-18 DIAGNOSIS — Z5112 Encounter for antineoplastic immunotherapy: Secondary | ICD-10-CM

## 2014-12-18 DIAGNOSIS — M898X9 Other specified disorders of bone, unspecified site: Secondary | ICD-10-CM

## 2014-12-18 DIAGNOSIS — F329 Major depressive disorder, single episode, unspecified: Secondary | ICD-10-CM

## 2014-12-18 DIAGNOSIS — M899 Disorder of bone, unspecified: Secondary | ICD-10-CM

## 2014-12-18 LAB — COMPREHENSIVE METABOLIC PANEL (CC13)
ALBUMIN: 4.2 g/dL (ref 3.5–5.0)
ALT: 29 U/L (ref 0–55)
AST: 16 U/L (ref 5–34)
Alkaline Phosphatase: 67 U/L (ref 40–150)
Anion Gap: 11 mEq/L (ref 3–11)
BILIRUBIN TOTAL: 0.32 mg/dL (ref 0.20–1.20)
BUN: 24.3 mg/dL (ref 7.0–26.0)
CO2: 26 mEq/L (ref 22–29)
Calcium: 9.4 mg/dL (ref 8.4–10.4)
Chloride: 101 mEq/L (ref 98–109)
Creatinine: 1 mg/dL (ref 0.6–1.1)
EGFR: 59 mL/min/{1.73_m2} — ABNORMAL LOW (ref >90)
Glucose: 106 mg/dl (ref 70–140)
POTASSIUM: 4.2 meq/L (ref 3.5–5.1)
Sodium: 138 mEq/L (ref 136–145)
TOTAL PROTEIN: 6.6 g/dL (ref 6.4–8.3)

## 2014-12-18 LAB — CBC WITH DIFFERENTIAL/PLATELET
BASO%: 0 % (ref 0.0–2.0)
BASOS ABS: 0 10*3/uL (ref 0.0–0.1)
EOS ABS: 0 10*3/uL (ref 0.0–0.5)
EOS%: 0 % (ref 0.0–7.0)
HCT: 37.3 % (ref 34.8–46.6)
HGB: 12.2 g/dL (ref 11.6–15.9)
LYMPH%: 5.1 % — AB (ref 14.0–49.7)
MCH: 32.1 pg (ref 25.1–34.0)
MCHC: 32.7 g/dL (ref 31.5–36.0)
MCV: 98.2 fL (ref 79.5–101.0)
MONO#: 0.2 10*3/uL (ref 0.1–0.9)
MONO%: 2.8 % (ref 0.0–14.0)
NEUT%: 92.1 % — AB (ref 38.4–76.8)
NEUTROS ABS: 7.2 10*3/uL — AB (ref 1.5–6.5)
Platelets: 252 10*3/uL (ref 145–400)
RBC: 3.8 10*6/uL (ref 3.70–5.45)
RDW: 15.2 % — ABNORMAL HIGH (ref 11.2–14.5)
WBC: 7.8 10*3/uL (ref 3.9–10.3)
lymph#: 0.4 10*3/uL — ABNORMAL LOW (ref 0.9–3.3)

## 2014-12-18 MED ORDER — ONDANSETRON HCL 8 MG PO TABS
8.0000 mg | ORAL_TABLET | Freq: Once | ORAL | Status: AC
  Administered 2014-12-18: 8 mg via ORAL

## 2014-12-18 MED ORDER — BORTEZOMIB CHEMO SQ INJECTION 3.5 MG (2.5MG/ML)
1.3000 mg/m2 | Freq: Once | INTRAMUSCULAR | Status: AC
  Administered 2014-12-18: 1.75 mg via SUBCUTANEOUS
  Filled 2014-12-18: qty 1.75

## 2014-12-18 MED ORDER — ONDANSETRON HCL 8 MG PO TABS
ORAL_TABLET | ORAL | Status: AC
  Filled 2014-12-18: qty 1

## 2014-12-18 MED ORDER — SODIUM CHLORIDE 0.9 % IV SOLN: Freq: Once | INTRAVENOUS | Status: DC

## 2014-12-18 MED ORDER — ZOLEDRONIC ACID 4 MG/5ML IV CONC: 4.0000 mg | Freq: Once | INTRAVENOUS | Status: DC

## 2014-12-18 NOTE — Assessment & Plan Note (Signed)
This is improving. She can stop dexamethasone. She is weaning herself off pain medicine

## 2014-12-18 NOTE — Telephone Encounter (Signed)
Gave avs & cal for Jan/Feb. Sent mess to sch tx. °

## 2014-12-18 NOTE — Patient Instructions (Signed)
[facility] Discharge Instructions for Patients Receiving Chemotherapy  Today you received the following chemotherapy agents Velcade.  To help prevent nausea and vomiting after your treatment, we encourage you to take your nausea medication as directed.    If you develop nausea and vomiting that is not controlled by your nausea medication, call the clinic.   BELOW ARE SYMPTOMS THAT SHOULD BE REPORTED IMMEDIATELY:  *FEVER GREATER THAN 100.5 F  *CHILLS WITH OR WITHOUT FEVER  NAUSEA AND VOMITING THAT IS NOT CONTROLLED WITH YOUR NAUSEA MEDICATION  *UNUSUAL SHORTNESS OF BREATH  *UNUSUAL BRUISING OR BLEEDING  TENDERNESS IN MOUTH AND THROAT WITH OR WITHOUT PRESENCE OF ULCERS  *URINARY PROBLEMS  *BOWEL PROBLEMS  UNUSUAL RASH Items with * indicate a potential emergency and should be followed up as soon as possible.  Feel free to call the clinic you have any questions or concerns. The clinic phone number is [phone].    

## 2014-12-18 NOTE — Progress Notes (Signed)
[facility] OFFICE PROGRESS NOTE  Patient Care Team: Hulan Fess, MD as PCP - General (Family Medicine) Rob Hickman, MD as Consulting Physician (Interventional Radiology)  SUMMARY OF ONCOLOGIC HISTORY: Oncology History   Multiple myeloma without remission; Calcium 13.3, Creatinine 2.83, Hg 8.3, Bone lesions present, Beta 2 microglobulin 12.8, Albumin 3.5, IgA 1760 and kappa light chains 1670. Bone marrow biopsy showed 82% involvement.   Primary site: Multiple Myeloma   Staging method: AJCC 6th Edition   Clinical: Stage IIIB signed by Heath Lark, MD on 07/13/2014 12:38 PM   Summary: Stage IIIB       Multiple myeloma without remission   07/07/2014 - 07/10/2014 Hospital Admission She was admitted to the hospital for management of malignant hypercalcemia and had bone marrow biopsy which confirmed diagnosis of multiple myeloma. The patient received one unit of blood transfusion due to severe anemia.   07/08/2014 Bone Marrow Biopsy The patient had bone marrow biopsy that confirmed diagnosis.   07/08/2014 Pathology Results Accession: [id number] showed 82% involvement of bone marrow.   07/08/2014 - 10/19/2014 Chemotherapy She was started on Velcade along with dexamethasone.   08/21/2014 - 10/19/2014 Chemotherapy I added Revlimid and Zometa to her chemotherapy regimen.   09/24/2014 Pathology Results Accession: [id number] biopsy showed plasma cells involvement.   09/24/2014 Procedure she has kyphoplasty performed.   09/30/2014 Bone Marrow Biopsy Accession: [id number] bone marrow biopsy showed residual plasma cell, approximately 11%   11/06/2014 -  Chemotherapy She was started on Velcade maintenance every other week    INTERVAL HISTORY: Please see below for problem oriented charting. Overall, she is improving. She is gaining weight. Her depression has resolved. She continued to have intermittent abdominal cramps and mild diarrhea, resolved with over-the-counter medication  REVIEW  OF SYSTEMS:   Constitutional: Denies fevers, chills or abnormal weight loss Eyes: Denies blurriness of vision Ears, nose, mouth, throat, and face: Denies mucositis or sore throat Respiratory: Denies cough, dyspnea or wheezes Cardiovascular: Denies palpitation, chest discomfort or lower extremity swelling Skin: Denies abnormal skin rashes Lymphatics: Denies new lymphadenopathy or easy bruising Neurological:Denies numbness, tingling or new weaknesses Behavioral/Psych: Mood is stable, no new changes  All other systems were reviewed with the patient and are negative.  I have reviewed the past medical history, past surgical history, social history and family history with the patient and they are unchanged from previous note.  ALLERGIES:  is allergic to hydrocodone; ace inhibitors; and cephalexin.  MEDICATIONS:  Current Outpatient Prescriptions  Medication Sig Dispense Refill  . acyclovir (ZOVIRAX) 400 MG tablet Take 1 tablet (400 mg total) by mouth daily. 30 tablet 6  . amLODipine (NORVASC) 10 MG tablet TAKE 1 TABLET BY MOUTH EVERY DAY 30 tablet 0  . Calcium-Phosphorus-Vitamin D (CITRACAL +D3 PO) Take 1 tablet by mouth daily. 500 u-$RemoveBefo'400mg'lKQtvMvndlR$     . cholecalciferol (VITAMIN D) 1000 UNITS tablet Take 2,000 Units by mouth every morning.     . [facility] SENNA PLUS 8.6-50 MG per tablet TAKE 2 TABLETS BY MOUTH TWICE A DAY 60 tablet 1  . dexamethasone (DECADRON) 4 MG tablet Take 20 mg by mouth once a week. On Monday.    Marland Kitchen HYDROmorphone (DILAUDID) 2 MG tablet Take 1 tablet (2 mg total) by mouth every 6 (six) hours as needed for severe pain. 60 tablet 0  . omeprazole (PRILOSEC) 20 MG capsule Take 20 mg by mouth every morning.     . ondansetron (ZOFRAN) 8 MG tablet Take 1 tablet (8 mg total) by  mouth every 8 (eight) hours as needed for nausea. 60 tablet 3  . polyvinyl alcohol (LIQUIFILM TEARS) 1.4 % ophthalmic solution Place 1 drop into both eyes as needed for dry eyes.     Marland Kitchen atorvastatin (LIPITOR) 10 MG tablet  Take 10 mg by mouth every morning.     . mirtazapine (REMERON) 30 MG tablet TAKE 1 TABLET (30 MG TOTAL) BY MOUTH AT BEDTIME. (Patient not taking: Reported on 12/18/2014) 30 tablet 1  . morphine (MS CONTIN) 15 MG 12 hr tablet Take 1 tablet (15 mg total) by mouth every evening. (Patient not taking: Reported on 12/18/2014) 30 tablet 0   No current facility-administered medications for this visit.    PHYSICAL EXAMINATION: ECOG PERFORMANCE STATUS: 0 - Asymptomatic  Filed Vitals:   12/18/14 1345  BP: 155/80  Pulse: 75  Temp: 98.4 F (36.9 C)  Resp: 18   Filed Weights   12/18/14 1345  Weight: 99 lb 11.2 oz (45.224 kg)    GENERAL:alert, no distress and comfortable SKIN: skin color, texture, turgor are normal, no rashes or significant lesions EYES: normal, Conjunctiva are pink and non-injected, sclera clear OROPHARYNX:no exudate, no erythema and lips, buccal mucosa, and tongue normal  NECK: supple, thyroid normal size, non-tender, without nodularity LYMPH:  no palpable lymphadenopathy in the cervical, axillary or inguinal LUNGS: clear to auscultation and percussion with normal breathing effort HEART: regular rate & rhythm and no murmurs and no lower extremity edema ABDOMEN:abdomen soft, non-tender and normal bowel sounds Musculoskeletal:no cyanosis of digits and no clubbing  NEURO: alert & oriented x 3 with fluent speech, no focal motor/sensory deficits  LABORATORY DATA:  I have reviewed the data as listed    Component Value Date/Time   NA 138 12/18/2014 1331   NA 143 09/24/2014 0722   K 4.2 12/18/2014 1331   K 3.8 09/24/2014 0722   CL 105 09/24/2014 0722   CO2 26 12/18/2014 1331   CO2 27 09/24/2014 0722   GLUCOSE 106 12/18/2014 1331   GLUCOSE 80 09/24/2014 0722   BUN 24.3 12/18/2014 1331   BUN 17 09/24/2014 0722   CREATININE 1.0 12/18/2014 1331   CREATININE 1.22* 09/24/2014 0722   CALCIUM 9.4 12/18/2014 1331   CALCIUM 8.6 09/24/2014 0722   PROT 6.6 12/18/2014 1331   PROT  7.2 07/13/2014 1222   ALBUMIN 4.2 12/18/2014 1331   ALBUMIN 3.9 07/13/2014 1222   AST 16 12/18/2014 1331   AST 22 07/13/2014 1222   ALT 29 12/18/2014 1331   ALT 75* 07/13/2014 1222   ALKPHOS 67 12/18/2014 1331   ALKPHOS 90 07/13/2014 1222   BILITOT 0.32 12/18/2014 1331   BILITOT 0.4 07/13/2014 1222   GFRNONAA 47* 09/24/2014 0722   GFRAA 54* 09/24/2014 0722    No results found for: SPEP, UPEP  Lab Results  Component Value Date   WBC 7.8 12/18/2014   NEUTROABS 7.2* 12/18/2014   HGB 12.2 12/18/2014   HCT 37.3 12/18/2014   MCV 98.2 12/18/2014   PLT 252 12/18/2014      Chemistry      Component Value Date/Time   NA 138 12/18/2014 1331   NA 143 09/24/2014 0722   K 4.2 12/18/2014 1331   K 3.8 09/24/2014 0722   CL 105 09/24/2014 0722   CO2 26 12/18/2014 1331   CO2 27 09/24/2014 0722   BUN 24.3 12/18/2014 1331   BUN 17 09/24/2014 0722   CREATININE 1.0 12/18/2014 1331   CREATININE 1.22* 09/24/2014 0354  Component Value Date/Time   CALCIUM 9.4 12/18/2014 1331   CALCIUM 8.6 09/24/2014 0722   ALKPHOS 67 12/18/2014 1331   ALKPHOS 90 07/13/2014 1222   AST 16 12/18/2014 1331   AST 22 07/13/2014 1222   ALT 29 12/18/2014 1331   ALT 75* 07/13/2014 1222   BILITOT 0.32 12/18/2014 1331   BILITOT 0.4 07/13/2014 1222      ASSESSMENT & PLAN:  Multiple myeloma without remission The patient has declined to start bone marrow transplant now due to significant psychosocial adjustment to her diagnosis. I recommend we start her on maintenance Velcade every other week. She agreed to proceed. Overall, she is improving clinically. She tolerated treatment well. I will recheck her monoclonal protein study next month. She will continue Zometa every 3 months and to continue on prophylaxis with acyclovir      Bone pain This is improving. She can stop dexamethasone. She is weaning herself off pain medicine   Depression This is improving. I recommend discontinuation of Remeron at the  end of the month   she had elevated LFTs could be related to medication side effects. This has resolved. The patient will continue holding off Lipitor  Orders Placed This Encounter  Procedures  . SPEP & IFE with QIG    Standing Status: Future     Number of Occurrences:      Standing Expiration Date: 01/22/2016  . Kappa/lambda light chains    Standing Status: Future     Number of Occurrences:      Standing Expiration Date: 01/22/2016  . Beta 2 microglobulin, serum    Standing Status: Future     Number of Occurrences:      Standing Expiration Date: 01/22/2016   All questions were answered. The patient knows to call the clinic with any problems, questions or concerns. No barriers to learning was detected. I spent 25 minutes counseling the patient face to face. The total time spent in the appointment was 30 minutes and more than 50% was on counseling and review of test results     [facility], [place], MD 12/18/2014 2:37 PM

## 2014-12-18 NOTE — Assessment & Plan Note (Signed)
The patient has declined to start bone marrow transplant now due to significant psychosocial adjustment to her diagnosis. I recommend we start her on maintenance Velcade every other week. She agreed to proceed. Overall, she is improving clinically. She tolerated treatment well. I will recheck her monoclonal protein study next month. She will continue Zometa every 3 months and to continue on prophylaxis with acyclovir

## 2014-12-18 NOTE — Assessment & Plan Note (Signed)
This is improving. I recommend discontinuation of Remeron at the end of the month

## 2014-12-21 ENCOUNTER — Telehealth: Payer: Self-pay | Admitting: *Deleted

## 2014-12-21 NOTE — Telephone Encounter (Signed)
Per staff message and POF I have scheduled appts. Advised scheduler of appts. JMW  

## 2014-12-26 ENCOUNTER — Other Ambulatory Visit: Payer: Self-pay | Admitting: Hematology and Oncology

## 2015-01-01 ENCOUNTER — Ambulatory Visit (HOSPITAL_BASED_OUTPATIENT_CLINIC_OR_DEPARTMENT_OTHER): Payer: PRIVATE HEALTH INSURANCE

## 2015-01-01 ENCOUNTER — Other Ambulatory Visit (HOSPITAL_BASED_OUTPATIENT_CLINIC_OR_DEPARTMENT_OTHER): Payer: PRIVATE HEALTH INSURANCE

## 2015-01-01 DIAGNOSIS — C9000 Multiple myeloma not having achieved remission: Secondary | ICD-10-CM

## 2015-01-01 DIAGNOSIS — Z5112 Encounter for antineoplastic immunotherapy: Secondary | ICD-10-CM

## 2015-01-01 LAB — CBC WITH DIFFERENTIAL/PLATELET
BASO%: 0.6 % (ref 0.0–2.0)
BASOS ABS: 0 10*3/uL (ref 0.0–0.1)
EOS%: 0.4 % (ref 0.0–7.0)
Eosinophils Absolute: 0 10*3/uL (ref 0.0–0.5)
HEMATOCRIT: 36.9 % (ref 34.8–46.6)
HGB: 11.9 g/dL (ref 11.6–15.9)
LYMPH#: 0.8 10*3/uL — AB (ref 0.9–3.3)
LYMPH%: 14.3 % (ref 14.0–49.7)
MCH: 31.3 pg (ref 25.1–34.0)
MCHC: 32.3 g/dL (ref 31.5–36.0)
MCV: 96.9 fL (ref 79.5–101.0)
MONO#: 0.5 10*3/uL (ref 0.1–0.9)
MONO%: 8.3 % (ref 0.0–14.0)
NEUT#: 4.4 10*3/uL (ref 1.5–6.5)
NEUT%: 76.4 % (ref 38.4–76.8)
PLATELETS: 231 10*3/uL (ref 145–400)
RBC: 3.81 10*6/uL (ref 3.70–5.45)
RDW: 15.5 % — ABNORMAL HIGH (ref 11.2–14.5)
WBC: 5.8 10*3/uL (ref 3.9–10.3)

## 2015-01-01 LAB — COMPREHENSIVE METABOLIC PANEL (CC13)
ALBUMIN: 4 g/dL (ref 3.5–5.0)
ALT: 70 U/L — AB (ref 0–55)
AST: 35 U/L — AB (ref 5–34)
Alkaline Phosphatase: 102 U/L (ref 40–150)
Anion Gap: 12 mEq/L — ABNORMAL HIGH (ref 3–11)
BUN: 18.2 mg/dL (ref 7.0–26.0)
CALCIUM: 8.8 mg/dL (ref 8.4–10.4)
CHLORIDE: 105 meq/L (ref 98–109)
CO2: 24 mEq/L (ref 22–29)
Creatinine: 0.9 mg/dL (ref 0.6–1.1)
EGFR: 72 mL/min/{1.73_m2} — ABNORMAL LOW (ref >90)
Glucose: 96 mg/dl (ref 70–140)
Potassium: 3.8 mEq/L (ref 3.5–5.1)
Sodium: 141 mEq/L (ref 136–145)
TOTAL PROTEIN: 6.8 g/dL (ref 6.4–8.3)
Total Bilirubin: 0.34 mg/dL (ref 0.20–1.20)

## 2015-01-01 MED ORDER — ONDANSETRON HCL 8 MG PO TABS
8.0000 mg | ORAL_TABLET | Freq: Once | ORAL | Status: AC
  Administered 2015-01-01: 8 mg via ORAL

## 2015-01-01 MED ORDER — BORTEZOMIB CHEMO SQ INJECTION 3.5 MG (2.5MG/ML)
1.3000 mg/m2 | Freq: Once | INTRAMUSCULAR | Status: AC
  Administered 2015-01-01: 1.75 mg via SUBCUTANEOUS
  Filled 2015-01-01: qty 1.75

## 2015-01-01 MED ORDER — ONDANSETRON HCL 8 MG PO TABS
ORAL_TABLET | ORAL | Status: AC
  Filled 2015-01-01: qty 1

## 2015-01-01 NOTE — Progress Notes (Signed)
Pt states she is "feeling much better" Does not feel like extra fluids are needed today.

## 2015-01-01 NOTE — Patient Instructions (Signed)
[facility] Discharge Instructions for Patients Receiving Chemotherapy  Today you received the following chemotherapy agents velcade   To help prevent nausea and vomiting after your treatment, we encourage you to take your nausea medication as directed  If you develop nausea and vomiting that is not controlled by your nausea medication, call the clinic.   BELOW ARE SYMPTOMS THAT SHOULD BE REPORTED IMMEDIATELY:  *FEVER GREATER THAN 100.5 F  *CHILLS WITH OR WITHOUT FEVER  NAUSEA AND VOMITING THAT IS NOT CONTROLLED WITH YOUR NAUSEA MEDICATION  *UNUSUAL SHORTNESS OF BREATH  *UNUSUAL BRUISING OR BLEEDING  TENDERNESS IN MOUTH AND THROAT WITH OR WITHOUT PRESENCE OF ULCERS  *URINARY PROBLEMS  *BOWEL PROBLEMS  UNUSUAL RASH Items with * indicate a potential emergency and should be followed up as soon as possible.  Feel free to call the clinic you have any questions or concerns. The clinic phone number is [phone].  

## 2015-01-04 ENCOUNTER — Telehealth: Payer: Self-pay

## 2015-01-04 NOTE — Telephone Encounter (Signed)
Returned pt call re: cold, sneezing, stuffy nose, sore throat.  No temp, clear sputum.  Let pt know she should try 12 hour sudafed.  Return call to clinic if she worsens, runs temp or does not resolve.  Pt voiced understanding.

## 2015-01-08 ENCOUNTER — Other Ambulatory Visit: Payer: Self-pay | Admitting: Hematology and Oncology

## 2015-01-13 ENCOUNTER — Other Ambulatory Visit: Payer: Self-pay

## 2015-01-13 DIAGNOSIS — C9000 Multiple myeloma not having achieved remission: Secondary | ICD-10-CM

## 2015-01-13 MED ORDER — ACYCLOVIR 400 MG PO TABS: 400.0000 mg | ORAL_TABLET | Freq: Every day | ORAL | Status: DC

## 2015-01-15 ENCOUNTER — Ambulatory Visit (HOSPITAL_BASED_OUTPATIENT_CLINIC_OR_DEPARTMENT_OTHER): Payer: PRIVATE HEALTH INSURANCE

## 2015-01-15 ENCOUNTER — Other Ambulatory Visit (HOSPITAL_BASED_OUTPATIENT_CLINIC_OR_DEPARTMENT_OTHER): Payer: PRIVATE HEALTH INSURANCE

## 2015-01-15 DIAGNOSIS — Z5112 Encounter for antineoplastic immunotherapy: Secondary | ICD-10-CM

## 2015-01-15 DIAGNOSIS — C9000 Multiple myeloma not having achieved remission: Secondary | ICD-10-CM

## 2015-01-15 LAB — CBC WITH DIFFERENTIAL/PLATELET
BASO%: 0.5 % (ref 0.0–2.0)
Basophils Absolute: 0 10*3/uL (ref 0.0–0.1)
EOS%: 1.4 % (ref 0.0–7.0)
Eosinophils Absolute: 0.1 10*3/uL (ref 0.0–0.5)
HCT: 38.8 % (ref 34.8–46.6)
HGB: 12.8 g/dL (ref 11.6–15.9)
LYMPH%: 14.5 % (ref 14.0–49.7)
MCH: 32.2 pg (ref 25.1–34.0)
MCHC: 33 g/dL (ref 31.5–36.0)
MCV: 97.5 fL (ref 79.5–101.0)
MONO#: 0.6 10*3/uL (ref 0.1–0.9)
MONO%: 11.2 % (ref 0.0–14.0)
NEUT%: 72.4 % (ref 38.4–76.8)
NEUTROS ABS: 4.1 10*3/uL (ref 1.5–6.5)
Platelets: 282 10*3/uL (ref 145–400)
RBC: 3.98 10*6/uL (ref 3.70–5.45)
RDW: 14.3 % (ref 11.2–14.5)
WBC: 5.7 10*3/uL (ref 3.9–10.3)
lymph#: 0.8 10*3/uL — ABNORMAL LOW (ref 0.9–3.3)

## 2015-01-15 LAB — COMPREHENSIVE METABOLIC PANEL (CC13)
ALBUMIN: 4.1 g/dL (ref 3.5–5.0)
ALT: 21 U/L (ref 0–55)
ANION GAP: 9 meq/L (ref 3–11)
AST: 17 U/L (ref 5–34)
Alkaline Phosphatase: 84 U/L (ref 40–150)
BUN: 14.8 mg/dL (ref 7.0–26.0)
CALCIUM: 9.3 mg/dL (ref 8.4–10.4)
CO2: 26 meq/L (ref 22–29)
CREATININE: 0.9 mg/dL (ref 0.6–1.1)
Chloride: 105 mEq/L (ref 98–109)
EGFR: 66 mL/min/{1.73_m2} — ABNORMAL LOW (ref >90)
Glucose: 89 mg/dl (ref 70–140)
Potassium: 3.7 mEq/L (ref 3.5–5.1)
SODIUM: 141 meq/L (ref 136–145)
Total Bilirubin: 0.24 mg/dL (ref 0.20–1.20)
Total Protein: 6.9 g/dL (ref 6.4–8.3)

## 2015-01-15 MED ORDER — ONDANSETRON HCL 8 MG PO TABS
8.0000 mg | ORAL_TABLET | Freq: Once | ORAL | Status: AC
  Administered 2015-01-15: 8 mg via ORAL

## 2015-01-15 MED ORDER — BORTEZOMIB CHEMO SQ INJECTION 3.5 MG (2.5MG/ML)
1.3000 mg/m2 | Freq: Once | INTRAMUSCULAR | Status: AC
  Administered 2015-01-15: 1.75 mg via SUBCUTANEOUS
  Filled 2015-01-15: qty 1.75

## 2015-01-15 MED ORDER — ONDANSETRON HCL 8 MG PO TABS
ORAL_TABLET | ORAL | Status: AC
  Filled 2015-01-15: qty 1

## 2015-01-15 NOTE — Patient Instructions (Signed)
[facility] Discharge Instructions for Patients Receiving Chemotherapy  Today you received the following chemotherapy agents:  Velcade  To help prevent nausea and vomiting after your treatment, we encourage you to take your nausea medication as ordered per MD.   If you develop nausea and vomiting that is not controlled by your nausea medication, call the clinic.   BELOW ARE SYMPTOMS THAT SHOULD BE REPORTED IMMEDIATELY:  *FEVER GREATER THAN 100.5 F  *CHILLS WITH OR WITHOUT FEVER  NAUSEA AND VOMITING THAT IS NOT CONTROLLED WITH YOUR NAUSEA MEDICATION  *UNUSUAL SHORTNESS OF BREATH  *UNUSUAL BRUISING OR BLEEDING  TENDERNESS IN MOUTH AND THROAT WITH OR WITHOUT PRESENCE OF ULCERS  *URINARY PROBLEMS  *BOWEL PROBLEMS  UNUSUAL RASH Items with * indicate a potential emergency and should be followed up as soon as possible.  Feel free to call the clinic you have any questions or concerns. The clinic phone number is [phone].    

## 2015-01-17 ENCOUNTER — Other Ambulatory Visit: Payer: Self-pay | Admitting: Hematology and Oncology

## 2015-01-18 LAB — BETA 2 MICROGLOBULIN, SERUM: Beta-2 Microglobulin: 2.89 mg/L — ABNORMAL HIGH (ref <=2.51)

## 2015-01-19 LAB — SPEP & IFE WITH QIG
Albumin ELP: 62.3 % (ref 55.8–66.1)
Alpha-1-Globulin: 4.9 % (ref 2.9–4.9)
Alpha-2-Globulin: 14.6 % — ABNORMAL HIGH (ref 7.1–11.8)
BETA 2: 7.2 % — AB (ref 3.2–6.5)
Beta Globulin: 6.3 % (ref 4.7–7.2)
Gamma Globulin: 4.7 % — ABNORMAL LOW (ref 11.1–18.8)
IGA: 124 mg/dL (ref 69–380)
IGG (IMMUNOGLOBIN G), SERUM: 373 mg/dL — AB (ref 690–1700)
IgM, Serum: 98 mg/dL (ref 52–322)
M-SPIKE, %: 0.17 g/dL
TOTAL PROTEIN, SERUM ELECTROPHOR: 6.8 g/dL (ref 6.0–8.3)

## 2015-01-19 LAB — KAPPA/LAMBDA LIGHT CHAINS
KAPPA FREE LGHT CHN: 12.3 mg/dL — AB (ref 0.33–1.94)
Kappa:Lambda Ratio: 11.39 — ABNORMAL HIGH (ref 0.26–1.65)
Lambda Free Lght Chn: 1.08 mg/dL (ref 0.57–2.63)

## 2015-01-25 ENCOUNTER — Telehealth: Payer: Self-pay | Admitting: Hematology and Oncology

## 2015-01-25 NOTE — Telephone Encounter (Signed)
returned call adn s.w. pt and r/s appt...done pt ok and aware

## 2015-01-28 ENCOUNTER — Ambulatory Visit: Payer: PRIVATE HEALTH INSURANCE | Admitting: Hematology and Oncology

## 2015-01-28 ENCOUNTER — Ambulatory Visit: Payer: PRIVATE HEALTH INSURANCE

## 2015-01-28 ENCOUNTER — Other Ambulatory Visit: Payer: PRIVATE HEALTH INSURANCE

## 2015-02-03 ENCOUNTER — Telehealth: Payer: Self-pay | Admitting: *Deleted

## 2015-02-03 NOTE — Telephone Encounter (Signed)
"  Can I wear a mask when I come in tomorrow morning?  I need to be seen to get an antibiotic or something." Reports a week of cold or flu and wanted to make sure she can come in tomorrow.  Offered to come in today for these symptoms but is at work.   "I think I'll be okay for one more day.  Started as sore throat, followed with congestion and cough.  I've had a bad headache tried sudafed which worsened the sinus pressure.  Tylenol cold and flu has helped me breath easier.  Tylenol for headache.  Today my temperature = 98.  Yesterday temperature = 99.1 to 100."  Scheduled F/U 02-03-2014 at 11:15 with Dr, Alvy Bimler.

## 2015-02-04 ENCOUNTER — Ambulatory Visit: Payer: PRIVATE HEALTH INSURANCE

## 2015-02-04 ENCOUNTER — Encounter: Payer: Self-pay | Admitting: Hematology and Oncology

## 2015-02-04 ENCOUNTER — Telehealth: Payer: Self-pay | Admitting: *Deleted

## 2015-02-04 ENCOUNTER — Ambulatory Visit (HOSPITAL_BASED_OUTPATIENT_CLINIC_OR_DEPARTMENT_OTHER): Payer: PRIVATE HEALTH INSURANCE | Admitting: Hematology and Oncology

## 2015-02-04 ENCOUNTER — Telehealth: Payer: Self-pay | Admitting: Hematology and Oncology

## 2015-02-04 ENCOUNTER — Other Ambulatory Visit (HOSPITAL_BASED_OUTPATIENT_CLINIC_OR_DEPARTMENT_OTHER): Payer: PRIVATE HEALTH INSURANCE

## 2015-02-04 VITALS — BP 134/69 | HR 96 | Temp 98.8°F | Resp 19 | Ht 60.0 in | Wt 103.2 lb

## 2015-02-04 DIAGNOSIS — IMO0002 Reserved for concepts with insufficient information to code with codable children: Secondary | ICD-10-CM

## 2015-02-04 DIAGNOSIS — C9000 Multiple myeloma not having achieved remission: Secondary | ICD-10-CM

## 2015-02-04 DIAGNOSIS — R748 Abnormal levels of other serum enzymes: Secondary | ICD-10-CM

## 2015-02-04 DIAGNOSIS — J019 Acute sinusitis, unspecified: Secondary | ICD-10-CM

## 2015-02-04 DIAGNOSIS — J018 Other acute sinusitis: Secondary | ICD-10-CM

## 2015-02-04 DIAGNOSIS — M898X9 Other specified disorders of bone, unspecified site: Secondary | ICD-10-CM

## 2015-02-04 HISTORY — DX: Acute sinusitis, unspecified: J01.90

## 2015-02-04 LAB — COMPREHENSIVE METABOLIC PANEL (CC13)
ALBUMIN: 3.4 g/dL — AB (ref 3.5–5.0)
ALT: 147 U/L — AB (ref 0–55)
ANION GAP: 10 meq/L (ref 3–11)
AST: 76 U/L — ABNORMAL HIGH (ref 5–34)
Alkaline Phosphatase: 257 U/L — ABNORMAL HIGH (ref 40–150)
BUN: 15.2 mg/dL (ref 7.0–26.0)
CALCIUM: 9.9 mg/dL (ref 8.4–10.4)
CHLORIDE: 103 meq/L (ref 98–109)
CO2: 24 meq/L (ref 22–29)
Creatinine: 0.9 mg/dL (ref 0.6–1.1)
EGFR: 66 mL/min/{1.73_m2} — AB (ref >90)
GLUCOSE: 93 mg/dL (ref 70–140)
POTASSIUM: 4 meq/L (ref 3.5–5.1)
SODIUM: 136 meq/L (ref 136–145)
TOTAL PROTEIN: 6.9 g/dL (ref 6.4–8.3)
Total Bilirubin: 0.7 mg/dL (ref 0.20–1.20)

## 2015-02-04 LAB — CBC WITH DIFFERENTIAL/PLATELET
BASO%: 0.1 % (ref 0.0–2.0)
BASOS ABS: 0 10*3/uL (ref 0.0–0.1)
EOS ABS: 0.1 10*3/uL (ref 0.0–0.5)
EOS%: 0.5 % (ref 0.0–7.0)
HEMATOCRIT: 37.5 % (ref 34.8–46.6)
HEMOGLOBIN: 12.3 g/dL (ref 11.6–15.9)
LYMPH%: 8.6 % — AB (ref 14.0–49.7)
MCH: 32.2 pg (ref 25.1–34.0)
MCHC: 32.8 g/dL (ref 31.5–36.0)
MCV: 98.2 fL (ref 79.5–101.0)
MONO#: 1.1 10*3/uL — AB (ref 0.1–0.9)
MONO%: 10.5 % (ref 0.0–14.0)
NEUT%: 80.3 % — ABNORMAL HIGH (ref 38.4–76.8)
NEUTROS ABS: 8.3 10*3/uL — AB (ref 1.5–6.5)
PLATELETS: 251 10*3/uL (ref 145–400)
RBC: 3.82 10*6/uL (ref 3.70–5.45)
RDW: 13.4 % (ref 11.2–14.5)
WBC: 10.3 10*3/uL (ref 3.9–10.3)
lymph#: 0.9 10*3/uL (ref 0.9–3.3)

## 2015-02-04 MED ORDER — HYDROMORPHONE HCL 2 MG PO TABS: 2.0000 mg | ORAL_TABLET | Freq: Four times a day (QID) | ORAL | Status: DC | PRN

## 2015-02-04 MED ORDER — MOXIFLOXACIN HCL 400 MG PO TABS: 400.0000 mg | ORAL_TABLET | Freq: Every day | ORAL | Status: DC

## 2015-02-04 NOTE — Assessment & Plan Note (Signed)
She is not well. She has significant elevated liver function tests. She is sick with upper respiratory tract infection. I will cancel her treatment today and reschedule to 2 weeks. She agreed.

## 2015-02-04 NOTE — Telephone Encounter (Signed)
Gave avs & calendar for March. Sent message to schedule treatment. °

## 2015-02-04 NOTE — Assessment & Plan Note (Signed)
She is doing well since kyphoplasty. She will continue calcium, vitamin D and Zometa every 3 months, next Rx due in March

## 2015-02-04 NOTE — Assessment & Plan Note (Signed)
She have clinical signs of upper respiratory tract infection. I recommend she start taking dexamethasone 2 mg daily for week and I will prescribe moxifloxacin for 1 week. We will delay her chemotherapy because of this

## 2015-02-04 NOTE — Telephone Encounter (Signed)
Per staff message and POF I have scheduled appts. Advised scheduler of appts. JMW  

## 2015-02-04 NOTE — Progress Notes (Signed)
[place] OFFICE PROGRESS NOTE  Patient Care Team: Hulan Fess, MD as PCP - General (Family Medicine) Rob Hickman, MD as Consulting Physician (Interventional Radiology)  SUMMARY OF ONCOLOGIC HISTORY: Oncology History   Multiple myeloma without remission; Calcium 13.3, Creatinine 2.83, Hg 8.3, Bone lesions present, Beta 2 microglobulin 12.8, Albumin 3.5, IgA 1760 and kappa light chains 1670. Bone marrow biopsy showed 82% involvement.   Primary site: Multiple Myeloma   Staging method: AJCC 6th Edition   Clinical: Stage IIIB signed by Heath Lark, MD on 07/13/2014 12:38 PM   Summary: Stage IIIB       Multiple myeloma without remission   07/07/2014 - 07/10/2014 Hospital Admission She was admitted to the hospital for management of malignant hypercalcemia and had bone marrow biopsy which confirmed diagnosis of multiple myeloma. The patient received one unit of blood transfusion due to severe anemia.   07/08/2014 Bone Marrow Biopsy The patient had bone marrow biopsy that confirmed diagnosis.   07/08/2014 Pathology Results Accession: [id number] showed 82% involvement of bone marrow.   07/08/2014 - 10/19/2014 Chemotherapy She was started on Velcade along with dexamethasone.   08/21/2014 - 10/19/2014 Chemotherapy I added Revlimid and Zometa to her chemotherapy regimen.   09/24/2014 Pathology Results Accession: [id number] biopsy showed plasma cells involvement.   09/24/2014 Procedure she has kyphoplasty performed.   09/30/2014 Bone Marrow Biopsy Accession: [id number] bone marrow biopsy showed residual plasma cell, approximately 11%   11/06/2014 -  Chemotherapy She was started on Velcade maintenance every other week    INTERVAL HISTORY: Please see below for problem oriented charting. She is seen today prior to chemotherapy. She is feeling ill. Her mother died recently and she travel to a farm  and attended the funeral. Since then, she has nasal congestion, sore throat, and  production of yellow sputum. She denies fever or chills. She has poor appetite.  REVIEW OF SYSTEMS:   Constitutional: Denies fevers, chills or abnormal weight loss Eyes: Denies blurriness of vision Ears, nose, mouth, throat, and face: Denies mucositis or sore throat Cardiovascular: Denies palpitation, chest discomfort or lower extremity swelling Gastrointestinal:  Denies nausea, heartburn or change in bowel habits Skin: Denies abnormal skin rashes Lymphatics: Denies new lymphadenopathy or easy bruising Neurological:Denies numbness, tingling or new weaknesses Behavioral/Psych: Mood is stable, no new changes  All other systems were reviewed with the patient and are negative.  I have reviewed the past medical history, past surgical history, social history and family history with the patient and they are unchanged from previous note.  ALLERGIES:  is allergic to hydrocodone; ace inhibitors; and cephalexin.  MEDICATIONS:  Current Outpatient Prescriptions  Medication Sig Dispense Refill  . acyclovir (ZOVIRAX) 400 MG tablet TAKE 1 TABLET BY MOUTH EVERY DAY 30 tablet 5  . amLODipine (NORVASC) 10 MG tablet TAKE 1 TABLET BY MOUTH EVERY DAY 30 tablet 0  . Calcium-Phosphorus-Vitamin D (CITRACAL +D3 PO) Take 1 tablet by mouth daily. 500 u-$RemoveBefo'400mg'mBvPoRpfoxp$     . cholecalciferol (VITAMIN D) 1000 UNITS tablet Take 2,000 Units by mouth every morning.     . [facility] SENNA PLUS 8.6-50 MG per tablet TAKE 2 TABLETS BY MOUTH TWICE A DAY 60 tablet 1  . dexamethasone (DECADRON) 4 MG tablet Take 20 mg by mouth once a week. On Monday.    Marland Kitchen HYDROmorphone (DILAUDID) 2 MG tablet Take 1 tablet (2 mg total) by mouth every 6 (six) hours as needed for severe pain. 60 tablet 0  . mirtazapine (REMERON) 30 MG tablet  TAKE 1 TABLET (30 MG TOTAL) BY MOUTH AT BEDTIME. 30 tablet 1  . morphine (MS CONTIN) 15 MG 12 hr tablet Take 1 tablet (15 mg total) by mouth every evening. 30 tablet 0  . omeprazole (PRILOSEC) 20 MG capsule Take 20 mg by mouth  every morning.     . ondansetron (ZOFRAN) 8 MG tablet Take 1 tablet (8 mg total) by mouth every 8 (eight) hours as needed for nausea. 60 tablet 3  . polyvinyl alcohol (LIQUIFILM TEARS) 1.4 % ophthalmic solution Place 1 drop into both eyes as needed for dry eyes.     Marland Kitchen moxifloxacin (AVELOX) 400 MG tablet Take 1 tablet (400 mg total) by mouth daily at 8 pm. 7 tablet 0   No current facility-administered medications for this visit.    PHYSICAL EXAMINATION: ECOG PERFORMANCE STATUS: 1 - Symptomatic but completely ambulatory  Filed Vitals:   02/04/15 1057  BP: 134/69  Pulse: 96  Temp: 98.8 F (37.1 C)  Resp: 19   Filed Weights   02/04/15 1057  Weight: 103 lb 3.2 oz (46.811 kg)    GENERAL:alert, no distress and comfortable. She looks thin and cachectic and ill-appearing. SKIN: skin color, texture, turgor are normal, no rashes or significant lesions EYES: normal, Conjunctiva are pink and non-injected, sclera clear OROPHARYNX:no exudate, no erythema and lips, buccal mucosa, and tongue normal . There is mild erythema at the posterior oropharynx NECK: supple, thyroid normal size, non-tender, without nodularity LYMPH:  no palpable lymphadenopathy in the cervical, axillary or inguinal LUNGS: clear to auscultation and percussion with normal breathing effort HEART: regular rate & rhythm and no murmurs and no lower extremity edema ABDOMEN:abdomen soft, non-tender and normal bowel sounds Musculoskeletal:no cyanosis of digits and no clubbing  NEURO: alert & oriented x 3 with fluent speech, no focal motor/sensory deficits  LABORATORY DATA:  I have reviewed the data as listed    Component Value Date/Time   NA 136 02/04/2015 1044   NA 143 09/24/2014 0722   K 4.0 02/04/2015 1044   K 3.8 09/24/2014 0722   CL 105 09/24/2014 0722   CO2 24 02/04/2015 1044   CO2 27 09/24/2014 0722   GLUCOSE 93 02/04/2015 1044   GLUCOSE 80 09/24/2014 0722   BUN 15.2 02/04/2015 1044   BUN 17 09/24/2014 0722    CREATININE 0.9 02/04/2015 1044   CREATININE 1.22* 09/24/2014 0722   CALCIUM 9.9 02/04/2015 1044   CALCIUM 8.6 09/24/2014 0722   PROT 6.9 02/04/2015 1044   PROT 7.2 07/13/2014 1222   ALBUMIN 3.4* 02/04/2015 1044   ALBUMIN 3.9 07/13/2014 1222   AST 76* 02/04/2015 1044   AST 22 07/13/2014 1222   ALT 147* 02/04/2015 1044   ALT 75* 07/13/2014 1222   ALKPHOS 257* 02/04/2015 1044   ALKPHOS 90 07/13/2014 1222   BILITOT 0.70 02/04/2015 1044   BILITOT 0.4 07/13/2014 1222   GFRNONAA 47* 09/24/2014 0722   GFRAA 54* 09/24/2014 0722    No results found for: SPEP, UPEP  Lab Results  Component Value Date   WBC 10.3 02/04/2015   NEUTROABS 8.3* 02/04/2015   HGB 12.3 02/04/2015   HCT 37.5 02/04/2015   MCV 98.2 02/04/2015   PLT 251 02/04/2015      Chemistry      Component Value Date/Time   NA 136 02/04/2015 1044   NA 143 09/24/2014 0722   K 4.0 02/04/2015 1044   K 3.8 09/24/2014 0722   CL 105 09/24/2014 0722   CO2 24 02/04/2015  1044   CO2 27 09/24/2014 0722   BUN 15.2 02/04/2015 1044   BUN 17 09/24/2014 0722   CREATININE 0.9 02/04/2015 1044   CREATININE 1.22* 09/24/2014 0722      Component Value Date/Time   CALCIUM 9.9 02/04/2015 1044   CALCIUM 8.6 09/24/2014 0722   ALKPHOS 257* 02/04/2015 1044   ALKPHOS 90 07/13/2014 1222   AST 76* 02/04/2015 1044   AST 22 07/13/2014 1222   ALT 147* 02/04/2015 1044   ALT 75* 07/13/2014 1222   BILITOT 0.70 02/04/2015 1044   BILITOT 0.4 07/13/2014 1222      ASSESSMENT & PLAN:  Multiple myeloma without remission She is not well. She has significant elevated liver function tests. She is sick with upper respiratory tract infection. I will cancel her treatment today and reschedule to 2 weeks. She agreed.   Bone pain This is improving. She is weaning herself off pain medicine I refill prescription Dilaudid today.     Compression fracture She is doing well since kyphoplasty. She will continue calcium, vitamin D and Zometa every 3  months, next Rx due in March       Other acute sinusitis She have clinical signs of upper respiratory tract infection. I recommend she start taking dexamethasone 2 mg daily for week and I will prescribe moxifloxacin for 1 week. We will delay her chemotherapy because of this   Elevated liver enzymes The cause is unknown. It could be related to chemotherapy or recent illness. We will hold treatment today. I have reviewed all her medications and confirmed that none of them could be the cause of her elevated liver enzymes. I will delay her treatment for 2 weeks and will reassess.    No orders of the defined types were placed in this encounter.   All questions were answered. The patient knows to call the clinic with any problems, questions or concerns. No barriers to learning was detected. I spent 30 minutes counseling the patient face to face. The total time spent in the appointment was 40 minutes and more than 50% was on counseling and review of test results     [facility], [place], MD 02/04/2015 11:47 AM

## 2015-02-04 NOTE — Assessment & Plan Note (Signed)
The cause is unknown. It could be related to chemotherapy or recent illness. We will hold treatment today. I have reviewed all her medications and confirmed that none of them could be the cause of her elevated liver enzymes. I will delay her treatment for 2 weeks and will reassess.

## 2015-02-04 NOTE — Assessment & Plan Note (Signed)
This is improving. She is weaning herself off pain medicine I refill prescription Dilaudid today.

## 2015-02-05 ENCOUNTER — Other Ambulatory Visit: Payer: Self-pay | Admitting: Hematology and Oncology

## 2015-02-10 ENCOUNTER — Telehealth: Payer: Self-pay | Admitting: *Deleted

## 2015-02-10 ENCOUNTER — Other Ambulatory Visit: Payer: Self-pay | Admitting: *Deleted

## 2015-02-10 MED ORDER — HYDROCODONE-HOMATROPINE 5-1.5 MG/5ML PO SYRP: 5.0000 mL | ORAL_SOLUTION | Freq: Four times a day (QID) | ORAL | Status: DC | PRN

## 2015-02-10 NOTE — Telephone Encounter (Signed)
Pt called stating her sinus infection has cleared, but she has a lingering cough. States she tends to have a cough for months after a sinus infection and the cough is wearing her down. In the past cough med with codeine is the only thing that helps. Dr Alvy Bimler is fine with writing rx.

## 2015-02-19 ENCOUNTER — Encounter: Payer: Self-pay | Admitting: Hematology and Oncology

## 2015-02-19 ENCOUNTER — Ambulatory Visit (HOSPITAL_BASED_OUTPATIENT_CLINIC_OR_DEPARTMENT_OTHER): Payer: PRIVATE HEALTH INSURANCE | Admitting: Hematology and Oncology

## 2015-02-19 ENCOUNTER — Ambulatory Visit (HOSPITAL_BASED_OUTPATIENT_CLINIC_OR_DEPARTMENT_OTHER): Payer: PRIVATE HEALTH INSURANCE

## 2015-02-19 ENCOUNTER — Telehealth: Payer: Self-pay | Admitting: Hematology and Oncology

## 2015-02-19 ENCOUNTER — Other Ambulatory Visit (HOSPITAL_BASED_OUTPATIENT_CLINIC_OR_DEPARTMENT_OTHER): Payer: PRIVATE HEALTH INSURANCE

## 2015-02-19 VITALS — BP 132/79 | HR 91 | Temp 98.3°F | Ht 60.0 in | Wt 107.3 lb

## 2015-02-19 DIAGNOSIS — C9000 Multiple myeloma not having achieved remission: Secondary | ICD-10-CM

## 2015-02-19 DIAGNOSIS — Z5112 Encounter for antineoplastic immunotherapy: Secondary | ICD-10-CM

## 2015-02-19 DIAGNOSIS — M898X9 Other specified disorders of bone, unspecified site: Secondary | ICD-10-CM

## 2015-02-19 DIAGNOSIS — R748 Abnormal levels of other serum enzymes: Secondary | ICD-10-CM

## 2015-02-19 DIAGNOSIS — D630 Anemia in neoplastic disease: Secondary | ICD-10-CM

## 2015-02-19 DIAGNOSIS — M899 Disorder of bone, unspecified: Secondary | ICD-10-CM

## 2015-02-19 LAB — COMPREHENSIVE METABOLIC PANEL (CC13)
ALT: 24 U/L (ref 0–55)
ANION GAP: 10 meq/L (ref 3–11)
AST: 14 U/L (ref 5–34)
Albumin: 3.5 g/dL (ref 3.5–5.0)
Alkaline Phosphatase: 100 U/L (ref 40–150)
BILIRUBIN TOTAL: 0.27 mg/dL (ref 0.20–1.20)
BUN: 22.2 mg/dL (ref 7.0–26.0)
CO2: 26 mEq/L (ref 22–29)
CREATININE: 0.8 mg/dL (ref 0.6–1.1)
Calcium: 8.4 mg/dL (ref 8.4–10.4)
Chloride: 102 mEq/L (ref 98–109)
EGFR: 76 mL/min/{1.73_m2} — ABNORMAL LOW (ref >90)
GLUCOSE: 79 mg/dL (ref 70–140)
Potassium: 4 mEq/L (ref 3.5–5.1)
Sodium: 139 mEq/L (ref 136–145)
TOTAL PROTEIN: 6.2 g/dL — AB (ref 6.4–8.3)

## 2015-02-19 LAB — CBC WITH DIFFERENTIAL/PLATELET
BASO%: 0.8 % (ref 0.0–2.0)
Basophils Absolute: 0.1 10*3/uL (ref 0.0–0.1)
EOS%: 1.6 % (ref 0.0–7.0)
Eosinophils Absolute: 0.1 10*3/uL (ref 0.0–0.5)
HCT: 35.6 % (ref 34.8–46.6)
HGB: 11.4 g/dL — ABNORMAL LOW (ref 11.6–15.9)
LYMPH#: 0.7 10*3/uL — AB (ref 0.9–3.3)
LYMPH%: 8.8 % — ABNORMAL LOW (ref 14.0–49.7)
MCH: 31.4 pg (ref 25.1–34.0)
MCHC: 32.1 g/dL (ref 31.5–36.0)
MCV: 97.9 fL (ref 79.5–101.0)
MONO#: 0.6 10*3/uL (ref 0.1–0.9)
MONO%: 8.2 % (ref 0.0–14.0)
NEUT#: 6.4 10*3/uL (ref 1.5–6.5)
NEUT%: 80.6 % — ABNORMAL HIGH (ref 38.4–76.8)
Platelets: 323 10*3/uL (ref 145–400)
RBC: 3.64 10*6/uL — ABNORMAL LOW (ref 3.70–5.45)
RDW: 13.4 % (ref 11.2–14.5)
WBC: 7.9 10*3/uL (ref 3.9–10.3)

## 2015-02-19 MED ORDER — ZOLEDRONIC ACID 4 MG/5ML IV CONC
3.0000 mg | Freq: Once | INTRAVENOUS | Status: AC
  Administered 2015-02-19: 3 mg via INTRAVENOUS
  Filled 2015-02-19: qty 3.75

## 2015-02-19 MED ORDER — ONDANSETRON HCL 8 MG PO TABS
ORAL_TABLET | ORAL | Status: AC
  Filled 2015-02-19: qty 1

## 2015-02-19 MED ORDER — HYDROMORPHONE HCL 2 MG PO TABS: 2.0000 mg | ORAL_TABLET | Freq: Four times a day (QID) | ORAL | Status: DC | PRN

## 2015-02-19 MED ORDER — ONDANSETRON HCL 8 MG PO TABS
8.0000 mg | ORAL_TABLET | Freq: Once | ORAL | Status: AC
  Administered 2015-02-19: 8 mg via ORAL

## 2015-02-19 MED ORDER — SODIUM CHLORIDE 0.9 % IV SOLN
Freq: Once | INTRAVENOUS | Status: AC
  Administered 2015-02-19: 16:00:00 via INTRAVENOUS

## 2015-02-19 MED ORDER — BORTEZOMIB CHEMO SQ INJECTION 3.5 MG (2.5MG/ML)
1.3000 mg/m2 | Freq: Once | INTRAMUSCULAR | Status: AC
  Administered 2015-02-19: 1.75 mg via SUBCUTANEOUS
  Filled 2015-02-19: qty 1.75

## 2015-02-19 NOTE — Assessment & Plan Note (Signed)
The cause is unknown. It has completely recovered. We'll proceed with treatment without dose adjustment

## 2015-02-19 NOTE — Telephone Encounter (Signed)
Gave avs & calendar for March/April. Sent message to schedule treatment. °

## 2015-02-19 NOTE — Assessment & Plan Note (Signed)
This is improving. She is weaning herself off pain medicine I refill prescription Dilaudid today.

## 2015-02-19 NOTE — Progress Notes (Signed)
[place] OFFICE PROGRESS NOTE  Patient Care Team: Hulan Fess, MD as PCP - General (Family Medicine) Luanne Bras, MD as Consulting Physician (Interventional Radiology)  SUMMARY OF ONCOLOGIC HISTORY: Oncology History   Multiple myeloma without remission; Calcium 13.3, Creatinine 2.83, Hg 8.3, Bone lesions present, Beta 2 microglobulin 12.8, Albumin 3.5, IgA 1760 and kappa light chains 1670. Bone marrow biopsy showed 82% involvement.   Primary site: Multiple Myeloma   Staging method: AJCC 6th Edition   Clinical: Stage IIIB signed by Heath Lark, MD on 07/13/2014 12:38 PM   Summary: Stage IIIB       Multiple myeloma without remission   07/07/2014 - 07/10/2014 Hospital Admission She was admitted to the hospital for management of malignant hypercalcemia and had bone marrow biopsy which confirmed diagnosis of multiple myeloma. The patient received one unit of blood transfusion due to severe anemia.   07/08/2014 Bone Marrow Biopsy The patient had bone marrow biopsy that confirmed diagnosis.   07/08/2014 Pathology Results Accession: [id number] showed 82% involvement of bone marrow.   07/08/2014 - 10/19/2014 Chemotherapy She was started on Velcade along with dexamethasone.   08/21/2014 - 10/19/2014 Chemotherapy I added Revlimid and Zometa to her chemotherapy regimen.   09/24/2014 Pathology Results Accession: [id number] biopsy showed plasma cells involvement.   09/24/2014 Procedure she has kyphoplasty performed.   09/30/2014 Bone Marrow Biopsy Accession: [id number] bone marrow biopsy showed residual plasma cell, approximately 11%   11/06/2014 -  Chemotherapy She was started on Velcade maintenance every other week    INTERVAL HISTORY: Please see below for problem oriented charting. She returns today for further follow-up. She has greater energy level while on dexamethasone. She continues to take her pain medication sporadically for bone pain but when she is taking dexamethasone,  that seems to help. She denies further recent infection. Cough has improved. Appetite is stable. Denies skin rash or peripheral neuropathy from treatment. She denies osteonecrosis of the jaw. She continued to take calcium with vitamin D.  REVIEW OF SYSTEMS:   Constitutional: Denies fevers, chills or abnormal weight loss Eyes: Denies blurriness of vision Ears, nose, mouth, throat, and face: Denies mucositis or sore throat Respiratory: Denies cough, dyspnea or wheezes Cardiovascular: Denies palpitation, chest discomfort or lower extremity swelling Gastrointestinal:  Denies nausea, heartburn or change in bowel habits Skin: Denies abnormal skin rashes Lymphatics: Denies new lymphadenopathy or easy bruising Neurological:Denies numbness, tingling or new weaknesses Behavioral/Psych: Mood is stable, no new changes  All other systems were reviewed with the patient and are negative.  I have reviewed the past medical history, past surgical history, social history and family history with the patient and they are unchanged from previous note.  ALLERGIES:  is allergic to hydrocodone; ace inhibitors; and cephalexin.  MEDICATIONS:  Current Outpatient Prescriptions  Medication Sig Dispense Refill  . acyclovir (ZOVIRAX) 400 MG tablet TAKE 1 TABLET BY MOUTH EVERY DAY 30 tablet 5  . amLODipine (NORVASC) 10 MG tablet TAKE 1 TABLET BY MOUTH EVERY DAY 30 tablet 0  . Calcium-Phosphorus-Vitamin D (CITRACAL +D3 PO) Take 1 tablet by mouth daily. 500 u-$RemoveBefo'400mg'gcYTcMuuSgr$     . cholecalciferol (VITAMIN D) 1000 UNITS tablet Take 2,000 Units by mouth every morning.     . [facility] SENNA PLUS 8.6-50 MG per tablet TAKE 2 TABLETS BY MOUTH TWICE A DAY 60 tablet 1  . dexamethasone (DECADRON) 4 MG tablet Take 20 mg by mouth once a week. On Monday.    Marland Kitchen HYDROcodone-homatropine (HYCODAN) 5-1.5 MG/5ML syrup Take 5  mLs by mouth every 6 (six) hours as needed for cough. 473 mL 0  . HYDROmorphone (DILAUDID) 2 MG tablet Take 1 tablet (2 mg total) by  mouth every 6 (six) hours as needed for severe pain. 90 tablet 0  . ondansetron (ZOFRAN) 8 MG tablet Take 1 tablet (8 mg total) by mouth every 8 (eight) hours as needed for nausea. 60 tablet 3  . polyvinyl alcohol (LIQUIFILM TEARS) 1.4 % ophthalmic solution Place 1 drop into both eyes as needed for dry eyes.      No current facility-administered medications for this visit.    PHYSICAL EXAMINATION: ECOG PERFORMANCE STATUS: 1 - Symptomatic but completely ambulatory  Filed Vitals:   02/19/15 1354  BP: 132/79  Pulse: 91  Temp: 98.3 F (36.8 C)   Filed Weights   02/19/15 1354  Weight: 107 lb 4.8 oz (48.671 kg)    GENERAL:alert, no distress and comfortable SKIN: skin color, texture, turgor are normal, no rashes or significant lesions EYES: normal, Conjunctiva are pink and non-injected, sclera clear OROPHARYNX:no exudate, no erythema and lips, buccal mucosa, and tongue normal  NEURO: alert & oriented x 3 with fluent speech, no focal motor/sensory deficits  LABORATORY DATA:  I have reviewed the data as listed    Component Value Date/Time   NA 139 02/19/2015 1331   NA 143 09/24/2014 0722   K 4.0 02/19/2015 1331   K 3.8 09/24/2014 0722   CL 105 09/24/2014 0722   CO2 26 02/19/2015 1331   CO2 27 09/24/2014 0722   GLUCOSE 79 02/19/2015 1331   GLUCOSE 80 09/24/2014 0722   BUN 22.2 02/19/2015 1331   BUN 17 09/24/2014 0722   CREATININE 0.8 02/19/2015 1331   CREATININE 1.22* 09/24/2014 0722   CALCIUM 8.4 02/19/2015 1331   CALCIUM 8.6 09/24/2014 0722   PROT 6.2* 02/19/2015 1331   PROT 7.2 07/13/2014 1222   ALBUMIN 3.5 02/19/2015 1331   ALBUMIN 3.9 07/13/2014 1222   AST 14 02/19/2015 1331   AST 22 07/13/2014 1222   ALT 24 02/19/2015 1331   ALT 75* 07/13/2014 1222   ALKPHOS 100 02/19/2015 1331   ALKPHOS 90 07/13/2014 1222   BILITOT 0.27 02/19/2015 1331   BILITOT 0.4 07/13/2014 1222   GFRNONAA 47* 09/24/2014 0722   GFRAA 54* 09/24/2014 0722    No results found for: SPEP,  UPEP  Lab Results  Component Value Date   WBC 7.9 02/19/2015   NEUTROABS 6.4 02/19/2015   HGB 11.4* 02/19/2015   HCT 35.6 02/19/2015   MCV 97.9 02/19/2015   PLT 323 02/19/2015      Chemistry      Component Value Date/Time   NA 139 02/19/2015 1331   NA 143 09/24/2014 0722   K 4.0 02/19/2015 1331   K 3.8 09/24/2014 0722   CL 105 09/24/2014 0722   CO2 26 02/19/2015 1331   CO2 27 09/24/2014 0722   BUN 22.2 02/19/2015 1331   BUN 17 09/24/2014 0722   CREATININE 0.8 02/19/2015 1331   CREATININE 1.22* 09/24/2014 0722      Component Value Date/Time   CALCIUM 8.4 02/19/2015 1331   CALCIUM 8.6 09/24/2014 0722   ALKPHOS 100 02/19/2015 1331   ALKPHOS 90 07/13/2014 1222   AST 14 02/19/2015 1331   AST 22 07/13/2014 1222   ALT 24 02/19/2015 1331   ALT 75* 07/13/2014 1222   BILITOT 0.27 02/19/2015 1331   BILITOT 0.4 07/13/2014 1222      ASSESSMENT & PLAN:  Multiple  myeloma without remission The patient has completely recovered from recent side effects of treatment. Will proceed with treatment today without dose adjustment. Her most recent M spike show she is in very good partial response. She is due for Zometa today.    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 The patient denies recent history of bleeding such as epistaxis, hematuria or hematochezia. She is asymptomatic from the anemia. I will observe for now.     Bone pain This is improving. She is weaning herself off pain medicine I refill prescription Dilaudid today.     Elevated liver enzymes The cause is unknown. It has completely recovered. We'll proceed with treatment without dose adjustment    Orders Placed This Encounter  Procedures  . SPEP & IFE with QIG    Standing Status: Future     Number of Occurrences:      Standing Expiration Date: 03/25/2016  . Kappa/lambda light chains    Standing Status: Future     Number of Occurrences:      Standing Expiration Date: 03/25/2016   . Beta 2 microglobulin, serum    Standing Status: Future     Number of Occurrences:      Standing Expiration Date: 03/25/2016   All questions were answered. The patient knows to call the clinic with any problems, questions or concerns. No barriers to learning was detected. I spent 25 minutes counseling the patient face to face. The total time spent in the appointment was 30 minutes and more than 50% was on counseling and review of test results     [facility], [place], MD 02/19/2015 2:43 PM

## 2015-02-19 NOTE — Patient Instructions (Signed)
[facility] Discharge Instructions for Patients Receiving Chemotherapy  Today you received the following chemotherapy agents Velcade. To help prevent nausea and vomiting after your treatment, we encourage you to take your nausea medication as directed.  If you develop nausea and vomiting that is not controlled by your nausea medication, call the clinic.   BELOW ARE SYMPTOMS THAT SHOULD BE REPORTED IMMEDIATELY:  *FEVER GREATER THAN 100.5 F  *CHILLS WITH OR WITHOUT FEVER  NAUSEA AND VOMITING THAT IS NOT CONTROLLED WITH YOUR NAUSEA MEDICATION  *UNUSUAL SHORTNESS OF BREATH  *UNUSUAL BRUISING OR BLEEDING  TENDERNESS IN MOUTH AND THROAT WITH OR WITHOUT PRESENCE OF ULCERS  *URINARY PROBLEMS  *BOWEL PROBLEMS  UNUSUAL RASH Items with * indicate a potential emergency and should be followed up as soon as possible.  Feel free to call the clinic you have any questions or concerns. The clinic phone number is [phone].  Please show the CHEMO ALERT CARD at check-in to the Emergency Department and triage nurse.    

## 2015-02-19 NOTE — Assessment & Plan Note (Signed)
This is likely due to recent treatment. The patient denies recent history of bleeding such as epistaxis, hematuria or hematochezia. She is asymptomatic from the anemia. I will observe for now.   

## 2015-02-19 NOTE — Assessment & Plan Note (Signed)
The patient has completely recovered from recent side effects of treatment. Will proceed with treatment today without dose adjustment. Her most recent M spike show she is in very good partial response. She is due for Zometa today.

## 2015-02-24 ENCOUNTER — Telehealth: Payer: Self-pay | Admitting: Hematology and Oncology

## 2015-02-24 NOTE — Telephone Encounter (Signed)
returned call and lvm for on work and cell confirming 3.25 appt cx per pof

## 2015-02-26 ENCOUNTER — Ambulatory Visit: Payer: PRIVATE HEALTH INSURANCE

## 2015-03-05 ENCOUNTER — Other Ambulatory Visit: Payer: Self-pay | Admitting: Hematology and Oncology

## 2015-03-05 ENCOUNTER — Other Ambulatory Visit: Payer: Self-pay | Admitting: *Deleted

## 2015-03-05 ENCOUNTER — Ambulatory Visit (HOSPITAL_BASED_OUTPATIENT_CLINIC_OR_DEPARTMENT_OTHER): Payer: PRIVATE HEALTH INSURANCE

## 2015-03-05 ENCOUNTER — Other Ambulatory Visit (HOSPITAL_BASED_OUTPATIENT_CLINIC_OR_DEPARTMENT_OTHER): Payer: PRIVATE HEALTH INSURANCE

## 2015-03-05 DIAGNOSIS — Z5112 Encounter for antineoplastic immunotherapy: Secondary | ICD-10-CM

## 2015-03-05 DIAGNOSIS — C9000 Multiple myeloma not having achieved remission: Secondary | ICD-10-CM

## 2015-03-05 LAB — CBC WITH DIFFERENTIAL/PLATELET
BASO%: 0.5 % (ref 0.0–2.0)
BASOS ABS: 0 10*3/uL (ref 0.0–0.1)
EOS ABS: 0.2 10*3/uL (ref 0.0–0.5)
EOS%: 3.7 % (ref 0.0–7.0)
HCT: 35.4 % (ref 34.8–46.6)
HGB: 11.6 g/dL (ref 11.6–15.9)
LYMPH%: 12.6 % — ABNORMAL LOW (ref 14.0–49.7)
MCH: 32.5 pg (ref 25.1–34.0)
MCHC: 32.8 g/dL (ref 31.5–36.0)
MCV: 99.2 fL (ref 79.5–101.0)
MONO#: 0.7 10*3/uL (ref 0.1–0.9)
MONO%: 11.7 % (ref 0.0–14.0)
NEUT#: 4.3 10*3/uL (ref 1.5–6.5)
NEUT%: 71.5 % (ref 38.4–76.8)
PLATELETS: 237 10*3/uL (ref 145–400)
RBC: 3.57 10*6/uL — ABNORMAL LOW (ref 3.70–5.45)
RDW: 12.9 % (ref 11.2–14.5)
WBC: 6 10*3/uL (ref 3.9–10.3)
lymph#: 0.8 10*3/uL — ABNORMAL LOW (ref 0.9–3.3)

## 2015-03-05 LAB — BASIC METABOLIC PANEL (CC13)
ANION GAP: 7 meq/L (ref 3–11)
BUN: 26.1 mg/dL — AB (ref 7.0–26.0)
CALCIUM: 9.2 mg/dL (ref 8.4–10.4)
CHLORIDE: 106 meq/L (ref 98–109)
CO2: 27 mEq/L (ref 22–29)
Creatinine: 1 mg/dL (ref 0.6–1.1)
EGFR: 60 mL/min/{1.73_m2} — ABNORMAL LOW (ref >90)
Glucose: 88 mg/dl (ref 70–140)
POTASSIUM: 4 meq/L (ref 3.5–5.1)
Sodium: 140 mEq/L (ref 136–145)

## 2015-03-05 MED ORDER — BORTEZOMIB CHEMO SQ INJECTION 3.5 MG (2.5MG/ML)
1.3000 mg/m2 | Freq: Once | INTRAMUSCULAR | Status: AC
  Administered 2015-03-05: 1.75 mg via SUBCUTANEOUS
  Filled 2015-03-05: qty 1.75

## 2015-03-05 MED ORDER — ONDANSETRON HCL 8 MG PO TABS
ORAL_TABLET | ORAL | Status: AC
  Filled 2015-03-05: qty 1

## 2015-03-05 MED ORDER — ONDANSETRON HCL 8 MG PO TABS
8.0000 mg | ORAL_TABLET | Freq: Once | ORAL | Status: AC
  Administered 2015-03-05: 8 mg via ORAL

## 2015-03-05 NOTE — Patient Instructions (Signed)
[facility] Discharge Instructions for Patients Receiving Chemotherapy  Today you received the following chemotherapy agents Velcade. To help prevent nausea and vomiting after your treatment, we encourage you to take your nausea medication as directed.  If you develop nausea and vomiting that is not controlled by your nausea medication, call the clinic.   BELOW ARE SYMPTOMS THAT SHOULD BE REPORTED IMMEDIATELY:  *FEVER GREATER THAN 100.5 F  *CHILLS WITH OR WITHOUT FEVER  NAUSEA AND VOMITING THAT IS NOT CONTROLLED WITH YOUR NAUSEA MEDICATION  *UNUSUAL SHORTNESS OF BREATH  *UNUSUAL BRUISING OR BLEEDING  TENDERNESS IN MOUTH AND THROAT WITH OR WITHOUT PRESENCE OF ULCERS  *URINARY PROBLEMS  *BOWEL PROBLEMS  UNUSUAL RASH Items with * indicate a potential emergency and should be followed up as soon as possible.  Feel free to call the clinic you have any questions or concerns. The clinic phone number is [phone].  Please show the CHEMO ALERT CARD at check-in to the Emergency Department and triage nurse.    

## 2015-03-05 NOTE — Progress Notes (Signed)
Ok to proceed with CBC/BMP per MD Alvy Bimler

## 2015-03-06 ENCOUNTER — Other Ambulatory Visit: Payer: Self-pay | Admitting: Hematology and Oncology

## 2015-03-19 ENCOUNTER — Other Ambulatory Visit (HOSPITAL_BASED_OUTPATIENT_CLINIC_OR_DEPARTMENT_OTHER): Payer: PRIVATE HEALTH INSURANCE

## 2015-03-19 ENCOUNTER — Ambulatory Visit (HOSPITAL_BASED_OUTPATIENT_CLINIC_OR_DEPARTMENT_OTHER): Payer: PRIVATE HEALTH INSURANCE

## 2015-03-19 VITALS — BP 129/78 | HR 84 | Temp 99.1°F | Resp 16

## 2015-03-19 DIAGNOSIS — R748 Abnormal levels of other serum enzymes: Secondary | ICD-10-CM

## 2015-03-19 DIAGNOSIS — C9000 Multiple myeloma not having achieved remission: Secondary | ICD-10-CM | POA: Diagnosis not present

## 2015-03-19 DIAGNOSIS — Z5112 Encounter for antineoplastic immunotherapy: Secondary | ICD-10-CM | POA: Diagnosis not present

## 2015-03-19 DIAGNOSIS — D630 Anemia in neoplastic disease: Secondary | ICD-10-CM

## 2015-03-19 LAB — COMPREHENSIVE METABOLIC PANEL (CC13)
ALBUMIN: 3.9 g/dL (ref 3.5–5.0)
ALT: 21 U/L (ref 0–55)
ANION GAP: 12 meq/L — AB (ref 3–11)
AST: 20 U/L (ref 5–34)
Alkaline Phosphatase: 82 U/L (ref 40–150)
BUN: 17.3 mg/dL (ref 7.0–26.0)
CALCIUM: 8.2 mg/dL — AB (ref 8.4–10.4)
CHLORIDE: 103 meq/L (ref 98–109)
CO2: 26 mEq/L (ref 22–29)
Creatinine: 1 mg/dL (ref 0.6–1.1)
EGFR: 60 mL/min/{1.73_m2} — AB (ref >90)
GLUCOSE: 111 mg/dL (ref 70–140)
Potassium: 3.9 mEq/L (ref 3.5–5.1)
Sodium: 141 mEq/L (ref 136–145)
Total Bilirubin: 0.3 mg/dL (ref 0.20–1.20)
Total Protein: 6.7 g/dL (ref 6.4–8.3)

## 2015-03-19 LAB — CBC WITH DIFFERENTIAL/PLATELET
BASO%: 1.1 % (ref 0.0–2.0)
Basophils Absolute: 0.1 10*3/uL (ref 0.0–0.1)
EOS%: 1.1 % (ref 0.0–7.0)
Eosinophils Absolute: 0.1 10*3/uL (ref 0.0–0.5)
HCT: 37.8 % (ref 34.8–46.6)
HGB: 12.2 g/dL (ref 11.6–15.9)
LYMPH%: 17.4 % (ref 14.0–49.7)
MCH: 31 pg (ref 25.1–34.0)
MCHC: 32.2 g/dL (ref 31.5–36.0)
MCV: 96.2 fL (ref 79.5–101.0)
MONO#: 0.4 10*3/uL (ref 0.1–0.9)
MONO%: 8.2 % (ref 0.0–14.0)
NEUT#: 3.8 10*3/uL (ref 1.5–6.5)
NEUT%: 72.2 % (ref 38.4–76.8)
Platelets: 289 10*3/uL (ref 145–400)
RBC: 3.93 10*6/uL (ref 3.70–5.45)
RDW: 12.5 % (ref 11.2–14.5)
WBC: 5.3 10*3/uL (ref 3.9–10.3)
lymph#: 0.9 10*3/uL (ref 0.9–3.3)

## 2015-03-19 MED ORDER — BORTEZOMIB CHEMO SQ INJECTION 3.5 MG (2.5MG/ML)
1.3000 mg/m2 | Freq: Once | INTRAMUSCULAR | Status: AC
  Administered 2015-03-19: 1.75 mg via SUBCUTANEOUS
  Filled 2015-03-19: qty 1.75

## 2015-03-19 MED ORDER — ONDANSETRON HCL 8 MG PO TABS
8.0000 mg | ORAL_TABLET | Freq: Once | ORAL | Status: AC
  Administered 2015-03-19: 8 mg via ORAL

## 2015-03-19 MED ORDER — ONDANSETRON HCL 8 MG PO TABS
ORAL_TABLET | ORAL | Status: AC
  Filled 2015-03-19: qty 1

## 2015-03-23 LAB — SPEP & IFE WITH QIG
ALPHA-2-GLOBULIN: 0.9 g/dL (ref 0.5–0.9)
Abnormal Protein Band1: 0.2 g/dL
Albumin ELP: 4.1 g/dL (ref 3.8–4.8)
Alpha-1-Globulin: 0.3 g/dL (ref 0.2–0.3)
Beta 2: 0.5 g/dL (ref 0.2–0.5)
Beta Globulin: 0.4 g/dL (ref 0.4–0.6)
Gamma Globulin: 0.4 g/dL — ABNORMAL LOW (ref 0.8–1.7)
IgA: 165 mg/dL (ref 69–380)
IgG (Immunoglobin G), Serum: 425 mg/dL — ABNORMAL LOW (ref 690–1700)
IgM, Serum: 118 mg/dL (ref 52–322)
TOTAL PROTEIN, SERUM ELECTROPHOR: 6.4 g/dL (ref 6.1–8.1)

## 2015-03-23 LAB — KAPPA/LAMBDA LIGHT CHAINS
Kappa free light chain: 19.3 mg/dL — ABNORMAL HIGH (ref 0.33–1.94)
Kappa:Lambda Ratio: 18.04 — ABNORMAL HIGH (ref 0.26–1.65)
Lambda Free Lght Chn: 1.07 mg/dL (ref 0.57–2.63)

## 2015-04-02 ENCOUNTER — Other Ambulatory Visit (HOSPITAL_BASED_OUTPATIENT_CLINIC_OR_DEPARTMENT_OTHER): Payer: PRIVATE HEALTH INSURANCE

## 2015-04-02 ENCOUNTER — Encounter: Payer: Self-pay | Admitting: Hematology and Oncology

## 2015-04-02 ENCOUNTER — Ambulatory Visit (HOSPITAL_BASED_OUTPATIENT_CLINIC_OR_DEPARTMENT_OTHER): Payer: PRIVATE HEALTH INSURANCE | Admitting: Hematology and Oncology

## 2015-04-02 ENCOUNTER — Ambulatory Visit (HOSPITAL_BASED_OUTPATIENT_CLINIC_OR_DEPARTMENT_OTHER): Payer: PRIVATE HEALTH INSURANCE

## 2015-04-02 ENCOUNTER — Telehealth: Payer: Self-pay | Admitting: Hematology and Oncology

## 2015-04-02 VITALS — BP 149/85 | HR 88 | Temp 98.2°F | Resp 18 | Ht 60.0 in | Wt 108.0 lb

## 2015-04-02 DIAGNOSIS — M898X9 Other specified disorders of bone, unspecified site: Secondary | ICD-10-CM

## 2015-04-02 DIAGNOSIS — C9000 Multiple myeloma not having achieved remission: Secondary | ICD-10-CM

## 2015-04-02 DIAGNOSIS — Z5112 Encounter for antineoplastic immunotherapy: Secondary | ICD-10-CM

## 2015-04-02 DIAGNOSIS — IMO0002 Reserved for concepts with insufficient information to code with codable children: Secondary | ICD-10-CM

## 2015-04-02 LAB — CBC WITH DIFFERENTIAL/PLATELET
BASO%: 0.6 % (ref 0.0–2.0)
BASOS ABS: 0 10*3/uL (ref 0.0–0.1)
EOS%: 1.1 % (ref 0.0–7.0)
Eosinophils Absolute: 0.1 10*3/uL (ref 0.0–0.5)
HEMATOCRIT: 37.5 % (ref 34.8–46.6)
HEMOGLOBIN: 12.4 g/dL (ref 11.6–15.9)
LYMPH%: 19.9 % (ref 14.0–49.7)
MCH: 32 pg (ref 25.1–34.0)
MCHC: 33.1 g/dL (ref 31.5–36.0)
MCV: 96.9 fL (ref 79.5–101.0)
MONO#: 0.5 10*3/uL (ref 0.1–0.9)
MONO%: 8.5 % (ref 0.0–14.0)
NEUT#: 3.8 10*3/uL (ref 1.5–6.5)
NEUT%: 69.9 % (ref 38.4–76.8)
Platelets: 212 10*3/uL (ref 145–400)
RBC: 3.87 10*6/uL (ref 3.70–5.45)
RDW: 12.4 % (ref 11.2–14.5)
WBC: 5.4 10*3/uL (ref 3.9–10.3)
lymph#: 1.1 10*3/uL (ref 0.9–3.3)

## 2015-04-02 LAB — COMPREHENSIVE METABOLIC PANEL (CC13)
ALT: 18 U/L (ref 0–55)
ANION GAP: 10 meq/L (ref 3–11)
AST: 20 U/L (ref 5–34)
Albumin: 4.1 g/dL (ref 3.5–5.0)
Alkaline Phosphatase: 73 U/L (ref 40–150)
BUN: 17.6 mg/dL (ref 7.0–26.0)
CHLORIDE: 104 meq/L (ref 98–109)
CO2: 28 meq/L (ref 22–29)
CREATININE: 1 mg/dL (ref 0.6–1.1)
Calcium: 9.6 mg/dL (ref 8.4–10.4)
EGFR: 58 mL/min/{1.73_m2} — AB (ref >90)
GLUCOSE: 91 mg/dL (ref 70–140)
POTASSIUM: 3.9 meq/L (ref 3.5–5.1)
SODIUM: 142 meq/L (ref 136–145)
Total Bilirubin: 0.36 mg/dL (ref 0.20–1.20)
Total Protein: 6.7 g/dL (ref 6.4–8.3)

## 2015-04-02 MED ORDER — BORTEZOMIB CHEMO SQ INJECTION 3.5 MG (2.5MG/ML)
1.3000 mg/m2 | Freq: Once | INTRAMUSCULAR | Status: AC
  Administered 2015-04-02: 1.75 mg via SUBCUTANEOUS
  Filled 2015-04-02: qty 1.75

## 2015-04-02 MED ORDER — HYDROMORPHONE HCL 2 MG PO TABS: 2.0000 mg | ORAL_TABLET | Freq: Four times a day (QID) | ORAL | Status: DC | PRN

## 2015-04-02 MED ORDER — ONDANSETRON HCL 8 MG PO TABS
8.0000 mg | ORAL_TABLET | Freq: Once | ORAL | Status: AC
  Administered 2015-04-02: 8 mg via ORAL

## 2015-04-02 MED ORDER — ONDANSETRON HCL 8 MG PO TABS
ORAL_TABLET | ORAL | Status: AC
  Filled 2015-04-02: qty 1

## 2015-04-02 NOTE — Assessment & Plan Note (Signed)
She tolerated Velcade very well. I am concerned about mildly worsening free light chain even though the M spike remain about the same. I plan to recheck it again in 6 weeks. If it continues to get worse, I might have to add her back on Revlimid treatment.

## 2015-04-02 NOTE — Assessment & Plan Note (Signed)
This is improving. She is weaning herself off pain medicine I refill prescription Dilaudid today.

## 2015-04-02 NOTE — Assessment & Plan Note (Signed)
She is doing well since kyphoplasty. She will continue calcium, vitamin D and Zometa every 3 months, next Rx due in June

## 2015-04-02 NOTE — Patient Instructions (Signed)
[facility] Discharge Instructions for Patients Receiving Chemotherapy  Today you received the following chemotherapy agents: Velcade.  To help prevent nausea and vomiting after your treatment, we encourage you to take your nausea medication: Zofran. Take one every 8 hours as needed.    If you develop nausea and vomiting that is not controlled by your nausea medication, call the clinic.   BELOW ARE SYMPTOMS THAT SHOULD BE REPORTED IMMEDIATELY:  *FEVER GREATER THAN 100.5 F  *CHILLS WITH OR WITHOUT FEVER  NAUSEA AND VOMITING THAT IS NOT CONTROLLED WITH YOUR NAUSEA MEDICATION  *UNUSUAL SHORTNESS OF BREATH  *UNUSUAL BRUISING OR BLEEDING  TENDERNESS IN MOUTH AND THROAT WITH OR WITHOUT PRESENCE OF ULCERS  *URINARY PROBLEMS  *BOWEL PROBLEMS  UNUSUAL RASH Items with * indicate a potential emergency and should be followed up as soon as possible.  Feel free to call the clinic should you have any questions or concerns. The clinic phone number is [phone].  Please show the CHEMO ALERT CARD at check-in to the Emergency Department and triage nurse.   

## 2015-04-02 NOTE — Telephone Encounter (Signed)
Pt confirmed labs/ov per 04/29 POF, gave pt AVS and Calendar.... KJ, sent msg to add chemo

## 2015-04-02 NOTE — Progress Notes (Signed)
[facility] OFFICE PROGRESS NOTE  Patient Care Team: Hulan Fess, MD as PCP - General (Family Medicine) Luanne Bras, MD as Consulting Physician (Interventional Radiology)  SUMMARY OF ONCOLOGIC HISTORY: Oncology History   Multiple myeloma without remission; Calcium 13.3, Creatinine 2.83, Hg 8.3, Bone lesions present, Beta 2 microglobulin 12.8, Albumin 3.5, IgA 1760 and kappa light chains 1670. Bone marrow biopsy showed 82% involvement.   Primary site: Multiple Myeloma   Staging method: AJCC 6th Edition   Clinical: Stage IIIB signed by Heath Lark, MD on 07/13/2014 12:38 PM   Summary: Stage IIIB       Multiple myeloma without remission   07/07/2014 - 07/10/2014 Hospital Admission She was admitted to the hospital for management of malignant hypercalcemia and had bone marrow biopsy which confirmed diagnosis of multiple myeloma. The patient received one unit of blood transfusion due to severe anemia.   07/08/2014 Bone Marrow Biopsy The patient had bone marrow biopsy that confirmed diagnosis.   07/08/2014 Pathology Results Accession: [id number] showed 82% involvement of bone marrow.   07/08/2014 - 10/19/2014 Chemotherapy She was started on Velcade along with dexamethasone.   08/21/2014 - 10/19/2014 Chemotherapy I added Revlimid and Zometa to her chemotherapy regimen.   09/24/2014 Pathology Results Accession: [id number] T12 biopsy showed plasma cells involvement.   09/24/2014 Procedure she has kyphoplasty performed.   09/30/2014 Bone Marrow Biopsy Accession: [id number] bone marrow biopsy showed residual plasma cell, approximately 11%   11/06/2014 -  Chemotherapy She was started on Velcade maintenance every other week    INTERVAL HISTORY: Please see below for problem oriented charting.. She is sitting here today prior to her next treatment of chemotherapy. She feels well. She is attempting to wean herself off pain medicine. She continues to take 1-2 tablets per day for back pain. She  denies recent infection. Denies new neuropathy.  REVIEW OF SYSTEMS:   Constitutional: Denies fevers, chills or abnormal weight loss Eyes: Denies blurriness of vision Ears, nose, mouth, throat, and face: Denies mucositis or sore throat Respiratory: Denies cough, dyspnea or wheezes Cardiovascular: Denies palpitation, chest discomfort or lower extremity swelling Gastrointestinal:  Denies nausea, heartburn or change in bowel habits Skin: Denies abnormal skin rashes Lymphatics: Denies new lymphadenopathy or easy bruising Neurological:Denies numbness, tingling or new weaknesses Behavioral/Psych: Mood is stable, no new changes  All other systems were reviewed with the patient and are negative.  I have reviewed the past medical history, past surgical history, social history and family history with the patient and they are unchanged from previous note.  ALLERGIES:  is allergic to hydrocodone; ace inhibitors; and cephalexin.  MEDICATIONS:  Current Outpatient Prescriptions  Medication Sig Dispense Refill  . acyclovir (ZOVIRAX) 400 MG tablet TAKE 1 TABLET BY MOUTH EVERY DAY 30 tablet 5  . amLODipine (NORVASC) 10 MG tablet TAKE 1 TABLET BY MOUTH EVERY DAY 30 tablet 0  . Calcium-Phosphorus-Vitamin D (CITRACAL +D3 PO) Take 1 tablet by mouth daily. 500 u-$RemoveBefo'400mg'HaYILMibjSY$     . cholecalciferol (VITAMIN D) 1000 UNITS tablet Take 2,000 Units by mouth every morning.     . [employer] SENNA PLUS 8.6-50 MG per tablet TAKE 2 TABLETS BY MOUTH TWICE A DAY 60 tablet 1  . HYDROmorphone (DILAUDID) 2 MG tablet Take 1 tablet (2 mg total) by mouth every 6 (six) hours as needed for severe pain. 90 tablet 0  . ondansetron (ZOFRAN) 8 MG tablet Take 1 tablet (8 mg total) by mouth every 8 (eight) hours as needed for nausea. 60 tablet 3  .  polyvinyl alcohol (LIQUIFILM TEARS) 1.4 % ophthalmic solution Place 1 drop into both eyes as needed for dry eyes.     Marland Kitchen dexamethasone (DECADRON) 4 MG tablet Take 20 mg by mouth once a week. On Monday.     Marland Kitchen HYDROcodone-homatropine (HYCODAN) 5-1.5 MG/5ML syrup Take 5 mLs by mouth every 6 (six) hours as needed for cough. (Patient not taking: Reported on 04/02/2015) 473 mL 0   No current facility-administered medications for this visit.   Facility-Administered Medications Ordered in Other Visits  Medication Dose Route Frequency Provider Last Rate Last Dose  . bortezomib SQ (VELCADE) chemo injection 1.75 mg  1.3 mg/m2 (Treatment Plan Actual) Subcutaneous Once Artis Delay, MD        PHYSICAL EXAMINATION: ECOG PERFORMANCE STATUS: 0 - Asymptomatic  Filed Vitals:   04/02/15 1346  BP: 149/85  Pulse: 88  Temp: 98.2 F (36.8 C)  Resp: 18   Filed Weights   04/02/15 1346  Weight: 108 lb (48.988 kg)    GENERAL:alert, no distress and comfortable SKIN: skin color, texture, turgor are normal, no rashes or significant lesions EYES: normal, Conjunctiva are pink and non-injected, sclera clear OROPHARYNX:no exudate, no erythema and lips, buccal mucosa, and tongue normal  NECK: supple, thyroid normal size, non-tender, without nodularity LYMPH:  no palpable lymphadenopathy in the cervical, axillary or inguinal LUNGS: clear to auscultation and percussion with normal breathing effort HEART: regular rate & rhythm and no murmurs and no lower extremity edema ABDOMEN:abdomen soft, non-tender and normal bowel sounds Musculoskeletal:no cyanosis of digits and no clubbing  NEURO: alert & oriented x 3 with fluent speech, no focal motor/sensory deficits  LABORATORY DATA:  I have reviewed the data as listed    Component Value Date/Time   NA 142 04/02/2015 1325   NA 143 09/24/2014 0722   K 3.9 04/02/2015 1325   K 3.8 09/24/2014 0722   CL 105 09/24/2014 0722   CO2 28 04/02/2015 1325   CO2 27 09/24/2014 0722   GLUCOSE 91 04/02/2015 1325   GLUCOSE 80 09/24/2014 0722   BUN 17.6 04/02/2015 1325   BUN 17 09/24/2014 0722   CREATININE 1.0 04/02/2015 1325   CREATININE 1.22* 09/24/2014 0722   CALCIUM 9.6  04/02/2015 1325   CALCIUM 8.6 09/24/2014 0722   PROT 6.7 04/02/2015 1325   PROT 7.2 07/13/2014 1222   ALBUMIN 4.1 04/02/2015 1325   ALBUMIN 3.9 07/13/2014 1222   AST 20 04/02/2015 1325   AST 22 07/13/2014 1222   ALT 18 04/02/2015 1325   ALT 75* 07/13/2014 1222   ALKPHOS 73 04/02/2015 1325   ALKPHOS 90 07/13/2014 1222   BILITOT 0.36 04/02/2015 1325   BILITOT 0.4 07/13/2014 1222   GFRNONAA 47* 09/24/2014 0722   GFRAA 54* 09/24/2014 0722    No results found for: SPEP, UPEP  Lab Results  Component Value Date   WBC 5.4 04/02/2015   NEUTROABS 3.8 04/02/2015   HGB 12.4 04/02/2015   HCT 37.5 04/02/2015   MCV 96.9 04/02/2015   PLT 212 04/02/2015      Chemistry      Component Value Date/Time   NA 142 04/02/2015 1325   NA 143 09/24/2014 0722   K 3.9 04/02/2015 1325   K 3.8 09/24/2014 0722   CL 105 09/24/2014 0722   CO2 28 04/02/2015 1325   CO2 27 09/24/2014 0722   BUN 17.6 04/02/2015 1325   BUN 17 09/24/2014 0722   CREATININE 1.0 04/02/2015 1325   CREATININE 1.22* 09/24/2014 3796  Component Value Date/Time   CALCIUM 9.6 04/02/2015 1325   CALCIUM 8.6 09/24/2014 0722   ALKPHOS 73 04/02/2015 1325   ALKPHOS 90 07/13/2014 1222   AST 20 04/02/2015 1325   AST 22 07/13/2014 1222   ALT 18 04/02/2015 1325   ALT 75* 07/13/2014 1222   BILITOT 0.36 04/02/2015 1325   BILITOT 0.4 07/13/2014 1222     ASSESSMENT & PLAN:  Multiple myeloma without remission She tolerated Velcade very well. I am concerned about mildly worsening free light chain even though the M spike remain about the same. I plan to recheck it again in 6 weeks. If it continues to get worse, I might have to add her back on Revlimid treatment.   Bone pain This is improving. She is weaning herself off pain medicine I refill prescription Dilaudid today.      Compression fracture She is doing well since kyphoplasty. She will continue calcium, vitamin D and Zometa every 3 months, next Rx due in  June      Orders Placed This Encounter  Procedures  . SPEP & IFE with QIG    Standing Status: Future     Number of Occurrences:      Standing Expiration Date: 05/06/2016  . Kappa/lambda light chains    Standing Status: Future     Number of Occurrences:      Standing Expiration Date: 05/06/2016  . Beta 2 microglobulin, serum    Standing Status: Future     Number of Occurrences:      Standing Expiration Date: 05/06/2016   All questions were answered. The patient knows to call the clinic with any problems, questions or concerns. No barriers to learning was detected. I spent 25 minutes counseling the patient face to face. The total time spent in the appointment was 30 minutes and more than 50% was on counseling and review of test results     [facility], [place], MD 04/02/2015 2:45 PM

## 2015-04-05 ENCOUNTER — Other Ambulatory Visit: Payer: Self-pay | Admitting: Hematology and Oncology

## 2015-04-05 ENCOUNTER — Telehealth: Payer: Self-pay | Admitting: Hematology and Oncology

## 2015-04-05 NOTE — Telephone Encounter (Signed)
Chemo scheduled per staff message °

## 2015-04-16 ENCOUNTER — Other Ambulatory Visit (HOSPITAL_BASED_OUTPATIENT_CLINIC_OR_DEPARTMENT_OTHER): Payer: PRIVATE HEALTH INSURANCE

## 2015-04-16 ENCOUNTER — Ambulatory Visit (HOSPITAL_BASED_OUTPATIENT_CLINIC_OR_DEPARTMENT_OTHER): Payer: PRIVATE HEALTH INSURANCE

## 2015-04-16 VITALS — BP 145/85 | HR 86 | Temp 98.8°F | Resp 18

## 2015-04-16 DIAGNOSIS — C9000 Multiple myeloma not having achieved remission: Secondary | ICD-10-CM

## 2015-04-16 DIAGNOSIS — Z5112 Encounter for antineoplastic immunotherapy: Secondary | ICD-10-CM

## 2015-04-16 LAB — CBC WITH DIFFERENTIAL/PLATELET
BASO%: 0.9 % (ref 0.0–2.0)
Basophils Absolute: 0 10*3/uL (ref 0.0–0.1)
EOS ABS: 0.1 10*3/uL (ref 0.0–0.5)
EOS%: 1.5 % (ref 0.0–7.0)
HCT: 36.3 % (ref 34.8–46.6)
HGB: 12 g/dL (ref 11.6–15.9)
LYMPH%: 20.3 % (ref 14.0–49.7)
MCH: 31.1 pg (ref 25.1–34.0)
MCHC: 33 g/dL (ref 31.5–36.0)
MCV: 94.3 fL (ref 79.5–101.0)
MONO#: 0.5 10*3/uL (ref 0.1–0.9)
MONO%: 8.8 % (ref 0.0–14.0)
NEUT%: 68.5 % (ref 38.4–76.8)
NEUTROS ABS: 3.7 10*3/uL (ref 1.5–6.5)
Platelets: 243 10*3/uL (ref 145–400)
RBC: 3.84 10*6/uL (ref 3.70–5.45)
RDW: 12.6 % (ref 11.2–14.5)
WBC: 5.5 10*3/uL (ref 3.9–10.3)
lymph#: 1.1 10*3/uL (ref 0.9–3.3)

## 2015-04-16 LAB — COMPREHENSIVE METABOLIC PANEL (CC13)
ALT: 15 U/L (ref 0–55)
AST: 15 U/L (ref 5–34)
Albumin: 3.9 g/dL (ref 3.5–5.0)
Alkaline Phosphatase: 66 U/L (ref 40–150)
Anion Gap: 9 mEq/L (ref 3–11)
BUN: 20.9 mg/dL (ref 7.0–26.0)
CO2: 26 mEq/L (ref 22–29)
Calcium: 9.1 mg/dL (ref 8.4–10.4)
Chloride: 107 mEq/L (ref 98–109)
Creatinine: 0.9 mg/dL (ref 0.6–1.1)
EGFR: 65 mL/min/{1.73_m2} — AB (ref >90)
Glucose: 92 mg/dl (ref 70–140)
POTASSIUM: 4.1 meq/L (ref 3.5–5.1)
Sodium: 143 mEq/L (ref 136–145)
TOTAL PROTEIN: 6.4 g/dL (ref 6.4–8.3)
Total Bilirubin: <0.2 mg/dL (ref 0.20–1.20)

## 2015-04-16 MED ORDER — ONDANSETRON HCL 8 MG PO TABS
ORAL_TABLET | ORAL | Status: AC
  Filled 2015-04-16: qty 1

## 2015-04-16 MED ORDER — BORTEZOMIB CHEMO SQ INJECTION 3.5 MG (2.5MG/ML)
1.3000 mg/m2 | Freq: Once | INTRAMUSCULAR | Status: AC
  Administered 2015-04-16: 1.75 mg via SUBCUTANEOUS
  Filled 2015-04-16: qty 1.75

## 2015-04-16 MED ORDER — ONDANSETRON HCL 8 MG PO TABS
8.0000 mg | ORAL_TABLET | Freq: Once | ORAL | Status: AC
  Administered 2015-04-16: 8 mg via ORAL

## 2015-04-16 NOTE — Patient Instructions (Signed)
[facility] Discharge Instructions for Patients Receiving Chemotherapy  Today you received the following chemotherapy agents VELCADE  To help prevent nausea and vomiting after your treatment, we encourage you to take your nausea medication as prescribed.   If you develop nausea and vomiting that is not controlled by your nausea medication, call the clinic.   BELOW ARE SYMPTOMS THAT SHOULD BE REPORTED IMMEDIATELY:  *FEVER GREATER THAN 100.5 F  *CHILLS WITH OR WITHOUT FEVER  NAUSEA AND VOMITING THAT IS NOT CONTROLLED WITH YOUR NAUSEA MEDICATION  *UNUSUAL SHORTNESS OF BREATH  *UNUSUAL BRUISING OR BLEEDING  TENDERNESS IN MOUTH AND THROAT WITH OR WITHOUT PRESENCE OF ULCERS  *URINARY PROBLEMS  *BOWEL PROBLEMS  UNUSUAL RASH Items with * indicate a potential emergency and should be followed up as soon as possible.  Feel free to call the clinic you have any questions or concerns. The clinic phone number is [phone].  Please show the CHEMO ALERT CARD at check-in to the Emergency Department and triage nurse.   

## 2015-04-16 NOTE — Progress Notes (Signed)
Pt refused IV fluids today . Dr Alvy Bimler notified.

## 2015-04-19 ENCOUNTER — Telehealth: Payer: Self-pay | Admitting: *Deleted

## 2015-04-19 NOTE — Telephone Encounter (Signed)
-----   Message from Heath Lark, MD sent at 04/16/2015  4:45 PM EDT ----- Regarding: back pain Cameo, please call her next week If it is worse, we might have to repeat X rays ----- Message -----    From: Ardeen Garland, RN    Sent: 04/16/2015   4:30 PM      To: Heath Lark, MD  FYI-she did not want ivf today -does not think she needs them . I gave her labs results to her.  Back pain increasing and she is now taking steroid twice a week. Ankle pain increasing especially with pressure . She notices when she is in bed at night.

## 2015-04-19 NOTE — Telephone Encounter (Signed)
Pt reports her back pain has improved over the weekend.  She thinks she exacerbated it by doing some lifting of some files last week that "I shouldn't have been doing."  She was experiencing some burning, stinging pain in her back for several days afterwards, but it has subsided now.   She does have ongoing pain in left ankle mostly when trying to sleep at night.  She has some swelling in bilat feet but worse in left foot intermittently.  The swelling does go down if she elevates her feet at night.   States she will continue to monitor her pain and swelling. Will let us know if anything gets worse or changes.  She understands Dr. Alvy Bimler will order xray if back pain worsens again.

## 2015-04-19 NOTE — Telephone Encounter (Signed)
Left VM for pt to return nurse's call regarding messages below.

## 2015-04-29 ENCOUNTER — Other Ambulatory Visit: Payer: Self-pay | Admitting: *Deleted

## 2015-04-29 DIAGNOSIS — C9000 Multiple myeloma not having achieved remission: Secondary | ICD-10-CM

## 2015-04-30 ENCOUNTER — Other Ambulatory Visit (HOSPITAL_BASED_OUTPATIENT_CLINIC_OR_DEPARTMENT_OTHER): Payer: PRIVATE HEALTH INSURANCE

## 2015-04-30 ENCOUNTER — Ambulatory Visit (HOSPITAL_BASED_OUTPATIENT_CLINIC_OR_DEPARTMENT_OTHER): Payer: PRIVATE HEALTH INSURANCE

## 2015-04-30 VITALS — BP 148/72 | HR 85 | Temp 97.7°F | Resp 19

## 2015-04-30 DIAGNOSIS — D630 Anemia in neoplastic disease: Secondary | ICD-10-CM

## 2015-04-30 DIAGNOSIS — R748 Abnormal levels of other serum enzymes: Secondary | ICD-10-CM

## 2015-04-30 DIAGNOSIS — Z5112 Encounter for antineoplastic immunotherapy: Secondary | ICD-10-CM | POA: Diagnosis not present

## 2015-04-30 DIAGNOSIS — C9000 Multiple myeloma not having achieved remission: Secondary | ICD-10-CM

## 2015-04-30 LAB — CBC WITH DIFFERENTIAL/PLATELET
BASO%: 0.6 % (ref 0.0–2.0)
Basophils Absolute: 0 10*3/uL (ref 0.0–0.1)
EOS%: 1.3 % (ref 0.0–7.0)
Eosinophils Absolute: 0.1 10*3/uL (ref 0.0–0.5)
HCT: 37.3 % (ref 34.8–46.6)
HGB: 12.4 g/dL (ref 11.6–15.9)
LYMPH#: 1 10*3/uL (ref 0.9–3.3)
LYMPH%: 17.8 % (ref 14.0–49.7)
MCH: 31.3 pg (ref 25.1–34.0)
MCHC: 33.2 g/dL (ref 31.5–36.0)
MCV: 94.2 fL (ref 79.5–101.0)
MONO#: 0.5 10*3/uL (ref 0.1–0.9)
MONO%: 8.5 % (ref 0.0–14.0)
NEUT#: 4 10*3/uL (ref 1.5–6.5)
NEUT%: 71.8 % (ref 38.4–76.8)
PLATELETS: 240 10*3/uL (ref 145–400)
RBC: 3.96 10*6/uL (ref 3.70–5.45)
RDW: 12.3 % (ref 11.2–14.5)
WBC: 5.6 10*3/uL (ref 3.9–10.3)

## 2015-04-30 LAB — COMPREHENSIVE METABOLIC PANEL (CC13)
ALBUMIN: 4 g/dL (ref 3.5–5.0)
ALT: 14 U/L (ref 0–55)
ANION GAP: 12 meq/L — AB (ref 3–11)
AST: 22 U/L (ref 5–34)
Alkaline Phosphatase: 63 U/L (ref 40–150)
BILIRUBIN TOTAL: 0.3 mg/dL (ref 0.20–1.20)
BUN: 18.4 mg/dL (ref 7.0–26.0)
CO2: 29 meq/L (ref 22–29)
CREATININE: 1.3 mg/dL — AB (ref 0.6–1.1)
Calcium: 9.4 mg/dL (ref 8.4–10.4)
Chloride: 104 mEq/L (ref 98–109)
EGFR: 44 mL/min/{1.73_m2} — ABNORMAL LOW (ref >90)
GLUCOSE: 81 mg/dL (ref 70–140)
POTASSIUM: 3.8 meq/L (ref 3.5–5.1)
Sodium: 145 mEq/L (ref 136–145)
TOTAL PROTEIN: 6.7 g/dL (ref 6.4–8.3)

## 2015-04-30 MED ORDER — BORTEZOMIB CHEMO SQ INJECTION 3.5 MG (2.5MG/ML)
1.3000 mg/m2 | Freq: Once | INTRAMUSCULAR | Status: AC
  Administered 2015-04-30: 1.75 mg via SUBCUTANEOUS
  Filled 2015-04-30: qty 1.75

## 2015-04-30 MED ORDER — ONDANSETRON HCL 8 MG PO TABS
8.0000 mg | ORAL_TABLET | Freq: Once | ORAL | Status: AC
  Administered 2015-04-30: 8 mg via ORAL

## 2015-04-30 MED ORDER — ONDANSETRON HCL 8 MG PO TABS
ORAL_TABLET | ORAL | Status: AC
  Filled 2015-04-30: qty 1

## 2015-04-30 NOTE — Progress Notes (Signed)
Pt declining fluids today. Encouraged to continue increasing fluids; pt voices understanding.

## 2015-04-30 NOTE — Patient Instructions (Signed)
[facility] Discharge Instructions for Patients Receiving Chemotherapy  Today you received the following chemotherapy agents: Velcade  To help prevent nausea and vomiting after your treatment, we encourage you to take your nausea medication as prescribed by your physician.   If you develop nausea and vomiting that is not controlled by your nausea medication, call the clinic.   BELOW ARE SYMPTOMS THAT SHOULD BE REPORTED IMMEDIATELY:  *FEVER GREATER THAN 100.5 F  *CHILLS WITH OR WITHOUT FEVER  NAUSEA AND VOMITING THAT IS NOT CONTROLLED WITH YOUR NAUSEA MEDICATION  *UNUSUAL SHORTNESS OF BREATH  *UNUSUAL BRUISING OR BLEEDING  TENDERNESS IN MOUTH AND THROAT WITH OR WITHOUT PRESENCE OF ULCERS  *URINARY PROBLEMS  *BOWEL PROBLEMS  UNUSUAL RASH Items with * indicate a potential emergency and should be followed up as soon as possible.  Feel free to call the clinic you have any questions or concerns. The clinic phone number is [phone].  Please show the CHEMO ALERT CARD at check-in to the Emergency Department and triage nurse.   

## 2015-05-05 ENCOUNTER — Other Ambulatory Visit: Payer: Self-pay | Admitting: Hematology and Oncology

## 2015-05-05 LAB — SPEP & IFE WITH QIG
ABNORMAL PROTEIN BAND1: 0.2 g/dL
Albumin ELP: 4.1 g/dL (ref 3.8–4.8)
Alpha-1-Globulin: 0.3 g/dL (ref 0.2–0.3)
Alpha-2-Globulin: 0.8 g/dL (ref 0.5–0.9)
BETA GLOBULIN: 0.4 g/dL (ref 0.4–0.6)
Beta 2: 0.4 g/dL (ref 0.2–0.5)
Gamma Globulin: 0.4 g/dL — ABNORMAL LOW (ref 0.8–1.7)
IGM, SERUM: 107 mg/dL (ref 52–322)
IgA: 169 mg/dL (ref 69–380)
IgG (Immunoglobin G), Serum: 465 mg/dL — ABNORMAL LOW (ref 690–1700)
TOTAL PROTEIN, SERUM ELECTROPHOR: 6.3 g/dL (ref 6.1–8.1)

## 2015-05-05 LAB — KAPPA/LAMBDA LIGHT CHAINS
KAPPA FREE LGHT CHN: 17.5 mg/dL — AB (ref 0.33–1.94)
KAPPA LAMBDA RATIO: 17.16 — AB (ref 0.26–1.65)
Lambda Free Lght Chn: 1.02 mg/dL (ref 0.57–2.63)

## 2015-05-05 LAB — BETA 2 MICROGLOBULIN, SERUM: Beta-2 Microglobulin: 2.64 mg/L — ABNORMAL HIGH (ref <=2.51)

## 2015-05-14 ENCOUNTER — Other Ambulatory Visit (HOSPITAL_BASED_OUTPATIENT_CLINIC_OR_DEPARTMENT_OTHER): Payer: PRIVATE HEALTH INSURANCE

## 2015-05-14 ENCOUNTER — Ambulatory Visit (HOSPITAL_BASED_OUTPATIENT_CLINIC_OR_DEPARTMENT_OTHER): Payer: PRIVATE HEALTH INSURANCE | Admitting: Hematology and Oncology

## 2015-05-14 ENCOUNTER — Ambulatory Visit (HOSPITAL_BASED_OUTPATIENT_CLINIC_OR_DEPARTMENT_OTHER): Payer: PRIVATE HEALTH INSURANCE

## 2015-05-14 ENCOUNTER — Encounter: Payer: Self-pay | Admitting: Hematology and Oncology

## 2015-05-14 ENCOUNTER — Telehealth: Payer: Self-pay | Admitting: Hematology and Oncology

## 2015-05-14 VITALS — BP 135/81 | HR 90 | Temp 98.2°F | Resp 18 | Ht 60.0 in | Wt 108.1 lb

## 2015-05-14 DIAGNOSIS — M898X9 Other specified disorders of bone, unspecified site: Secondary | ICD-10-CM | POA: Diagnosis not present

## 2015-05-14 DIAGNOSIS — C9000 Multiple myeloma not having achieved remission: Secondary | ICD-10-CM

## 2015-05-14 DIAGNOSIS — G620 Drug-induced polyneuropathy: Secondary | ICD-10-CM | POA: Diagnosis not present

## 2015-05-14 DIAGNOSIS — T451X5A Adverse effect of antineoplastic and immunosuppressive drugs, initial encounter: Secondary | ICD-10-CM

## 2015-05-14 LAB — CBC WITH DIFFERENTIAL/PLATELET
BASO%: 0.2 % (ref 0.0–2.0)
BASOS ABS: 0 10*3/uL (ref 0.0–0.1)
EOS%: 0.6 % (ref 0.0–7.0)
Eosinophils Absolute: 0 10*3/uL (ref 0.0–0.5)
HCT: 35.9 % (ref 34.8–46.6)
HGB: 11.9 g/dL (ref 11.6–15.9)
LYMPH#: 0.9 10*3/uL (ref 0.9–3.3)
LYMPH%: 16.7 % (ref 14.0–49.7)
MCH: 31.3 pg (ref 25.1–34.0)
MCHC: 33.1 g/dL (ref 31.5–36.0)
MCV: 94.5 fL (ref 79.5–101.0)
MONO#: 0.5 10*3/uL (ref 0.1–0.9)
MONO%: 8.5 % (ref 0.0–14.0)
NEUT#: 3.9 10*3/uL (ref 1.5–6.5)
NEUT%: 74 % (ref 38.4–76.8)
Platelets: 219 10*3/uL (ref 145–400)
RBC: 3.8 10*6/uL (ref 3.70–5.45)
RDW: 12.6 % (ref 11.2–14.5)
WBC: 5.3 10*3/uL (ref 3.9–10.3)

## 2015-05-14 LAB — COMPREHENSIVE METABOLIC PANEL (CC13)
ALK PHOS: 60 U/L (ref 40–150)
ALT: 13 U/L (ref 0–55)
AST: 16 U/L (ref 5–34)
Albumin: 3.8 g/dL (ref 3.5–5.0)
Anion Gap: 10 mEq/L (ref 3–11)
BUN: 15 mg/dL (ref 7.0–26.0)
CALCIUM: 9.1 mg/dL (ref 8.4–10.4)
CO2: 26 mEq/L (ref 22–29)
Chloride: 104 mEq/L (ref 98–109)
Creatinine: 1.2 mg/dL — ABNORMAL HIGH (ref 0.6–1.1)
EGFR: 50 mL/min/{1.73_m2} — AB (ref >90)
Glucose: 96 mg/dl (ref 70–140)
Potassium: 3.7 mEq/L (ref 3.5–5.1)
SODIUM: 140 meq/L (ref 136–145)
TOTAL PROTEIN: 6.4 g/dL (ref 6.4–8.3)
Total Bilirubin: 0.27 mg/dL (ref 0.20–1.20)

## 2015-05-14 MED ORDER — HYDROMORPHONE HCL 4 MG PO TABS: 4.0000 mg | ORAL_TABLET | Freq: Four times a day (QID) | ORAL | Status: DC | PRN

## 2015-05-14 MED ORDER — ZOLEDRONIC ACID 4 MG/100ML IV SOLN
4.0000 mg | Freq: Once | INTRAVENOUS | Status: AC
  Administered 2015-05-14: 4 mg via INTRAVENOUS
  Filled 2015-05-14: qty 100

## 2015-05-14 MED ORDER — LENALIDOMIDE 15 MG PO CAPS: ORAL_CAPSULE | ORAL | Status: DC

## 2015-05-14 MED ORDER — ZOLEDRONIC ACID 4 MG/5ML IV CONC
3.0000 mg | Freq: Once | INTRAVENOUS | Status: DC
  Filled 2015-05-14: qty 3.75

## 2015-05-14 MED ORDER — ALTEPLASE 2 MG IJ SOLR
2.0000 mg | Freq: Once | INTRAMUSCULAR | Status: DC | PRN
Start: 2015-05-14 — End: 2015-05-14
  Filled 2015-05-14: qty 2

## 2015-05-14 MED ORDER — SODIUM CHLORIDE 0.9 % IV SOLN
Freq: Once | INTRAVENOUS | Status: AC
  Administered 2015-05-14: 15:00:00 via INTRAVENOUS

## 2015-05-14 NOTE — Assessment & Plan Note (Signed)
Due to painful neuropathic pain, I would discontinue Velcade. I discussed the role of maintenance therapy especially that she declined bone marrow transplant now. I recommend switching her to Revlimid. My plan would be to give her 15 mg Revlimid daily from days 1-21 and 7 days off. She is advised to continue on acyclovir prophylaxis for now. She will also need to restart 81 mg aspirin for DVT prophylaxis. She will start taking Revlimid from 05/24/2015 and I will see her on 06/04/2015 for toxicity reviewed. We will continue Zometa every 3 months.

## 2015-05-14 NOTE — Progress Notes (Signed)
Pt denies need for additional IVF's today.  No Velcade today per Dr. Alvy Bimler.  Pt to receive Zometa 4 mg IV today per Dr. Alvy Bimler.

## 2015-05-14 NOTE — Assessment & Plan Note (Signed)
She has neuropathic pain in her back which I think is exacerbated by Velcade. We discussed pain management. In the short-term, we will continue pain medicine. I will discontinue Velcade and switch her to Revlimid. If her pain remains severe, I will consider adding gabapentin in the next visit

## 2015-05-14 NOTE — Patient Instructions (Signed)

## 2015-05-14 NOTE — Telephone Encounter (Signed)
Pt confirmed labs/ov per 06/10 POF, gave pt AVS and Calendar.... KJ °

## 2015-05-14 NOTE — Progress Notes (Signed)
[facility] OFFICE PROGRESS NOTE  Patient Care Team: Hulan Fess, MD as PCP - General (Family Medicine) Luanne Bras, MD as Consulting Physician (Interventional Radiology)  SUMMARY OF ONCOLOGIC HISTORY: Oncology History   Multiple myeloma without remission; Calcium 13.3, Creatinine 2.83, Hg 8.3, Bone lesions present, Beta 2 microglobulin 12.8, Albumin 3.5, IgA 1760 and kappa light chains 1670. Bone marrow biopsy showed 82% involvement.   Primary site: Multiple Myeloma   Staging method: AJCC 6th Edition   Clinical: Stage IIIB signed by Heath Lark, MD on 07/13/2014 12:38 PM   Summary: Stage IIIB       Multiple myeloma without remission   07/07/2014 - 07/10/2014 Hospital Admission She was admitted to the hospital for management of malignant hypercalcemia and had bone marrow biopsy which confirmed diagnosis of multiple myeloma. The patient received one unit of blood transfusion due to severe anemia.   07/08/2014 Bone Marrow Biopsy The patient had bone marrow biopsy that confirmed diagnosis.   07/08/2014 Pathology Results Accession: [id number] showed 82% involvement of bone marrow.   07/08/2014 - 10/19/2014 Chemotherapy She was started on Velcade along with dexamethasone.   08/21/2014 - 10/19/2014 Chemotherapy I added Revlimid and Zometa to her chemotherapy regimen.   09/24/2014 Pathology Results Accession: [id number] biopsy showed plasma cells involvement.   09/24/2014 Procedure she has kyphoplasty performed.   09/30/2014 Bone Marrow Biopsy Accession: [id number] bone marrow biopsy showed residual plasma cell, approximately 11%   11/06/2014 - 05/07/2015 Chemotherapy She was started on Velcade maintenance every other week, treatment is discontinued due to neuropathic pain.    INTERVAL HISTORY: Please see below for problem oriented charting. She returns for further follow-up. She complained of severe neuropathic pain in her back radiating up to her upper thoracic area. This is  prohibiting activities of daily living and she is not able to wean herself off pain medicine. Her appetite remains stable. She denies recent weight loss. Denies recent infection. No recent nausea or vomiting or constipation.  REVIEW OF SYSTEMS:   Constitutional: Denies fevers, chills or abnormal weight loss Eyes: Denies blurriness of vision Ears, nose, mouth, throat, and face: Denies mucositis or sore throat Respiratory: Denies cough, dyspnea or wheezes Cardiovascular: Denies palpitation, chest discomfort or lower extremity swelling Gastrointestinal:  Denies nausea, heartburn or change in bowel habits Skin: Denies abnormal skin rashes Lymphatics: Denies new lymphadenopathy or easy bruising Neurological:Denies numbness, tingling or new weaknesses Behavioral/Psych: Mood is stable, no new changes  All other systems were reviewed with the patient and are negative.  I have reviewed the past medical history, past surgical history, social history and family history with the patient and they are unchanged from previous note.  ALLERGIES:  is allergic to hydrocodone; ace inhibitors; and cephalexin.  MEDICATIONS:  Current Outpatient Prescriptions  Medication Sig Dispense Refill  . acyclovir (ZOVIRAX) 400 MG tablet TAKE 1 TABLET BY MOUTH EVERY DAY 30 tablet 5  . amLODipine (NORVASC) 10 MG tablet TAKE 1 TABLET BY MOUTH EVERY DAY 90 tablet 3  . cholecalciferol (VITAMIN D) 1000 UNITS tablet Take 2,000 Units by mouth every morning.     . [facility] SENNA PLUS 8.6-50 MG per tablet TAKE 2 TABLETS BY MOUTH TWICE A DAY 60 tablet 1  . HYDROmorphone (DILAUDID) 4 MG tablet Take 1 tablet (4 mg total) by mouth every 6 (six) hours as needed for severe pain. 90 tablet 0  . ondansetron (ZOFRAN) 8 MG tablet TAKE 1 TABLET (8 MG TOTAL) BY MOUTH EVERY 8 (EIGHT) HOURS AS NEEDED  FOR NAUSEA. 60 tablet 3  . polyvinyl alcohol (LIQUIFILM TEARS) 1.4 % ophthalmic solution Place 1 drop into both eyes as needed for dry eyes.     .  Calcium-Phosphorus-Vitamin D (CITRACAL +D3 PO) Take 1 tablet by mouth daily. 500 u-$RemoveBefo'400mg'teGceAoAnOq$     . dexamethasone (DECADRON) 4 MG tablet Take 20 mg by mouth once a week. On Monday.    Marland Kitchen HYDROcodone-homatropine (HYCODAN) 5-1.5 MG/5ML syrup Take 5 mLs by mouth every 6 (six) hours as needed for cough. (Patient not taking: Reported on 04/02/2015) 473 mL 0  . lenalidomide (REVLIMID) 15 MG capsule Take 1 daily for 21 days and then take 7 days off 21 capsule 0   No current facility-administered medications for this visit.   Facility-Administered Medications Ordered in Other Visits  Medication Dose Route Frequency Provider Last Rate Last Dose  . alteplase (CATHFLO ACTIVASE) injection 2 mg  2 mg Intracatheter Once PRN Heath Lark, MD      . zolendronic acid (ZOMETA) 3 mg in sodium chloride 0.9 % 100 mL IVPB  3 mg Intravenous Once Heath Lark, MD        PHYSICAL EXAMINATION: ECOG PERFORMANCE STATUS: 1 - Symptomatic but completely ambulatory  Filed Vitals:   05/14/15 1430  BP: 135/81  Pulse: 90  Temp: 98.2 F (36.8 C)  Resp: 18   Filed Weights   05/14/15 1430  Weight: 108 lb 1.6 oz (49.034 kg)    GENERAL:alert, no distress and comfortable. She appears thin SKIN: skin color, texture, turgor are normal, no rashes or significant lesions EYES: normal, Conjunctiva are pink and non-injected, sclera clear Musculoskeletal:no cyanosis of digits and no clubbing  NEURO: alert & oriented x 3 with fluent speech, no focal motor/sensory deficits  LABORATORY DATA:  I have reviewed the data as listed    Component Value Date/Time   NA 140 05/14/2015 1416   NA 143 09/24/2014 0722   K 3.7 05/14/2015 1416   K 3.8 09/24/2014 0722   CL 105 09/24/2014 0722   CO2 26 05/14/2015 1416   CO2 27 09/24/2014 0722   GLUCOSE 96 05/14/2015 1416   GLUCOSE 80 09/24/2014 0722   BUN 15.0 05/14/2015 1416   BUN 17 09/24/2014 0722   CREATININE 1.2* 05/14/2015 1416   CREATININE 1.22* 09/24/2014 0722   CALCIUM 9.1 05/14/2015  1416   CALCIUM 8.6 09/24/2014 0722   PROT 6.4 05/14/2015 1416   PROT 7.2 07/13/2014 1222   ALBUMIN 3.8 05/14/2015 1416   ALBUMIN 3.9 07/13/2014 1222   AST 16 05/14/2015 1416   AST 22 07/13/2014 1222   ALT 13 05/14/2015 1416   ALT 75* 07/13/2014 1222   ALKPHOS 60 05/14/2015 1416   ALKPHOS 90 07/13/2014 1222   BILITOT 0.27 05/14/2015 1416   BILITOT 0.4 07/13/2014 1222   GFRNONAA 47* 09/24/2014 0722   GFRAA 54* 09/24/2014 0722    No results found for: SPEP, UPEP  Lab Results  Component Value Date   WBC 5.3 05/14/2015   NEUTROABS 3.9 05/14/2015   HGB 11.9 05/14/2015   HCT 35.9 05/14/2015   MCV 94.5 05/14/2015   PLT 219 05/14/2015      Chemistry      Component Value Date/Time   NA 140 05/14/2015 1416   NA 143 09/24/2014 0722   K 3.7 05/14/2015 1416   K 3.8 09/24/2014 0722   CL 105 09/24/2014 0722   CO2 26 05/14/2015 1416   CO2 27 09/24/2014 0722   BUN 15.0 05/14/2015 1416   BUN  17 09/24/2014 0722   CREATININE 1.2* 05/14/2015 1416   CREATININE 1.22* 09/24/2014 0722      Component Value Date/Time   CALCIUM 9.1 05/14/2015 1416   CALCIUM 8.6 09/24/2014 0722   ALKPHOS 60 05/14/2015 1416   ALKPHOS 90 07/13/2014 1222   AST 16 05/14/2015 1416   AST 22 07/13/2014 1222   ALT 13 05/14/2015 1416   ALT 75* 07/13/2014 1222   BILITOT 0.27 05/14/2015 1416   BILITOT 0.4 07/13/2014 1222    ASSESSMENT & PLAN:  Multiple myeloma without remission Due to painful neuropathic pain, I would discontinue Velcade. I discussed the role of maintenance therapy especially that she declined bone marrow transplant now. I recommend switching her to Revlimid. My plan would be to give her 15 mg Revlimid daily from days 1-21 and 7 days off. She is advised to continue on acyclovir prophylaxis for now. She will also need to restart 81 mg aspirin for DVT prophylaxis. She will start taking Revlimid from 05/24/2015 and I will see her on 06/04/2015 for toxicity reviewed. We will continue Zometa  every 3 months.  Bone pain This is slightly worse recently complicated by neuropathic pain. I refill her pain medication prescription today. I recommend discontinuation of Velcade. If he is not much better in the future, I will consider adding gabapentin.      Neuropathy due to chemotherapeutic drug She has neuropathic pain in her back which I think is exacerbated by Velcade. We discussed pain management. In the short-term, we will continue pain medicine. I will discontinue Velcade and switch her to Revlimid. If her pain remains severe, I will consider adding gabapentin in the next visit   Orders Placed This Encounter  Procedures  . CBC with Differential/Platelet    Standing Status: Standing     Number of Occurrences: 22     Standing Expiration Date: 05/13/2016  . Comprehensive metabolic panel    Standing Status: Standing     Number of Occurrences: 22     Standing Expiration Date: 05/13/2016   All questions were answered. The patient knows to call the clinic with any problems, questions or concerns. No barriers to learning was detected. I spent 30 minutes counseling the patient face to face. The total time spent in the appointment was 40 minutes and more than 50% was on counseling and review of test results     [facility], [place], MD 05/14/2015 3:41 PM

## 2015-05-14 NOTE — Assessment & Plan Note (Signed)
This is slightly worse recently complicated by neuropathic pain. I refill her pain medication prescription today. I recommend discontinuation of Velcade. If he is not much better in the future, I will consider adding gabapentin.

## 2015-05-17 ENCOUNTER — Other Ambulatory Visit: Payer: Self-pay | Admitting: *Deleted

## 2015-05-17 MED ORDER — LENALIDOMIDE 15 MG PO CAPS: ORAL_CAPSULE | ORAL | Status: DC

## 2015-05-17 NOTE — Telephone Encounter (Signed)
LVM for pt asking which Specialty pharmacy she got her Revlimid from last year?  It looks like the last pharmacy was [facility] but pt has also used [facility].   Pt is to start Revlimid on 6/20.

## 2015-05-21 ENCOUNTER — Encounter: Payer: Self-pay | Admitting: Hematology and Oncology

## 2015-05-21 NOTE — Progress Notes (Signed)
I faxed [facility] [phone] notes/form for revlimid

## 2015-06-01 ENCOUNTER — Telehealth: Payer: Self-pay | Admitting: Hematology and Oncology

## 2015-06-01 ENCOUNTER — Telehealth: Payer: Self-pay | Admitting: *Deleted

## 2015-06-01 NOTE — Telephone Encounter (Signed)
Pt states there was a delay in getting her Revlimid as she had to go out of town for a funeral.  She has the revlimid now and starting it today.  She asks if she should keep her appt w/ Dr. Alvy Bimler as scheduled on 7/1 or does Dr. Alvy Bimler want to move appt out further?

## 2015-06-01 NOTE — Telephone Encounter (Signed)
s.w pt and advised on July appt....pt ok and aware of 7.1 cx and moved to 7.15

## 2015-06-04 ENCOUNTER — Other Ambulatory Visit: Payer: Self-pay | Admitting: Hematology and Oncology

## 2015-06-04 ENCOUNTER — Ambulatory Visit: Payer: PRIVATE HEALTH INSURANCE | Admitting: Hematology and Oncology

## 2015-06-04 ENCOUNTER — Other Ambulatory Visit: Payer: PRIVATE HEALTH INSURANCE

## 2015-06-18 ENCOUNTER — Telehealth: Payer: Self-pay | Admitting: Hematology and Oncology

## 2015-06-18 ENCOUNTER — Encounter: Payer: Self-pay | Admitting: Hematology and Oncology

## 2015-06-18 ENCOUNTER — Telehealth: Payer: Self-pay | Admitting: *Deleted

## 2015-06-18 ENCOUNTER — Ambulatory Visit (HOSPITAL_BASED_OUTPATIENT_CLINIC_OR_DEPARTMENT_OTHER): Payer: PRIVATE HEALTH INSURANCE | Admitting: Hematology and Oncology

## 2015-06-18 ENCOUNTER — Other Ambulatory Visit (HOSPITAL_BASED_OUTPATIENT_CLINIC_OR_DEPARTMENT_OTHER): Payer: PRIVATE HEALTH INSURANCE

## 2015-06-18 VITALS — BP 125/70 | HR 70 | Temp 98.6°F | Resp 18 | Ht 60.0 in | Wt 107.1 lb

## 2015-06-18 DIAGNOSIS — K5909 Other constipation: Secondary | ICD-10-CM | POA: Diagnosis not present

## 2015-06-18 DIAGNOSIS — M898X9 Other specified disorders of bone, unspecified site: Secondary | ICD-10-CM

## 2015-06-18 DIAGNOSIS — F329 Major depressive disorder, single episode, unspecified: Secondary | ICD-10-CM

## 2015-06-18 DIAGNOSIS — R5382 Chronic fatigue, unspecified: Secondary | ICD-10-CM

## 2015-06-18 DIAGNOSIS — F32A Depression, unspecified: Secondary | ICD-10-CM

## 2015-06-18 DIAGNOSIS — C9000 Multiple myeloma not having achieved remission: Secondary | ICD-10-CM | POA: Diagnosis not present

## 2015-06-18 DIAGNOSIS — M899 Disorder of bone, unspecified: Secondary | ICD-10-CM | POA: Diagnosis not present

## 2015-06-18 HISTORY — DX: Chronic fatigue, unspecified: R53.82

## 2015-06-18 LAB — COMPREHENSIVE METABOLIC PANEL (CC13)
ALK PHOS: 75 U/L (ref 40–150)
ALT: 26 U/L (ref 0–55)
AST: 17 U/L (ref 5–34)
Albumin: 4 g/dL (ref 3.5–5.0)
Anion Gap: 9 mEq/L (ref 3–11)
BILIRUBIN TOTAL: 0.33 mg/dL (ref 0.20–1.20)
BUN: 16.6 mg/dL (ref 7.0–26.0)
CALCIUM: 9.7 mg/dL (ref 8.4–10.4)
CO2: 27 meq/L (ref 22–29)
Chloride: 103 mEq/L (ref 98–109)
Creatinine: 0.9 mg/dL (ref 0.6–1.1)
EGFR: 67 mL/min/{1.73_m2} — ABNORMAL LOW (ref >90)
Glucose: 84 mg/dl (ref 70–140)
Potassium: 3.8 mEq/L (ref 3.5–5.1)
Sodium: 139 mEq/L (ref 136–145)
Total Protein: 6.6 g/dL (ref 6.4–8.3)

## 2015-06-18 LAB — CBC WITH DIFFERENTIAL/PLATELET
BASO%: 1 % (ref 0.0–2.0)
BASOS ABS: 0 10*3/uL (ref 0.0–0.1)
EOS%: 4.1 % (ref 0.0–7.0)
Eosinophils Absolute: 0.2 10*3/uL (ref 0.0–0.5)
HCT: 37.2 % (ref 34.8–46.6)
HGB: 12.4 g/dL (ref 11.6–15.9)
LYMPH%: 14.5 % (ref 14.0–49.7)
MCH: 30.9 pg (ref 25.1–34.0)
MCHC: 33.2 g/dL (ref 31.5–36.0)
MCV: 92.9 fL (ref 79.5–101.0)
MONO#: 0.7 10*3/uL (ref 0.1–0.9)
MONO%: 13.3 % (ref 0.0–14.0)
NEUT#: 3.4 10*3/uL (ref 1.5–6.5)
NEUT%: 67.1 % (ref 38.4–76.8)
Platelets: 222 10*3/uL (ref 145–400)
RBC: 4 10*6/uL (ref 3.70–5.45)
RDW: 13.1 % (ref 11.2–14.5)
WBC: 5.1 10*3/uL (ref 3.9–10.3)
lymph#: 0.7 10*3/uL — ABNORMAL LOW (ref 0.9–3.3)

## 2015-06-18 MED ORDER — HYDROMORPHONE HCL 2 MG PO TABS: 2.0000 mg | ORAL_TABLET | Freq: Four times a day (QID) | ORAL | Status: DC | PRN

## 2015-06-18 NOTE — Assessment & Plan Note (Signed)
She is getting progressive fatigue and wants to treatment. Her last set of blood work from end of May 2016 shows she has attained very good partial response. We discussed the risks, benefits, side effects of discontinuation of treatment. She would like to try. I plan to repeat blood work again in 2 months history and physical examination and reassess. I will continue maintenance Zometa every 3 months, next would be due in September 2016. Recommend she stops taking dexamethasone.

## 2015-06-18 NOTE — Assessment & Plan Note (Signed)
This is slightly worse recently complicated by neuropathic pain. I refill her pain medication prescription today. Per patient request, we will reduce Dilaudid  to 2 mg tablet to take as needed.

## 2015-06-18 NOTE — Telephone Encounter (Signed)
per pof to sch pt appt-gave pt copyof avs-sent MW email to sch zometa-pt aware

## 2015-06-18 NOTE — Assessment & Plan Note (Signed)
She has significant fatigue, depression and constipation. She is blaming that the medications could be a contributing factor and I agreed. In addition, I would also plan to check TSH level in her next visit to make sure I exclude hypothyroidism.

## 2015-06-18 NOTE — Assessment & Plan Note (Signed)
She has significant constipation. Hopefully, by reducing the dose of her pain medicine, this would be less problematic Also recommend her to stop amlodipine because her blood pressure is borderline low and that could sometime also cause risk of constipation with a calcium channel blocker.

## 2015-06-18 NOTE — Telephone Encounter (Signed)
Per staff message and POF I have scheduled appts. Advised scheduler of appts. JMW  

## 2015-06-18 NOTE — Progress Notes (Signed)
[facility] OFFICE PROGRESS NOTE  Patient Care Team: Hulan Fess, MD as PCP - General (Family Medicine) Luanne Bras, MD as Consulting Physician (Interventional Radiology)  SUMMARY OF ONCOLOGIC HISTORY: Oncology History   Multiple myeloma without remission; Calcium 13.3, Creatinine 2.83, Hg 8.3, Bone lesions present, Beta 2 microglobulin 12.8, Albumin 3.5, IgA 1760 and kappa light chains 1670. Bone marrow biopsy showed 82% involvement.   Primary site: Multiple Myeloma   Staging method: AJCC 6th Edition   Clinical: Stage IIIB signed by Heath Lark, MD on 07/13/2014 12:38 PM   Summary: Stage IIIB       Multiple myeloma without remission   07/07/2014 - 07/10/2014 Hospital Admission She was admitted to the hospital for management of malignant hypercalcemia and had bone marrow biopsy which confirmed diagnosis of multiple myeloma. The patient received one unit of blood transfusion due to severe anemia.   07/08/2014 Bone Marrow Biopsy The patient had bone marrow biopsy that confirmed diagnosis.   07/08/2014 Pathology Results Accession: [id number] showed 82% involvement of bone marrow.   07/08/2014 - 10/19/2014 Chemotherapy She was started on Velcade along with dexamethasone.   08/21/2014 - 10/19/2014 Chemotherapy I added Revlimid and Zometa to her chemotherapy regimen.   09/24/2014 Pathology Results Accession: [id number] biopsy showed plasma cells involvement.   09/24/2014 Procedure she has kyphoplasty performed.   09/30/2014 Bone Marrow Biopsy Accession: [id number] bone marrow biopsy showed residual plasma cell, approximately 11%   11/06/2014 - 05/07/2015 Chemotherapy She was started on Velcade maintenance every other week, treatment is discontinued due to neuropathic pain.   06/01/2015 -  Chemotherapy The patient received Revlimid and start taking it as maintenance therapy.    INTERVAL HISTORY: Please see below for problem oriented charting. She returns for further follow-up. She  has 4 more pills to take. She wants to stop chemotherapy at this is causing depression, constipation and bone pain. She wants a treatment holiday. Her bone pain is less and she would like to reduce the dose of her pain medicine.  REVIEW OF SYSTEMS:   Constitutional: Denies fevers, chills or abnormal weight loss Eyes: Denies blurriness of vision Ears, nose, mouth, throat, and face: Denies mucositis or sore throat Respiratory: Denies cough, dyspnea or wheezes Cardiovascular: Denies palpitation, chest discomfort or lower extremity swelling Skin: Denies abnormal skin rashes Lymphatics: Denies new lymphadenopathy or easy bruising Neurological:Denies numbness, tingling or new weaknesses Behavioral/Psych: Mood is stable, no new changes  All other systems were reviewed with the patient and are negative.  I have reviewed the past medical history, past surgical history, social history and family history with the patient and they are unchanged from previous note.  ALLERGIES:  is allergic to hydrocodone; ace inhibitors; and cephalexin.  MEDICATIONS:  Current Outpatient Prescriptions  Medication Sig Dispense Refill  . acyclovir (ZOVIRAX) 400 MG tablet TAKE 1 TABLET BY MOUTH EVERY DAY 30 tablet 5  . amLODipine (NORVASC) 10 MG tablet TAKE 1 TABLET BY MOUTH EVERY DAY 90 tablet 3  . Calcium-Phosphorus-Vitamin D (CITRACAL +D3 PO) Take 1 tablet by mouth daily. 500 u-$RemoveBefo'400mg'NRDtVrvmgTv$     . cholecalciferol (VITAMIN D) 1000 UNITS tablet Take 2,000 Units by mouth every morning.     . [facility] SENNA PLUS 8.6-50 MG per tablet TAKE 2 TABLETS BY MOUTH TWICE A DAY 60 tablet 1  . dexamethasone (DECADRON) 4 MG tablet Take 20 mg by mouth once a week. On Monday.    Marland Kitchen HYDROcodone-homatropine (HYCODAN) 5-1.5 MG/5ML syrup Take 5 mLs by mouth every 6 (six)  hours as needed for cough. 473 mL 0  . HYDROmorphone (DILAUDID) 2 MG tablet Take 1 tablet (2 mg total) by mouth every 6 (six) hours as needed for severe pain. 90 tablet 0  .  lenalidomide (REVLIMID) 15 MG capsule Take 1 daily for 21 days and then take 7 days off 21 capsule 0  . ondansetron (ZOFRAN) 8 MG tablet TAKE 1 TABLET (8 MG TOTAL) BY MOUTH EVERY 8 (EIGHT) HOURS AS NEEDED FOR NAUSEA. 60 tablet 3  . polyvinyl alcohol (LIQUIFILM TEARS) 1.4 % ophthalmic solution Place 1 drop into both eyes as needed for dry eyes.      No current facility-administered medications for this visit.    PHYSICAL EXAMINATION: ECOG PERFORMANCE STATUS: 1 - Symptomatic but completely ambulatory  Filed Vitals:   06/18/15 1426  BP: 125/70  Pulse: 70  Temp: 98.6 F (37 C)  Resp: 18   Filed Weights   06/18/15 1426  Weight: 107 lb 1.6 oz (48.58 kg)    GENERAL:alert, no distress and comfortable. She looks thin and cachectic SKIN: skin color, texture, turgor are normal, no rashes or significant lesions EYES: normal, Conjunctiva are pink and non-injected, sclera clear OROPHARYNX:no exudate, no erythema and lips, buccal mucosa, and tongue normal  NECK: supple, thyroid normal size, non-tender, without nodularity LYMPH:  no palpable lymphadenopathy in the cervical, axillary or inguinal LUNGS: clear to auscultation and percussion with normal breathing effort HEART: regular rate & rhythm and no murmurs and no lower extremity edema ABDOMEN:abdomen soft, non-tender and normal bowel sounds Musculoskeletal:no cyanosis of digits and no clubbing  NEURO: alert & oriented x 3 with fluent speech, no focal motor/sensory deficits  LABORATORY DATA:  I have reviewed the data as listed    Component Value Date/Time   NA 139 06/18/2015 1413   NA 143 09/24/2014 0722   K 3.8 06/18/2015 1413   K 3.8 09/24/2014 0722   CL 105 09/24/2014 0722   CO2 27 06/18/2015 1413   CO2 27 09/24/2014 0722   GLUCOSE 84 06/18/2015 1413   GLUCOSE 80 09/24/2014 0722   BUN 16.6 06/18/2015 1413   BUN 17 09/24/2014 0722   CREATININE 0.9 06/18/2015 1413   CREATININE 1.22* 09/24/2014 0722   CALCIUM 9.7 06/18/2015 1413    CALCIUM 8.6 09/24/2014 0722   PROT 6.6 06/18/2015 1413   PROT 7.2 07/13/2014 1222   ALBUMIN 4.0 06/18/2015 1413   ALBUMIN 3.9 07/13/2014 1222   AST 17 06/18/2015 1413   AST 22 07/13/2014 1222   ALT 26 06/18/2015 1413   ALT 75* 07/13/2014 1222   ALKPHOS 75 06/18/2015 1413   ALKPHOS 90 07/13/2014 1222   BILITOT 0.33 06/18/2015 1413   BILITOT 0.4 07/13/2014 1222   GFRNONAA 47* 09/24/2014 0722   GFRAA 54* 09/24/2014 0722    No results found for: SPEP, UPEP  Lab Results  Component Value Date   WBC 5.1 06/18/2015   NEUTROABS 3.4 06/18/2015   HGB 12.4 06/18/2015   HCT 37.2 06/18/2015   MCV 92.9 06/18/2015   PLT 222 06/18/2015      Chemistry      Component Value Date/Time   NA 139 06/18/2015 1413   NA 143 09/24/2014 0722   K 3.8 06/18/2015 1413   K 3.8 09/24/2014 0722   CL 105 09/24/2014 0722   CO2 27 06/18/2015 1413   CO2 27 09/24/2014 0722   BUN 16.6 06/18/2015 1413   BUN 17 09/24/2014 0722   CREATININE 0.9 06/18/2015 1413   CREATININE  1.22* 09/24/2014 0722      Component Value Date/Time   CALCIUM 9.7 06/18/2015 1413   CALCIUM 8.6 09/24/2014 0722   ALKPHOS 75 06/18/2015 1413   ALKPHOS 90 07/13/2014 1222   AST 17 06/18/2015 1413   AST 22 07/13/2014 1222   ALT 26 06/18/2015 1413   ALT 75* 07/13/2014 1222   BILITOT 0.33 06/18/2015 1413   BILITOT 0.4 07/13/2014 1222     ASSESSMENT & PLAN:  Multiple myeloma without remission She is getting progressive fatigue and wants to treatment. Her last set of blood work from end of May 2016 shows she has attained very good partial response. We discussed the risks, benefits, side effects of discontinuation of treatment. She would like to try. I plan to repeat blood work again in 2 months history and physical examination and reassess. I will continue maintenance Zometa every 3 months, next would be due in September 2016. Recommend she stops taking dexamethasone.  Bone pain This is slightly worse recently complicated by  neuropathic pain. I refill her pain medication prescription today. Per patient request, we will reduce Dilaudid  to 2 mg tablet to take as needed.   Other constipation She has significant constipation. Hopefully, by reducing the dose of her pain medicine, this would be less problematic Also recommend her to stop amlodipine because her blood pressure is borderline low and that could sometime also cause risk of constipation with a calcium channel blocker.  Chronic fatigue She has significant fatigue, depression and constipation. She is blaming that the medications could be a contributing factor and I agreed. In addition, I would also plan to check TSH level in her next visit to make sure I exclude hypothyroidism.   Orders Placed This Encounter  Procedures  . SPEP & IFE with QIG    Standing Status: Future     Number of Occurrences:      Standing Expiration Date: 07/22/2016  . Kappa/lambda light chains    Standing Status: Future     Number of Occurrences:      Standing Expiration Date: 07/22/2016  . TSH    Standing Status: Future     Number of Occurrences:      Standing Expiration Date: 07/22/2016   All questions were answered. The patient knows to call the clinic with any problems, questions or concerns. No barriers to learning was detected. I spent 30 minutes counseling the patient face to face. The total time spent in the appointment was 40 minutes and more than 50% was on counseling and review of test results     [facility], Rama Sorci, MD 06/18/2015 5:03 PM

## 2015-06-25 ENCOUNTER — Other Ambulatory Visit: Payer: Self-pay | Admitting: *Deleted

## 2015-06-25 NOTE — Telephone Encounter (Signed)
Per Dr. Alvy Bimler, Revlimid has been discontinued. Faxed this to [facility].

## 2015-06-30 ENCOUNTER — Telehealth: Payer: Self-pay | Admitting: *Deleted

## 2015-06-30 NOTE — Telephone Encounter (Signed)
Left message for patient. Dr Alvy Bimler agrees with sudafed for now

## 2015-06-30 NOTE — Telephone Encounter (Signed)
Patient calls asking for advice for a "cold".  She has finished her 3 weeks of Revlimid.  She has a sore throat, clear sinus drainage, aches and fatigue, some chills off and on.  She denies fever, cough.  She has taken sudafed before and asks if she can take this.  She is currently not taking blood pressure medication.   Let her know she can use the sudafed, along with plenty of fluids and rest.   She should call us if she develops fever, cough, nasal drainage that is not clear or worsening sx.  I will let Dr. Alvy Bimler know her question in case she wants her to come in to check her counts.  Pt. Call back is [phone]

## 2015-06-30 NOTE — Telephone Encounter (Signed)
Agree with just sudafed for now

## 2015-07-28 ENCOUNTER — Other Ambulatory Visit: Payer: Self-pay | Admitting: Hematology and Oncology

## 2015-07-28 ENCOUNTER — Telehealth: Payer: Self-pay | Admitting: *Deleted

## 2015-07-28 DIAGNOSIS — C9000 Multiple myeloma not having achieved remission: Secondary | ICD-10-CM

## 2015-07-28 MED ORDER — HYDROMORPHONE HCL 2 MG PO TABS: 2.0000 mg | ORAL_TABLET | Freq: Four times a day (QID) | ORAL | Status: DC | PRN

## 2015-07-28 MED ORDER — BUTALBITAL-APAP-CAFFEINE 50-325-40 MG PO TABS: 1.0000 | ORAL_TABLET | Freq: Four times a day (QID) | ORAL | Status: DC | PRN

## 2015-07-28 NOTE — Telephone Encounter (Signed)
She can try OTC medications If still bad, she can try Fioricet. I will write a small amount for her to try Prescription is ready at [facility] for Dilaudid and Fioricet

## 2015-07-28 NOTE — Telephone Encounter (Signed)
Patient called requesting refill of her dilaudid.  She states she will run out before she sees Dr. Alvy Bimler in September.  She also is complaining of a headache which she thinks might be from seasonal allergies and wonders what she can use.  She has taken medication before for this, however, it has been a long time.  Let her know that she can probably use something like allegra or claritin.  However, it would be best to check with her pharmacist and let him suggest which antihistamine would be best in light of the other medications she is currently on.  Let her know we will call her when her prescription is ready to be picked up.  Work phone is [phone].  Home phone is [phone].

## 2015-08-11 ENCOUNTER — Telehealth: Payer: Self-pay | Admitting: Hematology and Oncology

## 2015-08-11 NOTE — Telephone Encounter (Signed)
returned call and s.w pt and confirm appts.....pt ok and aware °

## 2015-08-13 ENCOUNTER — Other Ambulatory Visit (HOSPITAL_BASED_OUTPATIENT_CLINIC_OR_DEPARTMENT_OTHER): Payer: PRIVATE HEALTH INSURANCE

## 2015-08-13 DIAGNOSIS — R5382 Chronic fatigue, unspecified: Secondary | ICD-10-CM

## 2015-08-13 DIAGNOSIS — C9000 Multiple myeloma not having achieved remission: Secondary | ICD-10-CM

## 2015-08-13 DIAGNOSIS — F32A Depression, unspecified: Secondary | ICD-10-CM

## 2015-08-13 DIAGNOSIS — F329 Major depressive disorder, single episode, unspecified: Secondary | ICD-10-CM

## 2015-08-13 LAB — CBC WITH DIFFERENTIAL/PLATELET
BASO%: 0.6 % (ref 0.0–2.0)
Basophils Absolute: 0 10*3/uL (ref 0.0–0.1)
EOS ABS: 0.1 10*3/uL (ref 0.0–0.5)
EOS%: 1.5 % (ref 0.0–7.0)
HCT: 38.3 % (ref 34.8–46.6)
HEMOGLOBIN: 12.6 g/dL (ref 11.6–15.9)
LYMPH%: 21 % (ref 14.0–49.7)
MCH: 31.3 pg (ref 25.1–34.0)
MCHC: 32.9 g/dL (ref 31.5–36.0)
MCV: 95 fL (ref 79.5–101.0)
MONO#: 0.4 10*3/uL (ref 0.1–0.9)
MONO%: 8.4 % (ref 0.0–14.0)
NEUT#: 3.1 10*3/uL (ref 1.5–6.5)
NEUT%: 68.5 % (ref 38.4–76.8)
PLATELETS: 213 10*3/uL (ref 145–400)
RBC: 4.03 10*6/uL (ref 3.70–5.45)
RDW: 14.2 % (ref 11.2–14.5)
WBC: 4.5 10*3/uL (ref 3.9–10.3)
lymph#: 0.9 10*3/uL (ref 0.9–3.3)

## 2015-08-13 LAB — COMPREHENSIVE METABOLIC PANEL (CC13)
ALBUMIN: 4.2 g/dL (ref 3.5–5.0)
ALK PHOS: 71 U/L (ref 40–150)
ALT: 14 U/L (ref 0–55)
ANION GAP: 8 meq/L (ref 3–11)
AST: 16 U/L (ref 5–34)
BILIRUBIN TOTAL: 0.41 mg/dL (ref 0.20–1.20)
BUN: 13.7 mg/dL (ref 7.0–26.0)
CO2: 28 mEq/L (ref 22–29)
Calcium: 9.4 mg/dL (ref 8.4–10.4)
Chloride: 106 mEq/L (ref 98–109)
Creatinine: 0.9 mg/dL (ref 0.6–1.1)
EGFR: 65 mL/min/{1.73_m2} — AB (ref >90)
Glucose: 86 mg/dl (ref 70–140)
Potassium: 4 mEq/L (ref 3.5–5.1)
Sodium: 142 mEq/L (ref 136–145)
TOTAL PROTEIN: 6.8 g/dL (ref 6.4–8.3)

## 2015-08-13 LAB — TSH CHCC: TSH: 1.046 m(IU)/L (ref 0.308–3.960)

## 2015-08-17 LAB — SPEP & IFE WITH QIG
ABNORMAL PROTEIN BAND1: 0.2 g/dL
ALPHA-2-GLOBULIN: 0.8 g/dL (ref 0.5–0.9)
Albumin ELP: 4.4 g/dL (ref 3.8–4.8)
Alpha-1-Globulin: 0.3 g/dL (ref 0.2–0.3)
Beta 2: 0.5 g/dL (ref 0.2–0.5)
Beta Globulin: 0.4 g/dL (ref 0.4–0.6)
Gamma Globulin: 0.4 g/dL — ABNORMAL LOW (ref 0.8–1.7)
IGA: 167 mg/dL (ref 69–380)
IGG (IMMUNOGLOBIN G), SERUM: 587 mg/dL — AB (ref 690–1700)
IgM, Serum: 84 mg/dL (ref 52–322)
TOTAL PROTEIN, SERUM ELECTROPHOR: 6.8 g/dL (ref 6.1–8.1)

## 2015-08-17 LAB — KAPPA/LAMBDA LIGHT CHAINS
KAPPA FREE LGHT CHN: 20.5 mg/dL — AB (ref 0.33–1.94)
Kappa:Lambda Ratio: 34.75 — ABNORMAL HIGH (ref 0.26–1.65)
Lambda Free Lght Chn: 0.59 mg/dL (ref 0.57–2.63)

## 2015-08-20 ENCOUNTER — Ambulatory Visit (HOSPITAL_BASED_OUTPATIENT_CLINIC_OR_DEPARTMENT_OTHER): Payer: PRIVATE HEALTH INSURANCE | Admitting: Hematology and Oncology

## 2015-08-20 ENCOUNTER — Ambulatory Visit (HOSPITAL_BASED_OUTPATIENT_CLINIC_OR_DEPARTMENT_OTHER): Payer: PRIVATE HEALTH INSURANCE

## 2015-08-20 ENCOUNTER — Telehealth: Payer: Self-pay | Admitting: Hematology and Oncology

## 2015-08-20 ENCOUNTER — Encounter: Payer: Self-pay | Admitting: Hematology and Oncology

## 2015-08-20 VITALS — BP 164/99 | HR 82 | Temp 98.3°F | Resp 18 | Ht 60.0 in | Wt 105.0 lb

## 2015-08-20 DIAGNOSIS — I10 Essential (primary) hypertension: Secondary | ICD-10-CM

## 2015-08-20 DIAGNOSIS — R29898 Other symptoms and signs involving the musculoskeletal system: Secondary | ICD-10-CM | POA: Diagnosis not present

## 2015-08-20 DIAGNOSIS — M898X9 Other specified disorders of bone, unspecified site: Secondary | ICD-10-CM

## 2015-08-20 DIAGNOSIS — C9000 Multiple myeloma not having achieved remission: Secondary | ICD-10-CM

## 2015-08-20 DIAGNOSIS — G620 Drug-induced polyneuropathy: Secondary | ICD-10-CM

## 2015-08-20 DIAGNOSIS — Z23 Encounter for immunization: Secondary | ICD-10-CM

## 2015-08-20 DIAGNOSIS — T451X5A Adverse effect of antineoplastic and immunosuppressive drugs, initial encounter: Secondary | ICD-10-CM

## 2015-08-20 DIAGNOSIS — M899 Disorder of bone, unspecified: Secondary | ICD-10-CM

## 2015-08-20 DIAGNOSIS — Z299 Encounter for prophylactic measures, unspecified: Secondary | ICD-10-CM

## 2015-08-20 MED ORDER — SODIUM CHLORIDE 0.9 % IV SOLN
Freq: Once | INTRAVENOUS | Status: AC
  Administered 2015-08-20: 14:00:00 via INTRAVENOUS

## 2015-08-20 MED ORDER — ZOLEDRONIC ACID 4 MG/100ML IV SOLN
4.0000 mg | Freq: Once | INTRAVENOUS | Status: AC
  Administered 2015-08-20: 4 mg via INTRAVENOUS
  Filled 2015-08-20: qty 100

## 2015-08-20 MED ORDER — INFLUENZA VAC SPLIT QUAD 0.5 ML IM SUSY
0.5000 mL | PREFILLED_SYRINGE | Freq: Once | INTRAMUSCULAR | Status: AC
  Administered 2015-08-20: 0.5 mL via INTRAMUSCULAR
  Filled 2015-08-20: qty 0.5

## 2015-08-20 MED ORDER — HYDROMORPHONE HCL 2 MG PO TABS: 2.0000 mg | ORAL_TABLET | Freq: Four times a day (QID) | ORAL | Status: DC | PRN

## 2015-08-20 MED ORDER — LIDOCAINE-PRILOCAINE 2.5-2.5 % EX CREA: 1.0000 "application " | TOPICAL_CREAM | CUTANEOUS | Status: DC | PRN

## 2015-08-20 NOTE — Progress Notes (Signed)
[facility] OFFICE PROGRESS NOTE  Patient Care Team: Hulan Fess, MD as PCP - General (Family Medicine) Luanne Bras, MD as Consulting Physician (Interventional Radiology)  SUMMARY OF ONCOLOGIC HISTORY: Oncology History   Multiple myeloma without remission; Calcium 13.3, Creatinine 2.83, Hg 8.3, Bone lesions present, Beta 2 microglobulin 12.8, Albumin 3.5, IgA 1760 and kappa light chains 1670. Bone marrow biopsy showed 82% involvement.   Primary site: Multiple Myeloma   Staging method: AJCC 6th Edition   Clinical: Stage IIIB signed by Heath Lark, MD on 07/13/2014 12:38 PM   Summary: Stage IIIB       Multiple myeloma without remission   07/07/2014 - 07/10/2014 Hospital Admission She was admitted to the hospital for management of malignant hypercalcemia and had bone marrow biopsy which confirmed diagnosis of multiple myeloma. The patient received one unit of blood transfusion due to severe anemia.   07/08/2014 Bone Marrow Biopsy The patient had bone marrow biopsy that confirmed diagnosis.   07/08/2014 Pathology Results Accession: [id number] showed 82% involvement of bone marrow.   07/08/2014 - 10/19/2014 Chemotherapy She was started on Velcade along with dexamethasone.   08/21/2014 - 10/19/2014 Chemotherapy I added Revlimid and Zometa to her chemotherapy regimen.   09/24/2014 Pathology Results Accession: [id number] biopsy showed plasma cells involvement.   09/24/2014 Procedure she has kyphoplasty performed.   09/30/2014 Bone Marrow Biopsy Accession: [id number] bone marrow biopsy showed residual plasma cell, approximately 11%   11/06/2014 - 05/07/2015 Chemotherapy She was started on Velcade maintenance every other week, treatment is discontinued due to neuropathic pain.   06/01/2015 -  Chemotherapy The patient received Revlimid and start taking it as maintenance therapy.    INTERVAL HISTORY: Please see below for problem oriented charting. She returns for further follow-up. She  continues to have persistent lower back pain and burning pain associated with neuropathy pain. She denies dental issue. Denies recent infection. She complained of severe headaches recently with blurriness of vision. Her blood pressure was noted to be high. She denies any focal neurological deficits. She has intermittent headaches because of hypertension.  REVIEW OF SYSTEMS:   Constitutional: Denies fevers, chills or abnormal weight loss Eyes: Denies blurriness of vision Ears, nose, mouth, throat, and face: Denies mucositis or sore throat Respiratory: Denies cough, dyspnea or wheezes Cardiovascular: Denies palpitation, chest discomfort or lower extremity swelling Gastrointestinal:  Denies nausea, heartburn or change in bowel habits Skin: Denies abnormal skin rashes Lymphatics: Denies new lymphadenopathy or easy bruising Neurological:Denies numbness, tingling or new weaknesses Behavioral/Psych: Mood is stable, no new changes  All other systems were reviewed with the patient and are negative.  I have reviewed the past medical history, past surgical history, social history and family history with the patient and they are unchanged from previous note.  ALLERGIES:  is allergic to hydrocodone; ace inhibitors; and cephalexin.  MEDICATIONS:  Current Outpatient Prescriptions  Medication Sig Dispense Refill  . acyclovir (ZOVIRAX) 400 MG tablet TAKE 1 TABLET BY MOUTH EVERY DAY 30 tablet 5  . amLODipine (NORVASC) 10 MG tablet TAKE 1 TABLET BY MOUTH EVERY DAY 90 tablet 3  . butalbital-acetaminophen-caffeine (FIORICET) 50-325-40 MG per tablet Take 1 tablet by mouth every 6 (six) hours as needed for headache. 30 tablet 0  . cholecalciferol (VITAMIN D) 1000 UNITS tablet Take 2,000 Units by mouth every morning.     . [facility] SENNA PLUS 8.6-50 MG per tablet TAKE 2 TABLETS BY MOUTH TWICE A DAY 60 tablet 1  . HYDROcodone-homatropine (HYCODAN) 5-1.5 MG/5ML syrup  Take 5 mLs by mouth every 6 (six) hours as  needed for cough. 473 mL 0  . HYDROmorphone (DILAUDID) 2 MG tablet Take 1 tablet (2 mg total) by mouth every 6 (six) hours as needed for severe pain. 90 tablet 0  . ondansetron (ZOFRAN) 8 MG tablet TAKE 1 TABLET (8 MG TOTAL) BY MOUTH EVERY 8 (EIGHT) HOURS AS NEEDED FOR NAUSEA. 60 tablet 3  . polyvinyl alcohol (LIQUIFILM TEARS) 1.4 % ophthalmic solution Place 1 drop into both eyes as needed for dry eyes.     Marland Kitchen lidocaine-prilocaine (EMLA) cream Apply 1 application topically as needed. 30 g 6   No current facility-administered medications for this visit.    PHYSICAL EXAMINATION: ECOG PERFORMANCE STATUS: 1 - Symptomatic but completely ambulatory  Filed Vitals:   08/20/15 1316  BP: 164/99  Pulse: 82  Temp: 98.3 F (36.8 C)  Resp: 18   Filed Weights   08/20/15 1316  Weight: 105 lb (47.628 kg)    GENERAL:alert, no distress and comfortable. She looks thin and cachectic SKIN: skin color, texture, turgor are normal, no rashes or significant lesions EYES: normal, Conjunctiva are pink and non-injected, sclera clear OROPHARYNX:no exudate, no erythema and lips, buccal mucosa, and tongue normal  NECK: supple, thyroid normal size, non-tender, without nodularity LYMPH:  no palpable lymphadenopathy in the cervical, axillary or inguinal LUNGS: clear to auscultation and percussion with normal breathing effort HEART: regular rate & rhythm and no murmurs and no lower extremity edema ABDOMEN:abdomen soft, non-tender and normal bowel sounds Musculoskeletal:no cyanosis of digits and no clubbing . She has tenderness in her mid back/thoracic region NEURO: alert & oriented x 3 with fluent speech, no focal motor/sensory deficits  LABORATORY DATA:  I have reviewed the data as listed    Component Value Date/Time   NA 142 08/13/2015 1337   NA 143 09/24/2014 0722   K 4.0 08/13/2015 1337   K 3.8 09/24/2014 0722   CL 105 09/24/2014 0722   CO2 28 08/13/2015 1337   CO2 27 09/24/2014 0722   GLUCOSE 86  08/13/2015 1337   GLUCOSE 80 09/24/2014 0722   BUN 13.7 08/13/2015 1337   BUN 17 09/24/2014 0722   CREATININE 0.9 08/13/2015 1337   CREATININE 1.22* 09/24/2014 0722   CALCIUM 9.4 08/13/2015 1337   CALCIUM 8.6 09/24/2014 0722   PROT 6.8 08/13/2015 1337   PROT 7.2 07/13/2014 1222   ALBUMIN 4.2 08/13/2015 1337   ALBUMIN 3.9 07/13/2014 1222   AST 16 08/13/2015 1337   AST 22 07/13/2014 1222   ALT 14 08/13/2015 1337   ALT 75* 07/13/2014 1222   ALKPHOS 71 08/13/2015 1337   ALKPHOS 90 07/13/2014 1222   BILITOT 0.41 08/13/2015 1337   BILITOT 0.4 07/13/2014 1222   GFRNONAA 47* 09/24/2014 0722   GFRAA 54* 09/24/2014 0722    No results found for: SPEP, UPEP  Lab Results  Component Value Date   WBC 4.5 08/13/2015   NEUTROABS 3.1 08/13/2015   HGB 12.6 08/13/2015   HCT 38.3 08/13/2015   MCV 95.0 08/13/2015   PLT 213 08/13/2015      Chemistry      Component Value Date/Time   NA 142 08/13/2015 1337   NA 143 09/24/2014 0722   K 4.0 08/13/2015 1337   K 3.8 09/24/2014 0722   CL 105 09/24/2014 0722   CO2 28 08/13/2015 1337   CO2 27 09/24/2014 0722   BUN 13.7 08/13/2015 1337   BUN 17 09/24/2014 0722   CREATININE  0.9 08/13/2015 1337   CREATININE 1.22* 09/24/2014 0722      Component Value Date/Time   CALCIUM 9.4 08/13/2015 1337   CALCIUM 8.6 09/24/2014 0722   ALKPHOS 71 08/13/2015 1337   ALKPHOS 90 07/13/2014 1222   AST 16 08/13/2015 1337   AST 22 07/13/2014 1222   ALT 14 08/13/2015 1337   ALT 75* 07/13/2014 1222   BILITOT 0.41 08/13/2015 1337   BILITOT 0.4 07/13/2014 1222    ASSESSMENT & PLAN:  Multiple myeloma without remission She is getting progressive fatigue and wants to treatment. Her last set of blood work from end of May 2016 shows she has attained very good partial response. We discussed the risks, benefits, side effects of discontinuation of treatment and had treatment was subsequently discontinued at the end of May. 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 I plan to repeat blood work again in 3 months history and physical examination and reassess. I will continue maintenance Zometa every 3 months, next would be due in December 2016. She will continue calcium with vitamin D supplement She denies recent dental issue.  Essential hypertension She has high blood pressure since discontinuation of amlodipine. I recommend she resume her prescription and recheck her blood pressure with her primary care office within the next 2 weeks  Preventive measure We discussed the importance of preventive care and reviewed the vaccination programs. She does not have any prior allergic reactions to influenza vaccination. She agrees to proceed with influenza vaccination today and we will administer it today at the clinic.   Neuropathy due to chemotherapeutic drug She does not want to take Neurontin because of constipation. The location of the burning pain is in a small spot in her back. I gave her prescription of topical lidocaine to try  Bone pain This is slightly worse recently complicated by neuropathic pain. I refill her pain medication prescription today. Per patient request, we will reduce Dilaudid  to 2 mg tablet to take as needed.     Orders Placed This Encounter  Procedures  . SPEP & IFE with QIG    Standing Status: Future     Number of Occurrences:      Standing Expiration Date: 09/23/2016  . Kappa/lambda light chains    Standing Status: Future     Number of Occurrences:      Standing Expiration Date: 09/23/2016   All questions were answered. The patient knows to call the clinic with any problems, questions or concerns. No barriers to learning was detected. I spent 25 minutes counseling the patient face to face. The total time spent in the appointment was 30 minutes and more than 50% was on counseling and review of test results     [facility], [place], MD 08/20/2015 3:01 PM

## 2015-08-20 NOTE — Assessment & Plan Note (Signed)
She has high blood pressure since discontinuation of amlodipine. I recommend she resume her prescription and recheck her blood pressure with her primary care office within the next 2 weeks

## 2015-08-20 NOTE — Progress Notes (Signed)
Pt declined extra IV fluids.  States she feels she is hydrating plenty with oral fluids at home.

## 2015-08-20 NOTE — Assessment & Plan Note (Signed)
She is getting progressive fatigue and wants to treatment. Her last set of blood work from end of May 2016 shows she has attained very good partial response. We discussed the risks, benefits, side effects of discontinuation of treatment and had treatment was subsequently discontinued at the end of May. 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 I plan to repeat blood work again in 3 months history and physical examination and reassess. I will continue maintenance Zometa every 3 months, next would be due in December 2016. She will continue calcium with vitamin D supplement She denies recent dental issue.

## 2015-08-20 NOTE — Assessment & Plan Note (Signed)
We discussed the importance of preventive care and reviewed the vaccination programs. She does not have any prior allergic reactions to influenza vaccination. She agrees to proceed with influenza vaccination today and we will administer it today at the clinic.  

## 2015-08-20 NOTE — Telephone Encounter (Signed)
Pt confirmed labs/ov/chemo per 09/16 POF, gave pt AVS and Calendar... KJ

## 2015-08-20 NOTE — Assessment & Plan Note (Signed)
She does not want to take Neurontin because of constipation. The location of the burning pain is in a small spot in her back. I gave her prescription of topical lidocaine to try

## 2015-08-20 NOTE — Patient Instructions (Signed)

## 2015-08-20 NOTE — Assessment & Plan Note (Signed)
This is slightly worse recently complicated by neuropathic pain. I refill her pain medication prescription today. Per patient request, we will reduce Dilaudid  to 2 mg tablet to take as needed.  

## 2015-09-15 ENCOUNTER — Other Ambulatory Visit: Payer: Self-pay | Admitting: *Deleted

## 2015-09-15 ENCOUNTER — Telehealth: Payer: Self-pay | Admitting: *Deleted

## 2015-09-15 DIAGNOSIS — C9000 Multiple myeloma not having achieved remission: Secondary | ICD-10-CM

## 2015-09-15 MED ORDER — HYDROMORPHONE HCL 2 MG PO TABS: 2.0000 mg | ORAL_TABLET | Freq: Four times a day (QID) | ORAL | Status: DC | PRN

## 2015-09-15 NOTE — Telephone Encounter (Signed)
LVM for pt informing Rx ready to pick up.Marland Kitchen

## 2015-09-15 NOTE — Telephone Encounter (Signed)
TC from patient requesting refill on her dilaudid 2 mg tabs. She states she is now taking 4 tablets a day. Last filled on 08/20/15 for 90 tablets.  Pt states she has enough to get her to the weekend. Please call pt when script is available for pick up.

## 2015-10-12 ENCOUNTER — Telehealth: Payer: Self-pay | Admitting: *Deleted

## 2015-10-12 ENCOUNTER — Other Ambulatory Visit (HOSPITAL_BASED_OUTPATIENT_CLINIC_OR_DEPARTMENT_OTHER): Payer: 59

## 2015-10-12 ENCOUNTER — Other Ambulatory Visit: Payer: Self-pay | Admitting: Hematology and Oncology

## 2015-10-12 ENCOUNTER — Encounter: Payer: Self-pay | Admitting: *Deleted

## 2015-10-12 DIAGNOSIS — R300 Dysuria: Secondary | ICD-10-CM

## 2015-10-12 DIAGNOSIS — C9000 Multiple myeloma not having achieved remission: Secondary | ICD-10-CM

## 2015-10-12 LAB — URINALYSIS, MICROSCOPIC - CHCC
Bilirubin (Urine): NEGATIVE
GLUCOSE UR CHCC: NEGATIVE mg/dL
KETONES: NEGATIVE mg/dL
Leukocyte Esterase: NEGATIVE
Nitrite: NEGATIVE
Protein: 30 mg/dL
SPECIFIC GRAVITY, URINE: 1.02 (ref 1.003–1.035)
UROBILINOGEN UR: 0.2 mg/dL (ref 0.2–1)
WBC UA: NEGATIVE (ref 0–2)
pH: 6 (ref 4.6–8.0)

## 2015-10-12 MED ORDER — HYDROMORPHONE HCL 2 MG PO TABS: 2.0000 mg | ORAL_TABLET | Freq: Four times a day (QID) | ORAL | Status: DC | PRN

## 2015-10-12 NOTE — Telephone Encounter (Signed)
Pt complaining of major discomfort, burning, urgency and frequent urination. Has been using OTC meds. Dr Alvy Bimler instructed to get urine culture. Also refill on pain med

## 2015-10-14 ENCOUNTER — Telehealth: Payer: Self-pay | Admitting: *Deleted

## 2015-10-14 LAB — URINE CULTURE

## 2015-10-14 MED ORDER — CIPROFLOXACIN HCL 250 MG PO TABS: 250.0000 mg | ORAL_TABLET | Freq: Two times a day (BID) | ORAL | Status: DC

## 2015-10-14 NOTE — Telephone Encounter (Signed)
-----   Message from Heath Lark, MD sent at 10/14/2015  1:12 PM EST ----- Regarding: urine culture Urine culture suggested contamination She is symptomatic and I recommend a trial of cipro 250 mg BID PO x 5 days Please call in to her pharmacy  ----- Message -----    From: [facility]    Sent: 10/12/2015   3:10 PM      To: Heath Lark, MD

## 2015-10-14 NOTE — Telephone Encounter (Signed)
Informed pt of Dr. Calton Dach message below.  She verbalized understanding.  [facility]

## 2015-11-11 ENCOUNTER — Telehealth: Payer: Self-pay | Admitting: *Deleted

## 2015-11-11 ENCOUNTER — Other Ambulatory Visit (HOSPITAL_BASED_OUTPATIENT_CLINIC_OR_DEPARTMENT_OTHER): Payer: 59

## 2015-11-11 ENCOUNTER — Other Ambulatory Visit: Payer: Self-pay | Admitting: Hematology and Oncology

## 2015-11-11 DIAGNOSIS — C9000 Multiple myeloma not having achieved remission: Secondary | ICD-10-CM | POA: Diagnosis not present

## 2015-11-11 LAB — COMPREHENSIVE METABOLIC PANEL
ALT: 16 U/L (ref 0–55)
ANION GAP: 12 meq/L — AB (ref 3–11)
AST: 17 U/L (ref 5–34)
Albumin: 4.2 g/dL (ref 3.5–5.0)
Alkaline Phosphatase: 66 U/L (ref 40–150)
BUN: 16.6 mg/dL (ref 7.0–26.0)
CHLORIDE: 105 meq/L (ref 98–109)
CO2: 25 meq/L (ref 22–29)
Calcium: 10.1 mg/dL (ref 8.4–10.4)
Creatinine: 1.1 mg/dL (ref 0.6–1.1)
EGFR: 54 mL/min/{1.73_m2} — AB (ref >90)
Glucose: 82 mg/dl (ref 70–140)
POTASSIUM: 3.6 meq/L (ref 3.5–5.1)
Sodium: 143 mEq/L (ref 136–145)
Total Bilirubin: <0.3 mg/dL (ref 0.20–1.20)
Total Protein: 7.4 g/dL (ref 6.4–8.3)

## 2015-11-11 LAB — CBC WITH DIFFERENTIAL/PLATELET
BASO%: 0.7 % (ref 0.0–2.0)
BASOS ABS: 0 10*3/uL (ref 0.0–0.1)
EOS ABS: 0.1 10*3/uL (ref 0.0–0.5)
EOS%: 0.9 % (ref 0.0–7.0)
HCT: 37.6 % (ref 34.8–46.6)
HGB: 12.5 g/dL (ref 11.6–15.9)
LYMPH%: 21.4 % (ref 14.0–49.7)
MCH: 32.4 pg (ref 25.1–34.0)
MCHC: 33.3 g/dL (ref 31.5–36.0)
MCV: 97.4 fL (ref 79.5–101.0)
MONO#: 0.6 10*3/uL (ref 0.1–0.9)
MONO%: 9.7 % (ref 0.0–14.0)
NEUT#: 4.1 10*3/uL (ref 1.5–6.5)
NEUT%: 67.3 % (ref 38.4–76.8)
PLATELETS: 222 10*3/uL (ref 145–400)
RBC: 3.86 10*6/uL (ref 3.70–5.45)
RDW: 14 % (ref 11.2–14.5)
WBC: 6.1 10*3/uL (ref 3.9–10.3)
lymph#: 1.3 10*3/uL (ref 0.9–3.3)

## 2015-11-11 MED ORDER — HYDROMORPHONE HCL 2 MG PO TABS: 2.0000 mg | ORAL_TABLET | Freq: Four times a day (QID) | ORAL | Status: DC | PRN

## 2015-11-11 NOTE — Telephone Encounter (Signed)
Informed pt her Rx is ready to pick up.

## 2015-11-11 NOTE — Telephone Encounter (Signed)
TC from patient requesting refill on her dilaudid 2 mg tabs. Last filled on 10/12/15 for 90 tabs. Pt is coming to cancer today today @ 3:45 for labs and would like to pick up the prescription this afternoon if possible. Please call pt @ work to let her know that prescription will be available  For her to pick up.

## 2015-11-11 NOTE — Telephone Encounter (Signed)
Ready for pick up

## 2015-11-12 ENCOUNTER — Telehealth: Payer: Self-pay | Admitting: *Deleted

## 2015-11-12 ENCOUNTER — Ambulatory Visit: Payer: 59 | Admitting: Hematology and Oncology

## 2015-11-12 NOTE — Telephone Encounter (Signed)
Informed pt Dr. Alvy Bimler has preliminary lab results and needs to discuss results w/ pt in office next week.  Informed her labs are not good and need to discuss treatment options.  Pt says she is not surprised labs are not good as she has been "very exhausted" lately and just "can't sleep enough."  Gave her options for appt next week on Monday, Tues or Friday and she wants to wait until Friday at 2 pm to see Dr. Alvy Bimler.

## 2015-11-15 LAB — SPEP & IFE WITH QIG
ALBUMIN ELP: 4.5 g/dL (ref 3.8–4.8)
ALPHA-1-GLOBULIN: 0.3 g/dL (ref 0.2–0.3)
Abnormal Protein Band1: 0.4 g/dL
Alpha-2-Globulin: 0.9 g/dL (ref 0.5–0.9)
BETA 2: 0.7 g/dL — AB (ref 0.2–0.5)
Beta Globulin: 0.4 g/dL (ref 0.4–0.6)
GAMMA GLOBULIN: 0.3 g/dL — AB (ref 0.8–1.7)
IGA: 352 mg/dL (ref 69–380)
IGG (IMMUNOGLOBIN G), SERUM: 473 mg/dL — AB (ref 690–1700)
IGM, SERUM: 46 mg/dL — AB (ref 52–322)
Total Protein, Serum Electrophoresis: 7 g/dL (ref 6.1–8.1)

## 2015-11-15 LAB — KAPPA/LAMBDA LIGHT CHAINS
Kappa free light chain: 139 mg/dL — ABNORMAL HIGH (ref 0.33–1.94)
Kappa:Lambda Ratio: 201.45 — ABNORMAL HIGH (ref 0.26–1.65)
LAMBDA FREE LGHT CHN: 0.69 mg/dL (ref 0.57–2.63)

## 2015-11-15 IMAGING — CR DG RIBS W/ CHEST 3+V*L*
3 series · 3 of 3 positions shown · non-contrast
Comparison: December 05, 2012

CLINICAL DATA: Pain left anterior ribs, possible injury moving
boxes

EXAM:
LEFT RIBS AND CHEST - 3+ VIEW

[w chest pa]
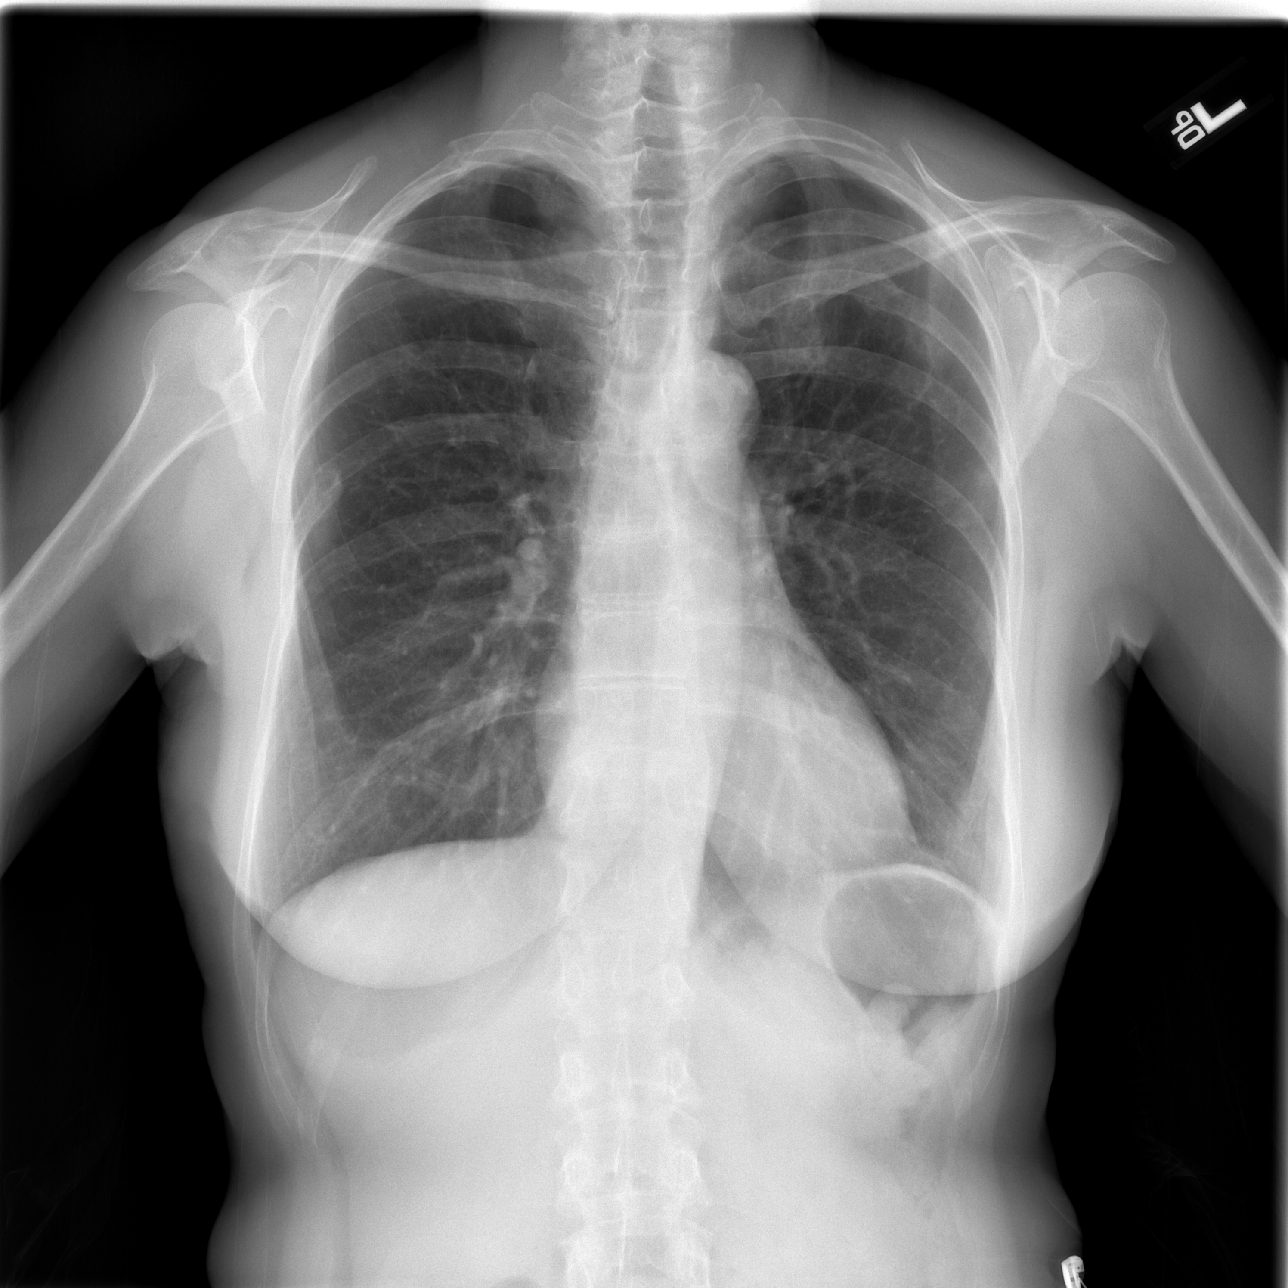

[w ribs ap/pa lower left *]
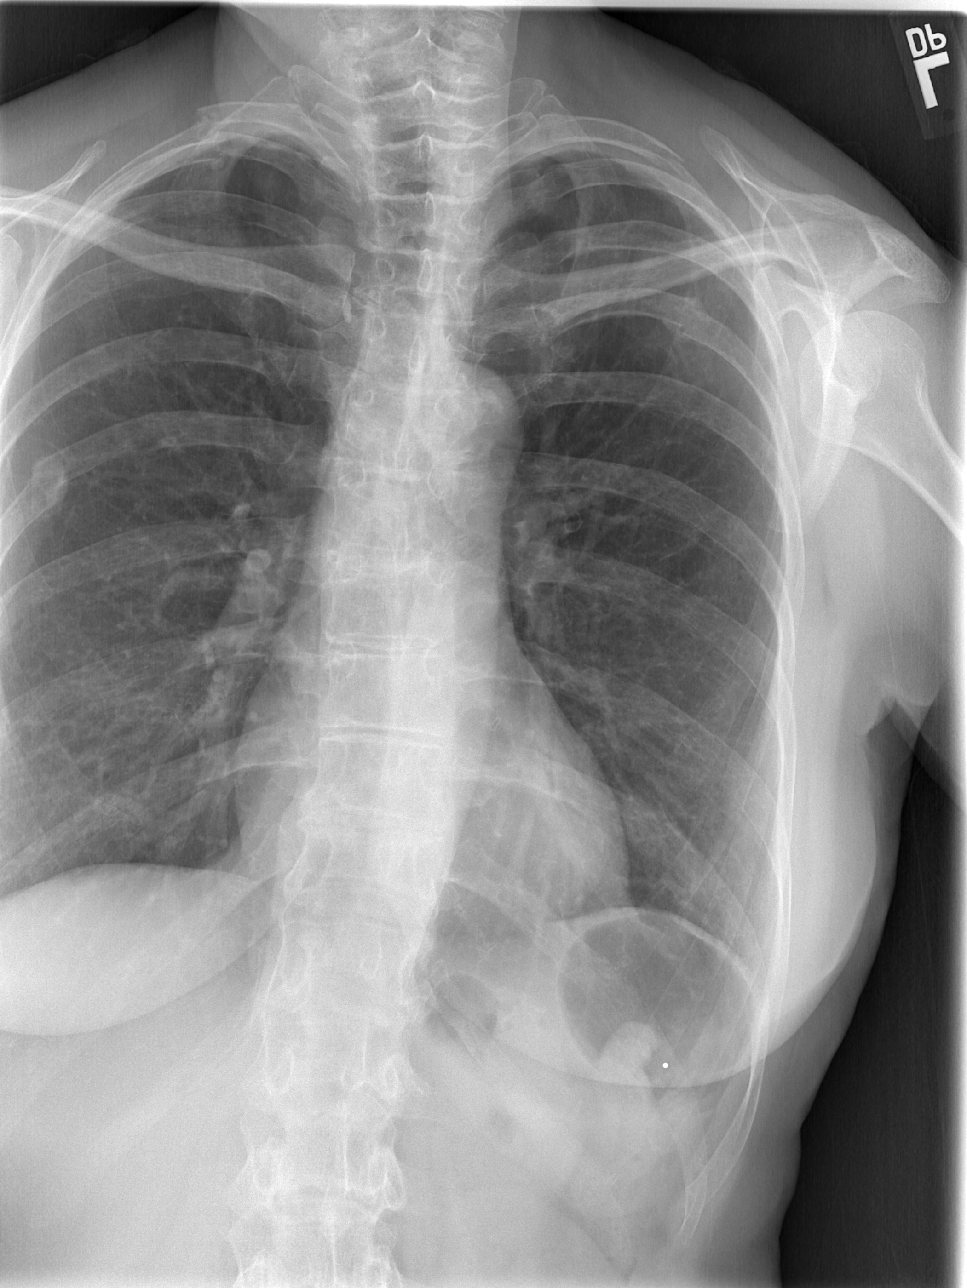

[w ribs oblique left *]
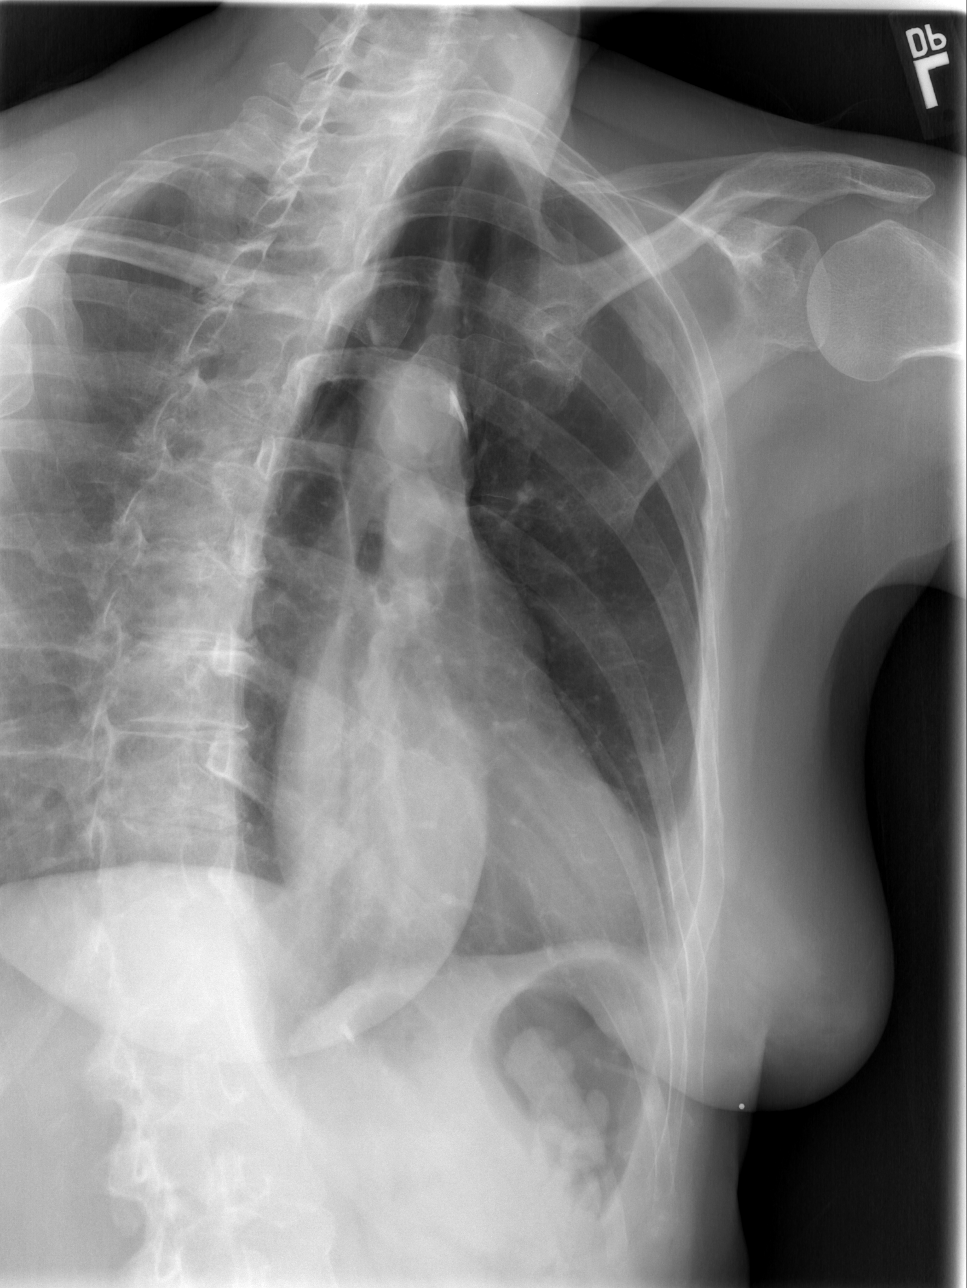

[3 of 3 positions shown; findings below may reference images not displayed]

FINDINGS: The heart size and vascular pattern are normal. Lungs are clear with
no consolidation effusion or pneumothorax. There is a fracture of
the posterior right second rib. This is mildly to moderately
displaced. There is a mildly displaced fracture the posterior right
third rib. All right-sided rib fractures show evidence of callus
formation.

On the left, there is a mildly displaced fracture of the posterior
lateral second rib. No callus formation identified. There is a
fracture the posterior left fifth rib with callus formation. A BB
indicates the point of tenderness over anterior inferior left ribs,
with no fractures seen in this area.
IMPRESSION: Numerous bilateral rib fractures new from 2047, all demonstrating a
subacute to chronic appearance except for fracture of the left
second rib. This fracture demonstrates an acute appearance without
callus formation. No fractures identified involving anterior
inferior rib cage.

## 2015-11-19 ENCOUNTER — Ambulatory Visit (HOSPITAL_BASED_OUTPATIENT_CLINIC_OR_DEPARTMENT_OTHER): Payer: 59 | Admitting: Hematology and Oncology

## 2015-11-19 ENCOUNTER — Telehealth: Payer: Self-pay | Admitting: Hematology and Oncology

## 2015-11-19 ENCOUNTER — Encounter: Payer: Self-pay | Admitting: Hematology and Oncology

## 2015-11-19 ENCOUNTER — Other Ambulatory Visit: Payer: Self-pay | Admitting: *Deleted

## 2015-11-19 VITALS — BP 147/82 | HR 98 | Temp 97.9°F | Resp 18 | Ht 60.0 in | Wt 102.4 lb

## 2015-11-19 DIAGNOSIS — R5382 Chronic fatigue, unspecified: Secondary | ICD-10-CM

## 2015-11-19 DIAGNOSIS — R29898 Other symptoms and signs involving the musculoskeletal system: Secondary | ICD-10-CM | POA: Diagnosis not present

## 2015-11-19 DIAGNOSIS — C9000 Multiple myeloma not having achieved remission: Secondary | ICD-10-CM | POA: Diagnosis not present

## 2015-11-19 DIAGNOSIS — M898X9 Other specified disorders of bone, unspecified site: Secondary | ICD-10-CM

## 2015-11-19 MED ORDER — IXAZOMIB CITRATE 4 MG PO CAPS: ORAL_CAPSULE | ORAL | Status: DC

## 2015-11-19 MED ORDER — LENALIDOMIDE 5 MG PO CAPS: ORAL_CAPSULE | ORAL | Status: DC

## 2015-11-19 NOTE — Assessment & Plan Note (Signed)
We have a very long discussion today. Her serum light chains are very high and IgA level has doubled. She has symptoms of fatigue and increasing bone pain. There is no doubt that her disease have relapsed. The patient appeared depressed and she would really like to spend the Christmas holiday without worrying about her treatment. I discussed with her various treatment options according to current NCCN guidelines. Ultimately, we elected to treat her with combination therapy with dexamethasone weekly, Velcade or Ninlaro weekly along with Revlimid for 21 days/28 days cycle and monthly Zometa. The patient is quite reluctant to resume Revlimid because she felt that it contributed to her fatigue. I recommend combination therapy rather than single agent and would prefer to introduce Revlimid even at a smaller dose. She has obtained dental clearance recently. I will reschedule his Zometa to start on December 30. In the meantime, I will send her prescriptions to get insurance pre-authorization and determine the co-pay. If Ninlaor cost too much, we will use Velcade SQ injection instead and I will get that pre-certification as well. I will see her back on December 30 for further review. I recommend she start pulse dexamethasone weekly on Fridays.

## 2015-11-19 NOTE — Assessment & Plan Note (Signed)
This is slightly worse recently complicated by neuropathic pain. I refill her pain medication prescription recently Per patient request, we will reduce Dilaudid to 2 mg tablet to take as needed. I hope with the introduction of dexamethasone, that would help with her bone pain.

## 2015-11-19 NOTE — Telephone Encounter (Signed)
lvm for pt regarding to DEC appt.....pt ok and aware °

## 2015-11-19 NOTE — Progress Notes (Signed)
[place] OFFICE PROGRESS NOTE  Patient Care Team: Hulan Fess, MD as PCP - General (Family Medicine) Luanne Bras, MD as Consulting Physician (Interventional Radiology)  SUMMARY OF ONCOLOGIC HISTORY: Oncology History   Multiple myeloma without remission; Calcium 13.3, Creatinine 2.83, Hg 8.3, Bone lesions present, Beta 2 microglobulin 12.8, Albumin 3.5, IgA 1760 and kappa light chains 1670. Bone marrow biopsy showed 82% involvement.   Primary site: Multiple Myeloma   Staging method: AJCC 6th Edition   Clinical: Stage IIIB signed by Heath Lark, MD on 07/13/2014 12:38 PM   Summary: Stage IIIB       Multiple myeloma without remission ([place])   07/07/2014 - 07/10/2014 Hospital Admission She was admitted to the hospital for management of malignant hypercalcemia and had bone marrow biopsy which confirmed diagnosis of multiple myeloma. The patient received one unit of blood transfusion due to severe anemia.   07/08/2014 Bone Marrow Biopsy The patient had bone marrow biopsy that confirmed diagnosis.   07/08/2014 Pathology Results Accession: [id number] showed 82% involvement of bone marrow.   07/08/2014 - 10/19/2014 Chemotherapy She was started on Velcade along with dexamethasone.   08/21/2014 - 10/19/2014 Chemotherapy I added Revlimid and Zometa to her chemotherapy regimen.   09/24/2014 Pathology Results Accession: [id number] biopsy showed plasma cells involvement.   09/24/2014 Procedure she has kyphoplasty performed.   09/30/2014 Bone Marrow Biopsy Accession: [id number] bone marrow biopsy showed residual plasma cell, approximately 11%   11/06/2014 - 05/07/2015 Chemotherapy She was started on Velcade maintenance every other week, treatment is discontinued due to neuropathic pain.   06/01/2015 -  Chemotherapy The patient received Revlimid and start taking it as maintenance therapy.    INTERVAL HISTORY: Please see below for problem oriented charting. She returns today to review test  results. Unfortunately, her serum light chains have increased significantly. She complained of fatigue and increasing bone pain. She denies recent infection. She denies worsening neuropathy.  REVIEW OF SYSTEMS:   Constitutional: Denies fevers, chills or abnormal weight loss Eyes: Denies blurriness of vision Ears, nose, mouth, throat, and face: Denies mucositis or sore throat Respiratory: Denies cough, dyspnea or wheezes Cardiovascular: Denies palpitation, chest discomfort or lower extremity swelling Gastrointestinal:  Denies nausea, heartburn or change in bowel habits Skin: Denies abnormal skin rashes Lymphatics: Denies new lymphadenopathy or easy bruising Neurological:Denies numbness, tingling or new weaknesses Behavioral/Psych: Mood is stable, no new changes  All other systems were reviewed with the patient and are negative.  I have reviewed the past medical history, past surgical history, social history and family history with the patient and they are unchanged from previous note.  ALLERGIES:  is allergic to hydrocodone; ace inhibitors; and cephalexin.  MEDICATIONS:  Current Outpatient Prescriptions  Medication Sig Dispense Refill  . acyclovir (ZOVIRAX) 400 MG tablet TAKE 1 TABLET BY MOUTH EVERY DAY 30 tablet 5  . amLODipine (NORVASC) 10 MG tablet TAKE 1 TABLET BY MOUTH EVERY DAY 90 tablet 3  . cholecalciferol (VITAMIN D) 1000 UNITS tablet Take 2,000 Units by mouth every morning.     . [facility] SENNA PLUS 8.6-50 MG per tablet TAKE 2 TABLETS BY MOUTH TWICE A DAY 60 tablet 1  . HYDROmorphone (DILAUDID) 2 MG tablet Take 1 tablet (2 mg total) by mouth every 6 (six) hours as needed for severe pain. 90 tablet 0  . lidocaine-prilocaine (EMLA) cream Apply 1 application topically as needed. 30 g 6  . polyvinyl alcohol (LIQUIFILM TEARS) 1.4 % ophthalmic solution Place 1 drop into both eyes  as needed for dry eyes.     . ixazomib citrate (NINLARO) 4 MG capsule Take on an empty stomach 1hr before  or 2hrs after food. Do not crush, chew or open. Take it once a week for 3 weeks, then rest 1 week 3 capsule 0  . lenalidomide (REVLIMID) 5 MG capsule Take 1 daily for 21 days then off 7 days every 28 days 21 capsule 9   No current facility-administered medications for this visit.    PHYSICAL EXAMINATION: ECOG PERFORMANCE STATUS: 1 - Symptomatic but completely ambulatory  Filed Vitals:   11/19/15 1339  BP: 147/82  Pulse: 98  Temp: 97.9 F (36.6 C)  Resp: 18   Filed Weights   11/19/15 1339  Weight: 102 lb 6.4 oz (46.448 kg)    GENERAL:alert, no distress and comfortable. She looks thin and cachectic SKIN: skin color, texture, turgor are normal, no rashes or significant lesions EYES: normal, Conjunctiva are pink and non-injected, sclera clear Musculoskeletal:no cyanosis of digits and no clubbing  NEURO: alert & oriented x 3 with fluent speech, no focal motor/sensory deficits  LABORATORY DATA:  I have reviewed the data as listed    Component Value Date/Time   NA 143 11/11/2015 1536   NA 143 09/24/2014 0722   K 3.6 11/11/2015 1536   K 3.8 09/24/2014 0722   CL 105 09/24/2014 0722   CO2 25 11/11/2015 1536   CO2 27 09/24/2014 0722   GLUCOSE 82 11/11/2015 1536   GLUCOSE 80 09/24/2014 0722   BUN 16.6 11/11/2015 1536   BUN 17 09/24/2014 0722   CREATININE 1.1 11/11/2015 1536   CREATININE 1.22* 09/24/2014 0722   CALCIUM 10.1 11/11/2015 1536   CALCIUM 8.6 09/24/2014 0722   PROT 7.4 11/11/2015 1536   PROT 7.2 07/13/2014 1222   ALBUMIN 4.2 11/11/2015 1536   ALBUMIN 3.9 07/13/2014 1222   AST 17 11/11/2015 1536   AST 22 07/13/2014 1222   ALT 16 11/11/2015 1536   ALT 75* 07/13/2014 1222   ALKPHOS 66 11/11/2015 1536   ALKPHOS 90 07/13/2014 1222   BILITOT <0.30 11/11/2015 1536   BILITOT 0.4 07/13/2014 1222   GFRNONAA 47* 09/24/2014 0722   GFRAA 54* 09/24/2014 0722    No results found for: SPEP, UPEP  Lab Results  Component Value Date   WBC 6.1 11/11/2015   NEUTROABS 4.1  11/11/2015   HGB 12.5 11/11/2015   HCT 37.6 11/11/2015   MCV 97.4 11/11/2015   PLT 222 11/11/2015      Chemistry      Component Value Date/Time   NA 143 11/11/2015 1536   NA 143 09/24/2014 0722   K 3.6 11/11/2015 1536   K 3.8 09/24/2014 0722   CL 105 09/24/2014 0722   CO2 25 11/11/2015 1536   CO2 27 09/24/2014 0722   BUN 16.6 11/11/2015 1536   BUN 17 09/24/2014 0722   CREATININE 1.1 11/11/2015 1536   CREATININE 1.22* 09/24/2014 0722      Component Value Date/Time   CALCIUM 10.1 11/11/2015 1536   CALCIUM 8.6 09/24/2014 0722   ALKPHOS 66 11/11/2015 1536   ALKPHOS 90 07/13/2014 1222   AST 17 11/11/2015 1536   AST 22 07/13/2014 1222   ALT 16 11/11/2015 1536   ALT 75* 07/13/2014 1222   BILITOT <0.30 11/11/2015 1536   BILITOT 0.4 07/13/2014 1222      ASSESSMENT & PLAN:  Multiple myeloma without remission We have a very long discussion today. Her serum light chains are very  high and IgA level has doubled. She has symptoms of fatigue and increasing bone pain. There is no doubt that her disease have relapsed. The patient appeared depressed and she would really like to spend the Christmas holiday without worrying about her treatment. I discussed with her various treatment options according to current NCCN guidelines. Ultimately, we elected to treat her with combination therapy with dexamethasone weekly, Velcade or Ninlaro weekly along with Revlimid for 21 days/28 days cycle and monthly Zometa. The patient is quite reluctant to resume Revlimid because she felt that it contributed to her fatigue. I recommend combination therapy rather than single agent and would prefer to introduce Revlimid even at a smaller dose. She has obtained dental clearance recently. I will reschedule his Zometa to start on December 30. In the meantime, I will send her prescriptions to get insurance pre-authorization and determine the co-pay. If Ninlaor cost too much, we will use Velcade SQ injection  instead and I will get that pre-certification as well. I will see her back on December 30 for further review. I recommend she start pulse dexamethasone weekly on Fridays.  Bone pain This is slightly worse recently complicated by neuropathic pain. I refill her pain medication prescription recently Per patient request, we will reduce Dilaudid to 2 mg tablet to take as needed. I hope with the introduction of dexamethasone, that would help with her bone pain.    Chronic fatigue She has a significant fatigue. Clinically, she appeared depressed. I recommend pulsed dexamethasone weekly and see we can help with that. I will reassess in 2 weeks. She had TSH done several months ago that was within normal limits.   Orders Placed This Encounter  Procedures  . CBC with Differential    Standing Status: Standing     Number of Occurrences: 20     Standing Expiration Date: 11/19/2016  . Comprehensive metabolic panel    Standing Status: Standing     Number of Occurrences: 20     Standing Expiration Date: 11/19/2016   All questions were answered. The patient knows to call the clinic with any problems, questions or concerns. No barriers to learning was detected. I spent 25 minutes counseling the patient face to face. The total time spent in the appointment was 40 minutes and more than 50% was on counseling and review of test results     [facility], [place], MD 11/19/2015 2:43 PM

## 2015-11-19 NOTE — Assessment & Plan Note (Signed)
She has a significant fatigue. Clinically, she appeared depressed. I recommend pulsed dexamethasone weekly and see we can help with that. I will reassess in 2 weeks. She had TSH done several months ago that was within normal limits.

## 2015-11-19 NOTE — Telephone Encounter (Signed)
New Rxs for Ninlaro and Revlimid faxed to [facility] fax [phone].   Faxed copy of insurance card and copy of [place] [phone].

## 2015-11-22 ENCOUNTER — Encounter: Payer: Self-pay | Admitting: Hematology and Oncology

## 2015-11-22 NOTE — Progress Notes (Signed)
[facility] transferred revlimid and ninlaro prescription to [id number] fax # [phone]

## 2015-11-23 ENCOUNTER — Telehealth: Payer: Self-pay | Admitting: *Deleted

## 2015-11-23 NOTE — Telephone Encounter (Signed)
Note from [place] on 12/19 that [facility] transferred Rx for Ninlaro and Revlimid to [place].   I called [facility] today ph [phone] to check status of Rxs.  [facility] said there is no record of receiving any prescriptions this patient yet.  They might have it and just not documented yet.   I faxed the Rxs along w/ copy of insurance card to [facility] at fax [phone].

## 2015-11-24 ENCOUNTER — Ambulatory Visit: Payer: PRIVATE HEALTH INSURANCE

## 2015-11-24 ENCOUNTER — Ambulatory Visit: Payer: PRIVATE HEALTH INSURANCE | Admitting: Hematology and Oncology

## 2015-11-25 ENCOUNTER — Telehealth: Payer: Self-pay | Admitting: *Deleted

## 2015-11-25 NOTE — Telephone Encounter (Signed)
[place] to check status of new Rxs for Ninlaro and Revlimid.  S/w Mickel Baas who states they have received the Rxs , are working on it and should be contacting pt today.

## 2015-12-01 ENCOUNTER — Encounter: Payer: Self-pay | Admitting: Hematology and Oncology

## 2015-12-01 NOTE — Progress Notes (Signed)
I placed [employer] forms for prior auth for revlimid and ninlaro on desk of dr. Alvy Bimler.

## 2015-12-01 NOTE — Progress Notes (Signed)
I faxed prior auth forms for revlimid and ninlaro to [facility] [phone]

## 2015-12-02 ENCOUNTER — Encounter: Payer: Self-pay | Admitting: Hematology and Oncology

## 2015-12-02 NOTE — Progress Notes (Signed)
Per [facility]  revlimid-PA [id number] and ninlaro*-PA [id number] has been approved until 12.18.17. I sent to medical records.

## 2015-12-03 ENCOUNTER — Other Ambulatory Visit: Payer: Self-pay | Admitting: Hematology and Oncology

## 2015-12-03 ENCOUNTER — Other Ambulatory Visit (HOSPITAL_BASED_OUTPATIENT_CLINIC_OR_DEPARTMENT_OTHER): Payer: 59

## 2015-12-03 ENCOUNTER — Telehealth: Payer: Self-pay | Admitting: Hematology and Oncology

## 2015-12-03 ENCOUNTER — Ambulatory Visit (HOSPITAL_BASED_OUTPATIENT_CLINIC_OR_DEPARTMENT_OTHER): Payer: 59 | Admitting: Hematology and Oncology

## 2015-12-03 ENCOUNTER — Ambulatory Visit (HOSPITAL_BASED_OUTPATIENT_CLINIC_OR_DEPARTMENT_OTHER): Payer: 59

## 2015-12-03 ENCOUNTER — Encounter: Payer: Self-pay | Admitting: Hematology and Oncology

## 2015-12-03 VITALS — BP 137/78 | HR 111

## 2015-12-03 VITALS — BP 126/74 | HR 118 | Temp 98.5°F | Resp 18 | Ht 60.0 in | Wt 101.8 lb

## 2015-12-03 DIAGNOSIS — C9000 Multiple myeloma not having achieved remission: Secondary | ICD-10-CM

## 2015-12-03 DIAGNOSIS — N179 Acute kidney failure, unspecified: Secondary | ICD-10-CM

## 2015-12-03 DIAGNOSIS — G893 Neoplasm related pain (acute) (chronic): Secondary | ICD-10-CM | POA: Diagnosis not present

## 2015-12-03 DIAGNOSIS — M898X9 Other specified disorders of bone, unspecified site: Secondary | ICD-10-CM

## 2015-12-03 LAB — CBC WITH DIFFERENTIAL/PLATELET
BASO%: 0.2 % (ref 0.0–2.0)
Basophils Absolute: 0 10*3/uL (ref 0.0–0.1)
EOS%: 0 % (ref 0.0–7.0)
Eosinophils Absolute: 0 10*3/uL (ref 0.0–0.5)
HCT: 39.2 % (ref 34.8–46.6)
HEMOGLOBIN: 13.1 g/dL (ref 11.6–15.9)
LYMPH%: 2.4 % — AB (ref 14.0–49.7)
MCH: 32.1 pg (ref 25.1–34.0)
MCHC: 33.4 g/dL (ref 31.5–36.0)
MCV: 96.1 fL (ref 79.5–101.0)
MONO#: 0 10*3/uL — ABNORMAL LOW (ref 0.1–0.9)
MONO%: 0.3 % (ref 0.0–14.0)
NEUT%: 97.1 % — ABNORMAL HIGH (ref 38.4–76.8)
NEUTROS ABS: 10.1 10*3/uL — AB (ref 1.5–6.5)
Platelets: 210 10*3/uL (ref 145–400)
RBC: 4.07 10*6/uL (ref 3.70–5.45)
RDW: 13.2 % (ref 11.2–14.5)
WBC: 10.4 10*3/uL — AB (ref 3.9–10.3)
lymph#: 0.3 10*3/uL — ABNORMAL LOW (ref 0.9–3.3)

## 2015-12-03 LAB — COMPREHENSIVE METABOLIC PANEL
ALT: 22 U/L (ref 0–55)
ANION GAP: 11 meq/L (ref 3–11)
AST: 17 U/L (ref 5–34)
Albumin: 4.5 g/dL (ref 3.5–5.0)
Alkaline Phosphatase: 66 U/L (ref 40–150)
BUN: 15 mg/dL (ref 7.0–26.0)
CALCIUM: 11 mg/dL — AB (ref 8.4–10.4)
CHLORIDE: 106 meq/L (ref 98–109)
CO2: 23 meq/L (ref 22–29)
Creatinine: 1.2 mg/dL — ABNORMAL HIGH (ref 0.6–1.1)
EGFR: 50 mL/min/{1.73_m2} — ABNORMAL LOW (ref >90)
Glucose: 155 mg/dl — ABNORMAL HIGH (ref 70–140)
POTASSIUM: 3.6 meq/L (ref 3.5–5.1)
Sodium: 141 mEq/L (ref 136–145)
Total Bilirubin: 0.44 mg/dL (ref 0.20–1.20)
Total Protein: 7.8 g/dL (ref 6.4–8.3)

## 2015-12-03 MED ORDER — HYDROMORPHONE HCL 2 MG PO TABS: 2.0000 mg | ORAL_TABLET | Freq: Four times a day (QID) | ORAL | Status: DC | PRN

## 2015-12-03 MED ORDER — MORPHINE SULFATE ER 15 MG PO TBCR: 15.0000 mg | EXTENDED_RELEASE_TABLET | Freq: Two times a day (BID) | ORAL | Status: DC

## 2015-12-03 MED ORDER — SODIUM CHLORIDE 0.9 % IV SOLN: Freq: Once | INTRAVENOUS | Status: DC

## 2015-12-03 MED ORDER — SODIUM CHLORIDE 0.9 % IV SOLN
INTRAVENOUS | Status: AC
  Administered 2015-12-03: 13:00:00 via INTRAVENOUS

## 2015-12-03 MED ORDER — ZOLEDRONIC ACID 4 MG/100ML IV SOLN
4.0000 mg | Freq: Once | INTRAVENOUS | Status: AC
  Administered 2015-12-03: 4 mg via INTRAVENOUS
  Filled 2015-12-03: qty 100

## 2015-12-03 NOTE — Progress Notes (Signed)
[place] OFFICE PROGRESS NOTE  Patient Care Team: Hulan Fess, MD as PCP - General (Family Medicine) Luanne Bras, MD as Consulting Physician (Interventional Radiology)  SUMMARY OF ONCOLOGIC HISTORY: Oncology History   Multiple myeloma without remission; Calcium 13.3, Creatinine 2.83, Hg 8.3, Bone lesions present, Beta 2 microglobulin 12.8, Albumin 3.5, IgA 1760 and kappa light chains 1670. Bone marrow biopsy showed 82% involvement.   Primary site: Multiple Myeloma   Staging method: AJCC 6th Edition   Clinical: Stage IIIB signed by Heath Lark, MD on 07/13/2014 12:38 PM   Summary: Stage IIIB       Multiple myeloma without remission ([place])   07/07/2014 - 07/10/2014 Hospital Admission She was admitted to the hospital for management of malignant hypercalcemia and had bone marrow biopsy which confirmed diagnosis of multiple myeloma. The patient received one unit of blood transfusion due to severe anemia.   07/08/2014 Bone Marrow Biopsy The patient had bone marrow biopsy that confirmed diagnosis.   07/08/2014 Pathology Results Accession: [id number] showed 82% involvement of bone marrow.   07/08/2014 - 10/19/2014 Chemotherapy She was started on Velcade along with dexamethasone.   08/21/2014 - 10/19/2014 Chemotherapy I added Revlimid and Zometa to her chemotherapy regimen.   09/24/2014 Pathology Results Accession: [id number] biopsy showed plasma cells involvement.   09/24/2014 Procedure she has kyphoplasty performed.   09/30/2014 Bone Marrow Biopsy Accession: [id number] bone marrow biopsy showed residual plasma cell, approximately 11%   11/06/2014 - 05/07/2015 Chemotherapy She was started on Velcade maintenance every other week, treatment is discontinued due to neuropathic pain.   06/01/2015 -  Chemotherapy The patient received Revlimid and start taking it as maintenance therapy.    INTERVAL HISTORY: Please see below for problem oriented charting.  she returns for further  follow-up. She has started pulsed dexamethasone every week on Fridays. She have worsening bone pain. She denies any nausea or vomiting. No recent infection.  REVIEW OF SYSTEMS:   Constitutional: Denies fevers, chills or abnormal weight loss Eyes: Denies blurriness of vision Ears, nose, mouth, throat, and face: Denies mucositis or sore throat Respiratory: Denies cough, dyspnea or wheezes Cardiovascular: Denies palpitation, chest discomfort or lower extremity swelling Gastrointestinal:  Denies nausea, heartburn or change in bowel habits Skin: Denies abnormal skin rashes Lymphatics: Denies new lymphadenopathy or easy bruising Neurological:Denies numbness, tingling or new weaknesses Behavioral/Psych: Mood is stable, no new changes  All other systems were reviewed with the patient and are negative.  I have reviewed the past medical history, past surgical history, social history and family history with the patient and they are unchanged from previous note.  ALLERGIES:  is allergic to hydrocodone; ace inhibitors; and cephalexin.  MEDICATIONS:  Current Outpatient Prescriptions  Medication Sig Dispense Refill  . acyclovir (ZOVIRAX) 400 MG tablet TAKE 1 TABLET BY MOUTH EVERY DAY 30 tablet 5  . amLODipine (NORVASC) 10 MG tablet TAKE 1 TABLET BY MOUTH EVERY DAY 90 tablet 3  . cholecalciferol (VITAMIN D) 1000 UNITS tablet Take 2,000 Units by mouth every morning.     . [facility] SENNA PLUS 8.6-50 MG per tablet TAKE 2 TABLETS BY MOUTH TWICE A DAY 60 tablet 1  . HYDROmorphone (DILAUDID) 2 MG tablet Take 1 tablet (2 mg total) by mouth every 6 (six) hours as needed for severe pain. 90 tablet 0  . ixazomib citrate (NINLARO) 4 MG capsule Take on an empty stomach 1hr before or 2hrs after food. Do not crush, chew or open. Take it once a week for 3  weeks, then rest 1 week 3 capsule 0  . lenalidomide (REVLIMID) 5 MG capsule Take 1 daily for 21 days then off 7 days every 28 days 21 capsule 9  .  lidocaine-prilocaine (EMLA) cream Apply 1 application topically as needed. 30 g 6  . polyvinyl alcohol (LIQUIFILM TEARS) 1.4 % ophthalmic solution Place 1 drop into both eyes as needed for dry eyes.     Marland Kitchen morphine (MS CONTIN) 15 MG 12 hr tablet Take 1 tablet (15 mg total) by mouth every 12 (twelve) hours. 60 tablet 0   No current facility-administered medications for this visit.   Facility-Administered Medications Ordered in Other Visits  Medication Dose Route Frequency Provider Last Rate Last Dose  . 0.9 %  sodium chloride infusion   Intravenous Continuous Heath Lark, MD        PHYSICAL EXAMINATION: ECOG PERFORMANCE STATUS: 1 - Symptomatic but completely ambulatory  Filed Vitals:   12/03/15 1133  BP: 126/74  Pulse: 118  Temp: 98.5 F (36.9 C)  Resp: 18   Filed Weights   12/03/15 1133  Weight: 101 lb 12.8 oz (46.176 kg)    GENERAL:alert, no distress and comfortable. She looks thin and cachectic SKIN: skin color, texture, turgor are normal, no rashes or significant lesions EYES: normal, Conjunctiva are pink and non-injected, sclera clear OROPHARYNX:no exudate, no erythema and lips, buccal mucosa, and tongue normal  Musculoskeletal:no cyanosis of digits and no clubbing  NEURO: alert & oriented x 3 with fluent speech, no focal motor/sensory deficits  LABORATORY DATA:  I have reviewed the data as listed    Component Value Date/Time   NA 141 12/03/2015 1120   NA 143 09/24/2014 0722   K 3.6 12/03/2015 1120   K 3.8 09/24/2014 0722   CL 105 09/24/2014 0722   CO2 23 12/03/2015 1120   CO2 27 09/24/2014 0722   GLUCOSE 155* 12/03/2015 1120   GLUCOSE 80 09/24/2014 0722   BUN 15.0 12/03/2015 1120   BUN 17 09/24/2014 0722   CREATININE 1.2* 12/03/2015 1120   CREATININE 1.22* 09/24/2014 0722   CALCIUM 11.0* 12/03/2015 1120   CALCIUM 8.6 09/24/2014 0722   PROT 7.8 12/03/2015 1120   PROT 7.2 07/13/2014 1222   ALBUMIN 4.5 12/03/2015 1120   ALBUMIN 3.9 07/13/2014 1222   AST 17  12/03/2015 1120   AST 22 07/13/2014 1222   ALT 22 12/03/2015 1120   ALT 75* 07/13/2014 1222   ALKPHOS 66 12/03/2015 1120   ALKPHOS 90 07/13/2014 1222   BILITOT 0.44 12/03/2015 1120   BILITOT 0.4 07/13/2014 1222   GFRNONAA 47* 09/24/2014 0722   GFRAA 54* 09/24/2014 0722    No results found for: SPEP, UPEP  Lab Results  Component Value Date   WBC 10.4* 12/03/2015   NEUTROABS 10.1* 12/03/2015   HGB 13.1 12/03/2015   HCT 39.2 12/03/2015   MCV 96.1 12/03/2015   PLT 210 12/03/2015      Chemistry      Component Value Date/Time   NA 141 12/03/2015 1120   NA 143 09/24/2014 0722   K 3.6 12/03/2015 1120   K 3.8 09/24/2014 0722   CL 105 09/24/2014 0722   CO2 23 12/03/2015 1120   CO2 27 09/24/2014 0722   BUN 15.0 12/03/2015 1120   BUN 17 09/24/2014 0722   CREATININE 1.2* 12/03/2015 1120   CREATININE 1.22* 09/24/2014 0722      Component Value Date/Time   CALCIUM 11.0* 12/03/2015 1120   CALCIUM 8.6 09/24/2014 0722  ALKPHOS 66 12/03/2015 1120   ALKPHOS 90 07/13/2014 1222   AST 17 12/03/2015 1120   AST 22 07/13/2014 1222   ALT 22 12/03/2015 1120   ALT 75* 07/13/2014 1222   BILITOT 0.44 12/03/2015 1120   BILITOT 0.4 07/13/2014 1222     ASSESSMENT & PLAN:  Multiple myeloma without remission  Previously, we had extensive discussion. Ultimately, we agreed to proceed with combination chemotherapy with pulse dexamethasone, Ninlaro and Revlimid. She will continue acyclovir for antimicrobial prophylaxis.  she will take calcium and vitamin D supplement. She will take aspirin. I will give her Zometa today.  her medication has been approved but has not been delivered.  she will come him on a weekly basis for blood work once she starts taking her medication.  Hypercalcemia  This is due to relapsed multiple myeloma. We will start Zometa today. I recommend she take low-dose dexamethasone for week. We will proceed with her chemotherapy treatment hopefully by next week.  I will  proceed to give her a liter of IV fluids today.  Acute renal failure  She has mild acute renal failure, likely related to hypercalcemia and relapsed multiple myeloma. She will get Zometa today and I recommend IV fluids.  Bone pain  She has worsening bone pain. I recommended addition of long-acting morphine sulfate at night time and possibly twice a day to get her better pain control.  we discussed the risks, benefits, side effects of excessive sedation and possible worsening constipation with pain medicine.   No orders of the defined types were placed in this encounter.   All questions were answered. The patient knows to call the clinic with any problems, questions or concerns. No barriers to learning was detected. I spent 20 minutes counseling the patient face to face. The total time spent in the appointment was 25 minutes and more than 50% was on counseling and review of test results     [facility], [place], MD 12/03/2015 12:28 PM

## 2015-12-03 NOTE — Telephone Encounter (Signed)
Scheduled patient per pof, avs report printed.  °

## 2015-12-03 NOTE — Assessment & Plan Note (Signed)
She has mild acute renal failure, likely related to hypercalcemia and relapsed multiple myeloma. She will get Zometa today and I recommend IV fluids.

## 2015-12-03 NOTE — Patient Instructions (Signed)

## 2015-12-03 NOTE — Assessment & Plan Note (Signed)
She has worsening bone pain. I recommended addition of long-acting morphine sulfate at night time and possibly twice a day to get her better pain control.  we discussed the risks, benefits, side effects of excessive sedation and possible worsening constipation with pain medicine.

## 2015-12-03 NOTE — Assessment & Plan Note (Signed)
This is due to relapsed multiple myeloma. We will start Zometa today. I recommend she take low-dose dexamethasone for week. We will proceed with her chemotherapy treatment hopefully by next week.  I will proceed to give her a liter of IV fluids today.

## 2015-12-03 NOTE — Assessment & Plan Note (Signed)
Previously, we had extensive discussion. Ultimately, we agreed to proceed with combination chemotherapy with pulse dexamethasone, Ninlaro and Revlimid. She will continue acyclovir for antimicrobial prophylaxis.  she will take calcium and vitamin D supplement. She will take aspirin. I will give her Zometa today.  her medication has been approved but has not been delivered.  she will come him on a weekly basis for blood work once she starts taking her medication.

## 2015-12-08 ENCOUNTER — Telehealth: Payer: Self-pay | Admitting: *Deleted

## 2015-12-08 NOTE — Telephone Encounter (Signed)
Received call from pt stating that [facility] ninlaro & revlimid & they will be delivered tomorrow & she will begin on Friday.  She thinks she needs to schedule labs for fri 12/17/15.  Message to Dr Robyne Askew

## 2015-12-08 NOTE — Telephone Encounter (Signed)
LVM for pt to return nurse's call.  I am checking on status of Revlimid and Ninlaro.  Asked pt to let us know if she has received them/started them yet and if she needs any help in getting them?  Once she starts medications,  We will need to schedule lab appointment.

## 2015-12-09 ENCOUNTER — Other Ambulatory Visit: Payer: Self-pay | Admitting: *Deleted

## 2015-12-09 ENCOUNTER — Other Ambulatory Visit: Payer: Self-pay | Admitting: Hematology and Oncology

## 2015-12-09 NOTE — Telephone Encounter (Signed)
Done

## 2015-12-14 ENCOUNTER — Telehealth: Payer: Self-pay | Admitting: Hematology and Oncology

## 2015-12-14 NOTE — Telephone Encounter (Signed)
Left message for patient on home phone re lab for 1/13, 1/20 and 1/27. Then called work # and spoke with patient re appointments. Time changed to to 3:15 pm for each lab per patient - patient will get new schedule 1/13.

## 2015-12-17 ENCOUNTER — Telehealth: Payer: Self-pay | Admitting: *Deleted

## 2015-12-17 ENCOUNTER — Other Ambulatory Visit (HOSPITAL_BASED_OUTPATIENT_CLINIC_OR_DEPARTMENT_OTHER): Payer: 59

## 2015-12-17 DIAGNOSIS — C9000 Multiple myeloma not having achieved remission: Secondary | ICD-10-CM | POA: Diagnosis not present

## 2015-12-17 LAB — CBC WITH DIFFERENTIAL/PLATELET
BASO%: 0.1 % (ref 0.0–2.0)
BASOS ABS: 0 10*3/uL (ref 0.0–0.1)
EOS ABS: 0 10*3/uL (ref 0.0–0.5)
EOS%: 0 % (ref 0.0–7.0)
HEMATOCRIT: 36.2 % (ref 34.8–46.6)
HEMOGLOBIN: 11.9 g/dL (ref 11.6–15.9)
LYMPH#: 0.2 10*3/uL — AB (ref 0.9–3.3)
LYMPH%: 3.2 % — ABNORMAL LOW (ref 14.0–49.7)
MCH: 32.6 pg (ref 25.1–34.0)
MCHC: 33 g/dL (ref 31.5–36.0)
MCV: 98.8 fL (ref 79.5–101.0)
MONO#: 0 10*3/uL — AB (ref 0.1–0.9)
MONO%: 0.5 % (ref 0.0–14.0)
NEUT%: 96.2 % — ABNORMAL HIGH (ref 38.4–76.8)
NEUTROS ABS: 7.2 10*3/uL — AB (ref 1.5–6.5)
Platelets: 196 10*3/uL (ref 145–400)
RBC: 3.66 10*6/uL — ABNORMAL LOW (ref 3.70–5.45)
RDW: 13.3 % (ref 11.2–14.5)
WBC: 7.5 10*3/uL (ref 3.9–10.3)

## 2015-12-17 LAB — COMPREHENSIVE METABOLIC PANEL
ALBUMIN: 4 g/dL (ref 3.5–5.0)
ALK PHOS: 59 U/L (ref 40–150)
ALT: 21 U/L (ref 0–55)
AST: 15 U/L (ref 5–34)
Anion Gap: 12 mEq/L — ABNORMAL HIGH (ref 3–11)
BILIRUBIN TOTAL: 0.33 mg/dL (ref 0.20–1.20)
BUN: 21.8 mg/dL (ref 7.0–26.0)
CALCIUM: 10.3 mg/dL (ref 8.4–10.4)
CO2: 25 mEq/L (ref 22–29)
Chloride: 103 mEq/L (ref 98–109)
Creatinine: 1.4 mg/dL — ABNORMAL HIGH (ref 0.6–1.1)
EGFR: 40 mL/min/{1.73_m2} — AB (ref >90)
GLUCOSE: 167 mg/dL — AB (ref 70–140)
POTASSIUM: 4.1 meq/L (ref 3.5–5.1)
SODIUM: 140 meq/L (ref 136–145)
TOTAL PROTEIN: 7.2 g/dL (ref 6.4–8.3)

## 2015-12-17 NOTE — Telephone Encounter (Signed)
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 Alvy Bimler and she replies; 1. Was instructed to hold her Aspirin for this? If so,  then make sure she also holds Revlimid. 2. Is she taking Dexamethasone daily? If so then decrease dose in half. 3. Inform pt Labs are stable today.

## 2015-12-17 NOTE — Telephone Encounter (Signed)
Unable to reach pt on work, home or cell phone.  Left detailed message on her cell phone informing her of Dr. Calton Dach message below and asked her to call nurse back on Monday to confirm she got our message.

## 2015-12-20 ENCOUNTER — Telehealth: Payer: Self-pay | Admitting: *Deleted

## 2015-12-20 ENCOUNTER — Other Ambulatory Visit: Payer: Self-pay | Admitting: Hematology and Oncology

## 2015-12-20 NOTE — Telephone Encounter (Signed)
Pt left VM for nurse this morning.  She responded to message nurse left her on Friday.  She informs she continues to take Revlimid and Aspirin as prescribed.  She is taking Dexamethasone 5 tablets once a week.  Asks if Dr. Alvy Bimler wants her to reduce dose? She also reports "severe constipation" last weekend and "diarrhea" this weekend.  She having more frequent headaches and requests refill on Fioricet.  Called pt back and LVM instructing per Dr. Alvy Bimler to continue Revlimid and Aspirin as directed.  Also continue Dexamethasone same dose once weekly.  Informed her of refill on [facility]et called into [facility] [address]  Asked her to call nurse back if any questions or concerns.

## 2015-12-24 ENCOUNTER — Telehealth: Payer: Self-pay | Admitting: *Deleted

## 2015-12-24 ENCOUNTER — Other Ambulatory Visit (HOSPITAL_BASED_OUTPATIENT_CLINIC_OR_DEPARTMENT_OTHER): Payer: 59

## 2015-12-24 DIAGNOSIS — C9000 Multiple myeloma not having achieved remission: Secondary | ICD-10-CM | POA: Diagnosis not present

## 2015-12-24 LAB — COMPREHENSIVE METABOLIC PANEL
ALBUMIN: 3.8 g/dL (ref 3.5–5.0)
ALT: 26 U/L (ref 0–55)
ANION GAP: 9 meq/L (ref 3–11)
AST: 17 U/L (ref 5–34)
Alkaline Phosphatase: 68 U/L (ref 40–150)
BUN: 16.8 mg/dL (ref 7.0–26.0)
CALCIUM: 9.2 mg/dL (ref 8.4–10.4)
CO2: 26 mEq/L (ref 22–29)
CREATININE: 1.3 mg/dL — AB (ref 0.6–1.1)
Chloride: 104 mEq/L (ref 98–109)
EGFR: 46 mL/min/{1.73_m2} — ABNORMAL LOW (ref >90)
Glucose: 134 mg/dl (ref 70–140)
Potassium: 3.1 mEq/L — ABNORMAL LOW (ref 3.5–5.1)
SODIUM: 140 meq/L (ref 136–145)
TOTAL PROTEIN: 6.7 g/dL (ref 6.4–8.3)

## 2015-12-24 LAB — CBC WITH DIFFERENTIAL/PLATELET
BASO%: 0.1 % (ref 0.0–2.0)
BASOS ABS: 0 10*3/uL (ref 0.0–0.1)
EOS%: 0 % (ref 0.0–7.0)
Eosinophils Absolute: 0 10*3/uL (ref 0.0–0.5)
HEMATOCRIT: 35.2 % (ref 34.8–46.6)
HEMOGLOBIN: 11.8 g/dL (ref 11.6–15.9)
LYMPH%: 3.1 % — AB (ref 14.0–49.7)
MCH: 32.3 pg (ref 25.1–34.0)
MCHC: 33.4 g/dL (ref 31.5–36.0)
MCV: 96.9 fL (ref 79.5–101.0)
MONO#: 0.1 10*3/uL (ref 0.1–0.9)
MONO%: 1.7 % (ref 0.0–14.0)
NEUT#: 6.2 10*3/uL (ref 1.5–6.5)
NEUT%: 95.1 % — AB (ref 38.4–76.8)
PLATELETS: 131 10*3/uL — AB (ref 145–400)
RBC: 3.63 10*6/uL — ABNORMAL LOW (ref 3.70–5.45)
RDW: 13.1 % (ref 11.2–14.5)
WBC: 6.6 10*3/uL (ref 3.9–10.3)
lymph#: 0.2 10*3/uL — ABNORMAL LOW (ref 0.9–3.3)

## 2015-12-24 MED ORDER — POTASSIUM CHLORIDE CRYS ER 20 MEQ PO TBCR: 20.0000 meq | EXTENDED_RELEASE_TABLET | Freq: Every day | ORAL | Status: DC

## 2015-12-24 NOTE — Telephone Encounter (Signed)
K+ 3.1.  Instructed pt Dr. Alvy Bimler ordered Potassium 20 Meq once daily x 7 days.  Rx sent to her pharmacy.  Also instructed to try to eat potassium rich diet such as avocado, sweet potatoes, fruit juice.  Other labs stable and ok to continue Revlimid/ Ninlaro as ordered.   Pt states understanding.  States K+ may be low d/t diarrhea last weekend after taking laxatives for constipation.  She feels like that is "worked out now."     She taking last [employer] today,  Takes one week off and then resumes. Will need refill sent to [facility].

## 2015-12-27 ENCOUNTER — Telehealth: Payer: Self-pay | Admitting: *Deleted

## 2015-12-27 NOTE — Telephone Encounter (Signed)
Pt called wanting to know about a "potassium shot" states she vomited after taking kdur. But, states she was already nauseated from the ninlaro. Will try to take potassium pill again on Wednesday night and let us know how she does. Informed of length of IV potassium infusion, pt does not want to take IV. Dr Alvy Bimler offered to bring patient in this week to discuss options for treatment. Pt will see how she is doing on Wednesday and let us know if she needs to be seen

## 2015-12-29 ENCOUNTER — Other Ambulatory Visit: Payer: Self-pay | Admitting: *Deleted

## 2015-12-29 DIAGNOSIS — C9000 Multiple myeloma not having achieved remission: Secondary | ICD-10-CM

## 2015-12-29 MED ORDER — MORPHINE SULFATE ER 15 MG PO TBCR: 15.0000 mg | EXTENDED_RELEASE_TABLET | Freq: Two times a day (BID) | ORAL | Status: DC

## 2015-12-29 MED ORDER — HYDROMORPHONE HCL 2 MG PO TABS: 2.0000 mg | ORAL_TABLET | Freq: Four times a day (QID) | ORAL | Status: DC | PRN

## 2015-12-29 NOTE — Telephone Encounter (Signed)
Pt left VM states she was able to keep her Potassium pill down last night and hopefully will continue to be able to get it down for the next 4 to 5 days.  She reports ongoing "severe constipation" during the week and "severe diarrhea" on the weekends.  Says her Neuropathy pain is improving over past several weeks.  She also requests refill on her "pain medication." Called pt back and left VM informing per Dr. Alvy Bimler; 1.  Hold Ninlaro and Revlimid until she sees Dr. Alvy Bimler again next Friday on 2/3 (due to severe diarrhea and constipation). 2.  Rx for Dilaudid and Morphine ready to pick up. 3.  We can move her lab appt from this Friday to earlier if she needs to come in for her Rxs earlier than Friday when her lab is scheduled.  Lab can be done today or tomorrow if she needs to come in earlier to pick up Rxs. Asked pt to return nurse's call to confirm she got message and let us know if she wants to move her lab appt earlier.

## 2015-12-29 NOTE — Telephone Encounter (Signed)
Pt returned call to confirm she got my message w/ instructions below.  She also says she is going to wait to pick up Rx until her lab this Friday.  No need to move lab appt earlier.

## 2015-12-31 ENCOUNTER — Other Ambulatory Visit (HOSPITAL_BASED_OUTPATIENT_CLINIC_OR_DEPARTMENT_OTHER): Payer: 59

## 2015-12-31 DIAGNOSIS — C9000 Multiple myeloma not having achieved remission: Secondary | ICD-10-CM | POA: Diagnosis not present

## 2015-12-31 LAB — CBC WITH DIFFERENTIAL/PLATELET
BASO%: 0.6 % (ref 0.0–2.0)
Basophils Absolute: 0 10*3/uL (ref 0.0–0.1)
EOS%: 1.2 % (ref 0.0–7.0)
Eosinophils Absolute: 0.1 10*3/uL (ref 0.0–0.5)
HEMATOCRIT: 33.3 % — AB (ref 34.8–46.6)
HGB: 10.9 g/dL — ABNORMAL LOW (ref 11.6–15.9)
LYMPH#: 1 10*3/uL (ref 0.9–3.3)
LYMPH%: 20.6 % (ref 14.0–49.7)
MCH: 32.5 pg (ref 25.1–34.0)
MCHC: 32.6 g/dL (ref 31.5–36.0)
MCV: 99.7 fL (ref 79.5–101.0)
MONO#: 0.8 10*3/uL (ref 0.1–0.9)
MONO%: 15.8 % — ABNORMAL HIGH (ref 0.0–14.0)
NEUT%: 61.8 % (ref 38.4–76.8)
NEUTROS ABS: 2.9 10*3/uL (ref 1.5–6.5)
Platelets: 293 10*3/uL (ref 145–400)
RBC: 3.34 10*6/uL — AB (ref 3.70–5.45)
RDW: 13.6 % (ref 11.2–14.5)
WBC: 4.7 10*3/uL (ref 3.9–10.3)

## 2015-12-31 LAB — COMPREHENSIVE METABOLIC PANEL
ALT: 17 U/L (ref 0–55)
ANION GAP: 7 meq/L (ref 3–11)
AST: 16 U/L (ref 5–34)
Albumin: 3.6 g/dL (ref 3.5–5.0)
Alkaline Phosphatase: 54 U/L (ref 40–150)
BUN: 18.7 mg/dL (ref 7.0–26.0)
CHLORIDE: 105 meq/L (ref 98–109)
CO2: 30 meq/L — AB (ref 22–29)
Calcium: 9.8 mg/dL (ref 8.4–10.4)
Creatinine: 1.4 mg/dL — ABNORMAL HIGH (ref 0.6–1.1)
EGFR: 39 mL/min/{1.73_m2} — AB (ref >90)
Glucose: 89 mg/dl (ref 70–140)
POTASSIUM: 4.5 meq/L (ref 3.5–5.1)
Sodium: 142 mEq/L (ref 136–145)
TOTAL PROTEIN: 6.3 g/dL — AB (ref 6.4–8.3)
Total Bilirubin: <0.3 mg/dL (ref 0.20–1.20)

## 2016-01-07 ENCOUNTER — Telehealth: Payer: Self-pay | Admitting: Hematology and Oncology

## 2016-01-07 ENCOUNTER — Encounter: Payer: Self-pay | Admitting: Hematology and Oncology

## 2016-01-07 ENCOUNTER — Ambulatory Visit (HOSPITAL_BASED_OUTPATIENT_CLINIC_OR_DEPARTMENT_OTHER): Payer: 59 | Admitting: Hematology and Oncology

## 2016-01-07 ENCOUNTER — Other Ambulatory Visit: Payer: Self-pay

## 2016-01-07 ENCOUNTER — Other Ambulatory Visit: Payer: Self-pay | Admitting: *Deleted

## 2016-01-07 ENCOUNTER — Other Ambulatory Visit (HOSPITAL_BASED_OUTPATIENT_CLINIC_OR_DEPARTMENT_OTHER): Payer: 59

## 2016-01-07 VITALS — BP 142/73 | HR 71 | Temp 98.7°F | Resp 18 | Wt 103.3 lb

## 2016-01-07 DIAGNOSIS — G893 Neoplasm related pain (acute) (chronic): Secondary | ICD-10-CM | POA: Diagnosis not present

## 2016-01-07 DIAGNOSIS — K5909 Other constipation: Secondary | ICD-10-CM | POA: Diagnosis not present

## 2016-01-07 DIAGNOSIS — C9000 Multiple myeloma not having achieved remission: Secondary | ICD-10-CM

## 2016-01-07 DIAGNOSIS — M898X9 Other specified disorders of bone, unspecified site: Secondary | ICD-10-CM

## 2016-01-07 LAB — CBC WITH DIFFERENTIAL/PLATELET
BASO%: 0.7 % (ref 0.0–2.0)
BASOS ABS: 0 10*3/uL (ref 0.0–0.1)
EOS ABS: 0 10*3/uL (ref 0.0–0.5)
EOS%: 0.5 % (ref 0.0–7.0)
HCT: 35 % (ref 34.8–46.6)
HEMOGLOBIN: 11.6 g/dL (ref 11.6–15.9)
LYMPH%: 19.3 % (ref 14.0–49.7)
MCH: 33 pg (ref 25.1–34.0)
MCHC: 33.1 g/dL (ref 31.5–36.0)
MCV: 99.4 fL (ref 79.5–101.0)
MONO#: 0.7 10*3/uL (ref 0.1–0.9)
MONO%: 12.4 % (ref 0.0–14.0)
NEUT#: 3.7 10*3/uL (ref 1.5–6.5)
NEUT%: 67.1 % (ref 38.4–76.8)
PLATELETS: 262 10*3/uL (ref 145–400)
RBC: 3.52 10*6/uL — AB (ref 3.70–5.45)
RDW: 12.9 % (ref 11.2–14.5)
WBC: 5.6 10*3/uL (ref 3.9–10.3)
lymph#: 1.1 10*3/uL (ref 0.9–3.3)

## 2016-01-07 LAB — COMPREHENSIVE METABOLIC PANEL
ALK PHOS: 55 U/L (ref 40–150)
ALT: 16 U/L (ref 0–55)
AST: 19 U/L (ref 5–34)
Albumin: 3.9 g/dL (ref 3.5–5.0)
Anion Gap: 11 mEq/L (ref 3–11)
BUN: 15.1 mg/dL (ref 7.0–26.0)
CHLORIDE: 105 meq/L (ref 98–109)
CO2: 27 meq/L (ref 22–29)
Calcium: 10.5 mg/dL — ABNORMAL HIGH (ref 8.4–10.4)
Creatinine: 1.3 mg/dL — ABNORMAL HIGH (ref 0.6–1.1)
EGFR: 45 mL/min/{1.73_m2} — AB (ref >90)
GLUCOSE: 97 mg/dL (ref 70–140)
POTASSIUM: 3.7 meq/L (ref 3.5–5.1)
SODIUM: 143 meq/L (ref 136–145)
Total Bilirubin: <0.3 mg/dL (ref 0.20–1.20)
Total Protein: 6.9 g/dL (ref 6.4–8.3)

## 2016-01-07 MED ORDER — LENALIDOMIDE 5 MG PO CAPS: ORAL_CAPSULE | ORAL | Status: DC

## 2016-01-07 NOTE — Assessment & Plan Note (Signed)
She has mild hypercalcemia but is suspect is due to dehydration. I recommend she continue on weekly dexamethasone increase oral fluid intake

## 2016-01-07 NOTE — Assessment & Plan Note (Signed)
She have chest wall pain. ECG showed no evidence of cardiac related ischemia. I reassured the patient and recommended her to continue to take her pain medicine as needed

## 2016-01-07 NOTE — Assessment & Plan Note (Signed)
She had recent constipation, resolved with laxative. She will continue to take stool softener as needed

## 2016-01-07 NOTE — Telephone Encounter (Signed)
Gave patient appointments for 2/17. Patient declines print out.

## 2016-01-07 NOTE — Assessment & Plan Note (Signed)
She tolerated recent treatment poorly with profound side effects with diarrhea, excessive fatigue and recent poor appetite. Most of the symptoms have resolved. I recommend with hold NinLaro and continue on weekly dexamethasone and Revlimid only. I will reassess in 2 weeks for side effects

## 2016-01-07 NOTE — Progress Notes (Signed)
[place] OFFICE PROGRESS NOTE  Patient Care Team: Hulan Fess, MD as PCP - General (Family Medicine) Luanne Bras, MD as Consulting Physician (Interventional Radiology) Webb Laws, OD as Consulting Physician (Optometry)  SUMMARY OF ONCOLOGIC HISTORY: Oncology History   Multiple myeloma without remission; Calcium 13.3, Creatinine 2.83, Hg 8.3, Bone lesions present, Beta 2 microglobulin 12.8, Albumin 3.5, IgA 1760 and kappa light chains 1670. Bone marrow biopsy showed 82% involvement.   Primary site: Multiple Myeloma   Staging method: AJCC 6th Edition   Clinical: Stage IIIB signed by Heath Lark, MD on 07/13/2014 12:38 PM   Summary: Stage IIIB       Multiple myeloma without remission ([place])   07/07/2014 - 07/10/2014 Hospital Admission She was admitted to the hospital for management of malignant hypercalcemia and had bone marrow biopsy which confirmed diagnosis of multiple myeloma. The patient received one unit of blood transfusion due to severe anemia.   07/08/2014 Bone Marrow Biopsy The patient had bone marrow biopsy that confirmed diagnosis.   07/08/2014 Pathology Results Accession: [id number] showed 82% involvement of bone marrow.   07/08/2014 - 10/19/2014 Chemotherapy She was started on Velcade along with dexamethasone.   08/21/2014 - 10/19/2014 Chemotherapy I added Revlimid and Zometa to her chemotherapy regimen.   09/24/2014 Pathology Results Accession: [id number] biopsy showed plasma cells involvement.   09/24/2014 Procedure she has kyphoplasty performed.   09/30/2014 Bone Marrow Biopsy Accession: [id number] bone marrow biopsy showed residual plasma cell, approximately 11%   11/06/2014 - 05/07/2015 Chemotherapy She was started on Velcade maintenance every other week, treatment is discontinued due to neuropathic pain.   06/01/2015 -  Chemotherapy The patient received Revlimid and start taking it as maintenance therapy.    INTERVAL HISTORY: Please see below for  problem oriented charting. She returns for further follow-up. Recently, we have to discontinue her treatment due to profound diarrhea. Diarrhea have resolved and she had mild constipation. She has some unusual chest pain over the retrosternal area which sometimes radiating to the left jaw. It is complained of sharp and lingering pressure. She denies diaphoresis or shortness of breath. Her appetite is poor.  REVIEW OF SYSTEMS:   Constitutional: Denies fevers, chills or abnormal weight loss Eyes: Denies blurriness of vision Ears, nose, mouth, throat, and face: Denies mucositis or sore throat Respiratory: Denies cough, dyspnea or wheezes Skin: Denies abnormal skin rashes Lymphatics: Denies new lymphadenopathy or easy bruising Neurological:Denies numbness, tingling or new weaknesses Behavioral/Psych: Mood is stable, no new changes  All other systems were reviewed with the patient and are negative.  I have reviewed the past medical history, past surgical history, social history and family history with the patient and they are unchanged from previous note.  ALLERGIES:  is allergic to hydrocodone; ace inhibitors; and cephalexin.  MEDICATIONS:  Current Outpatient Prescriptions  Medication Sig Dispense Refill  . acyclovir (ZOVIRAX) 400 MG tablet TAKE 1 TABLET BY MOUTH EVERY DAY 30 tablet 5  . amLODipine (NORVASC) 10 MG tablet TAKE 1 TABLET BY MOUTH EVERY DAY 90 tablet 3  . butalbital-acetaminophen-caffeine (FIORICET, ESGIC) 50-325-40 MG tablet TAKE 1 TABLET BY MOUTH EVERY 6 HOURS AS NEEDED FOR HEADACHE 30 tablet 0  . cholecalciferol (VITAMIN D) 1000 UNITS tablet Take 2,000 Units by mouth every morning.     . [facility] SENNA PLUS 8.6-50 MG per tablet TAKE 2 TABLETS BY MOUTH TWICE A DAY 60 tablet 1  . HYDROmorphone (DILAUDID) 2 MG tablet Take 1 tablet (2 mg total) by mouth every 6 (  six) hours as needed for severe pain. 90 tablet 0  . ixazomib citrate (NINLARO) 4 MG capsule Take on an empty stomach  1hr before or 2hrs after food. Do not crush, chew or open. Take it once a week for 3 weeks, then rest 1 week 3 capsule 0  . lidocaine-prilocaine (EMLA) cream Apply 1 application topically as needed. 30 g 6  . morphine (MS CONTIN) 15 MG 12 hr tablet Take 1 tablet (15 mg total) by mouth every 12 (twelve) hours. 60 tablet 0  . polyvinyl alcohol (LIQUIFILM TEARS) 1.4 % ophthalmic solution Place 1 drop into both eyes as needed for dry eyes.     . potassium chloride SA (K-DUR,KLOR-CON) 20 MEQ tablet Take 1 tablet (20 mEq total) by mouth daily. 7 tablet 0  . lenalidomide (REVLIMID) 5 MG capsule Take 1 daily for 21 days then off 7 days every 28 days 21 capsule 9   No current facility-administered medications for this visit.    PHYSICAL EXAMINATION: ECOG PERFORMANCE STATUS: 1 - Symptomatic but completely ambulatory  Filed Vitals:   01/07/16 1518  BP: 142/73  Pulse: 71  Temp: 98.7 F (37.1 C)  Resp: 18   Filed Weights   01/07/16 1518  Weight: 103 lb 4.8 oz (46.857 kg)    GENERAL:alert, no distress and comfortable. She looks thin and cachectic SKIN: skin color, texture, turgor are normal, no rashes or significant lesions EYES: normal, Conjunctiva are pink and non-injected, sclera clear OROPHARYNX:no exudate, no erythema and lips, buccal mucosa, and tongue normal  NECK: supple, thyroid normal size, non-tender, without nodularity LYMPH:  no palpable lymphadenopathy in the cervical, axillary or inguinal LUNGS: clear to auscultation and percussion with normal breathing effort HEART: regular rate & rhythm and no murmurs and no lower extremity edema ABDOMEN:abdomen soft, non-tender and normal bowel sounds Musculoskeletal:no cyanosis of digits and no clubbing . She have reproducible chest wall tenderness near the sternum region likely related to multiple myeloma and not cardiac chest pain NEURO: alert & oriented x 3 with fluent speech, no focal motor/sensory deficits  LABORATORY DATA:  I have  reviewed the data as listed    Component Value Date/Time   NA 143 01/07/2016 1502   NA 143 09/24/2014 0722   K 3.7 01/07/2016 1502   K 3.8 09/24/2014 0722   CL 105 09/24/2014 0722   CO2 27 01/07/2016 1502   CO2 27 09/24/2014 0722   GLUCOSE 97 01/07/2016 1502   GLUCOSE 80 09/24/2014 0722   BUN 15.1 01/07/2016 1502   BUN 17 09/24/2014 0722   CREATININE 1.3* 01/07/2016 1502   CREATININE 1.22* 09/24/2014 0722   CALCIUM 10.5* 01/07/2016 1502   CALCIUM 8.6 09/24/2014 0722   PROT 6.9 01/07/2016 1502   PROT 7.2 07/13/2014 1222   ALBUMIN 3.9 01/07/2016 1502   ALBUMIN 3.9 07/13/2014 1222   AST 19 01/07/2016 1502   AST 22 07/13/2014 1222   ALT 16 01/07/2016 1502   ALT 75* 07/13/2014 1222   ALKPHOS 55 01/07/2016 1502   ALKPHOS 90 07/13/2014 1222   BILITOT <0.30 01/07/2016 1502   BILITOT 0.4 07/13/2014 1222   GFRNONAA 47* 09/24/2014 0722   GFRAA 54* 09/24/2014 0722    No results found for: SPEP, UPEP  Lab Results  Component Value Date   WBC 5.6 01/07/2016   NEUTROABS 3.7 01/07/2016   HGB 11.6 01/07/2016   HCT 35.0 01/07/2016   MCV 99.4 01/07/2016   PLT 262 01/07/2016      Chemistry  Component Value Date/Time   NA 143 01/07/2016 1502   NA 143 09/24/2014 0722   K 3.7 01/07/2016 1502   K 3.8 09/24/2014 0722   CL 105 09/24/2014 0722   CO2 27 01/07/2016 1502   CO2 27 09/24/2014 0722   BUN 15.1 01/07/2016 1502   BUN 17 09/24/2014 0722   CREATININE 1.3* 01/07/2016 1502   CREATININE 1.22* 09/24/2014 0722      Component Value Date/Time   CALCIUM 10.5* 01/07/2016 1502   CALCIUM 8.6 09/24/2014 0722   ALKPHOS 55 01/07/2016 1502   ALKPHOS 90 07/13/2014 1222   AST 19 01/07/2016 1502   AST 22 07/13/2014 1222   ALT 16 01/07/2016 1502   ALT 75* 07/13/2014 1222   BILITOT <0.30 01/07/2016 1502   BILITOT 0.4 07/13/2014 1222     ASSESSMENT & PLAN:  Multiple myeloma without remission She tolerated recent treatment poorly with profound side effects with diarrhea, excessive  fatigue and recent poor appetite. Most of the symptoms have resolved. I recommend with hold NinLaro and continue on weekly dexamethasone and Revlimid only. I will reassess in 2 weeks for side effects  Hypercalcemia She has mild hypercalcemia but is suspect is due to dehydration. I recommend she continue on weekly dexamethasone increase oral fluid intake  Bone pain She have chest wall pain. ECG showed no evidence of cardiac related ischemia. I reassured the patient and recommended her to continue to take her pain medicine as needed  Other constipation She had recent constipation, resolved with laxative. She will continue to take stool softener as needed   No orders of the defined types were placed in this encounter.   All questions were answered. The patient knows to call the clinic with any problems, questions or concerns. No barriers to learning was detected. I spent 20 minutes counseling the patient face to face. The total time spent in the appointment was 25 minutes and more than 50% was on counseling and review of test results     [facility], Junia Nygren, MD 01/07/2016 5:32 PM

## 2016-01-10 ENCOUNTER — Other Ambulatory Visit: Payer: Self-pay | Admitting: Hematology and Oncology

## 2016-01-10 DIAGNOSIS — C9000 Multiple myeloma not having achieved remission: Secondary | ICD-10-CM

## 2016-01-21 ENCOUNTER — Encounter: Payer: Self-pay | Admitting: Hematology and Oncology

## 2016-01-21 ENCOUNTER — Telehealth: Payer: Self-pay | Admitting: Hematology and Oncology

## 2016-01-21 ENCOUNTER — Ambulatory Visit (HOSPITAL_COMMUNITY)
Admission: RE | Admit: 2016-01-21 | Discharge: 2016-01-21 | Disposition: A | Discharge status: Home / Self Care | Payer: 59 | Source: Ambulatory Visit | Attending: Hematology and Oncology | Admitting: Hematology and Oncology

## 2016-01-21 ENCOUNTER — Ambulatory Visit (HOSPITAL_BASED_OUTPATIENT_CLINIC_OR_DEPARTMENT_OTHER): Payer: 59 | Admitting: Hematology and Oncology

## 2016-01-21 ENCOUNTER — Other Ambulatory Visit (HOSPITAL_BASED_OUTPATIENT_CLINIC_OR_DEPARTMENT_OTHER): Payer: 59

## 2016-01-21 VITALS — BP 131/77 | HR 95 | Temp 98.1°F | Resp 18 | Ht 60.0 in | Wt 102.8 lb

## 2016-01-21 DIAGNOSIS — M8580 Other specified disorders of bone density and structure, unspecified site: Secondary | ICD-10-CM | POA: Diagnosis not present

## 2016-01-21 DIAGNOSIS — C9000 Multiple myeloma not having achieved remission: Secondary | ICD-10-CM | POA: Insufficient documentation

## 2016-01-21 DIAGNOSIS — M899 Disorder of bone, unspecified: Secondary | ICD-10-CM

## 2016-01-21 DIAGNOSIS — H538 Other visual disturbances: Secondary | ICD-10-CM | POA: Insufficient documentation

## 2016-01-21 DIAGNOSIS — M25572 Pain in left ankle and joints of left foot: Secondary | ICD-10-CM

## 2016-01-21 DIAGNOSIS — M898X9 Other specified disorders of bone, unspecified site: Secondary | ICD-10-CM

## 2016-01-21 DIAGNOSIS — M1712 Unilateral primary osteoarthritis, left knee: Secondary | ICD-10-CM | POA: Diagnosis not present

## 2016-01-21 HISTORY — DX: Pain in left ankle and joints of left foot: M25.572

## 2016-01-21 LAB — CBC WITH DIFFERENTIAL/PLATELET
BASO%: 0 % (ref 0.0–2.0)
BASOS ABS: 0 10*3/uL (ref 0.0–0.1)
EOS ABS: 0 10*3/uL (ref 0.0–0.5)
EOS%: 0 % (ref 0.0–7.0)
HCT: 35.8 % (ref 34.8–46.6)
HGB: 11.9 g/dL (ref 11.6–15.9)
LYMPH%: 3.4 % — AB (ref 14.0–49.7)
MCH: 32.9 pg (ref 25.1–34.0)
MCHC: 33.2 g/dL (ref 31.5–36.0)
MCV: 98.9 fL (ref 79.5–101.0)
MONO#: 0.1 10*3/uL (ref 0.1–0.9)
MONO%: 0.6 % (ref 0.0–14.0)
NEUT%: 96 % — ABNORMAL HIGH (ref 38.4–76.8)
NEUTROS ABS: 7.4 10*3/uL — AB (ref 1.5–6.5)
Platelets: 198 10*3/uL (ref 145–400)
RBC: 3.62 10*6/uL — AB (ref 3.70–5.45)
RDW: 12.8 % (ref 11.2–14.5)
WBC: 7.7 10*3/uL (ref 3.9–10.3)
lymph#: 0.3 10*3/uL — ABNORMAL LOW (ref 0.9–3.3)

## 2016-01-21 LAB — COMPREHENSIVE METABOLIC PANEL
ALT: 25 U/L (ref 0–55)
ANION GAP: 11 meq/L (ref 3–11)
AST: 22 U/L (ref 5–34)
Albumin: 3.9 g/dL (ref 3.5–5.0)
Alkaline Phosphatase: 61 U/L (ref 40–150)
BUN: 19.7 mg/dL (ref 7.0–26.0)
CHLORIDE: 106 meq/L (ref 98–109)
CO2: 23 meq/L (ref 22–29)
CREATININE: 1.4 mg/dL — AB (ref 0.6–1.1)
Calcium: 9.7 mg/dL (ref 8.4–10.4)
EGFR: 39 mL/min/{1.73_m2} — ABNORMAL LOW (ref >90)
GLUCOSE: 146 mg/dL — AB (ref 70–140)
Potassium: 3.7 mEq/L (ref 3.5–5.1)
SODIUM: 140 meq/L (ref 136–145)
TOTAL PROTEIN: 6.8 g/dL (ref 6.4–8.3)
Total Bilirubin: <0.3 mg/dL (ref 0.20–1.20)

## 2016-01-21 MED ORDER — HYDROMORPHONE HCL 2 MG PO TABS: 2.0000 mg | ORAL_TABLET | Freq: Four times a day (QID) | ORAL | Status: DC | PRN

## 2016-01-21 NOTE — Assessment & Plan Note (Signed)
She have chest wall pain and ankle pain related to myeloma ECG showed no evidence of cardiac related ischemia. I reassured the patient and recommended her to continue to take her pain medicine as needed

## 2016-01-21 NOTE — Assessment & Plan Note (Signed)
She has severe left ankle pain. I am concerned about possible fracture from myeloma. I will order additional x-rays for further evaluation

## 2016-01-21 NOTE — Assessment & Plan Note (Signed)
She has seen an ophthalmologist and was told that she may have age associated floaters There were no evidence of retinal detachment Continue conservative management

## 2016-01-21 NOTE — Progress Notes (Signed)
[name] OFFICE PROGRESS NOTE  Patient Care Team: Hulan Fess, MD as PCP - General (Family Medicine) Luanne Bras, MD as Consulting Physician (Interventional Radiology) Webb Laws, OD as Consulting Physician (Optometry)  SUMMARY OF ONCOLOGIC HISTORY: Oncology History   Multiple myeloma without remission; Calcium 13.3, Creatinine 2.83, Hg 8.3, Bone lesions present, Beta 2 microglobulin 12.8, Albumin 3.5, IgA 1760 and kappa light chains 1670. Bone marrow biopsy showed 82% involvement.   Primary site: Multiple Myeloma   Staging method: AJCC 6th Edition   Clinical: Stage IIIB signed by Heath Lark, MD on 07/13/2014 12:38 PM   Summary: Stage IIIB       Multiple myeloma without remission ([name])   07/07/2014 - 07/10/2014 Hospital Admission She was admitted to the hospital for management of malignant hypercalcemia and had bone marrow biopsy which confirmed diagnosis of multiple myeloma. The patient received one unit of blood transfusion due to severe anemia.   07/08/2014 Bone Marrow Biopsy The patient had bone marrow biopsy that confirmed diagnosis.   07/08/2014 Pathology Results Accession: [id number] showed 82% involvement of bone marrow.   07/08/2014 - 10/19/2014 Chemotherapy She was started on Velcade along with dexamethasone.   08/21/2014 - 10/19/2014 Chemotherapy I added Revlimid and Zometa to her chemotherapy regimen.   09/24/2014 Pathology Results Accession: [id number] biopsy showed plasma cells involvement.   09/24/2014 Procedure she has kyphoplasty performed.   09/30/2014 Bone Marrow Biopsy Accession: [id number] bone marrow biopsy showed residual plasma cell, approximately 11%   11/06/2014 - 05/07/2015 Chemotherapy She was started on Velcade maintenance every other week, treatment is discontinued due to neuropathic pain.   06/01/2015 -  Chemotherapy The patient received Revlimid and start taking it as maintenance therapy.    INTERVAL HISTORY: Please see below for  problem oriented charting. She is seen today for further follow-up Her chest wall pain remained the same but maybe a little better She comes pain of bilateral blurry vision and had seen an ophthalmologist recently. Conservative management was recommended and it is not related to her treatment for myeloma She is able to get her bowel habits regulated with laxatives She denies nausea or vomiting She complained of severe left ankle pain over the past few weeks, relieved when she elevate her left ankle She denies recent trauma No associated leg swelling No new neurological deficit  REVIEW OF SYSTEMS:   Constitutional: Denies fevers, chills or abnormal weight loss Ears, nose, mouth, throat, and face: Denies mucositis or sore throat Respiratory: Denies cough, dyspnea or wheezes Cardiovascular: Denies palpitation, chest discomfort or lower extremity swelling Skin: Denies abnormal skin rashes Lymphatics: Denies new lymphadenopathy or easy bruising Neurological:Denies numbness, tingling or new weaknesses Behavioral/Psych: Mood is stable, no new changes  All other systems were reviewed with the patient and are negative.  I have reviewed the past medical history, past surgical history, social history and family history with the patient and they are unchanged from previous note.  ALLERGIES:  is allergic to hydrocodone; ace inhibitors; and cephalexin.  MEDICATIONS:  Current Outpatient Prescriptions  Medication Sig Dispense Refill  . acyclovir (ZOVIRAX) 400 MG tablet TAKE 1 TABLET BY MOUTH EVERY DAY 30 tablet 5  . amLODipine (NORVASC) 10 MG tablet TAKE 1 TABLET BY MOUTH EVERY DAY 90 tablet 3  . butalbital-acetaminophen-caffeine (FIORICET, ESGIC) 50-325-40 MG tablet TAKE 1 TABLET BY MOUTH EVERY 6 HOURS AS NEEDED FOR HEADACHE 30 tablet 0  . cholecalciferol (VITAMIN D) 1000 UNITS tablet Take 2,000 Units by mouth every morning.     Marland Kitchen  [facility] SENNA PLUS 8.6-50 MG per tablet TAKE 2 TABLETS BY MOUTH TWICE A  DAY 60 tablet 1  . HYDROmorphone (DILAUDID) 2 MG tablet Take 1 tablet (2 mg total) by mouth every 6 (six) hours as needed for severe pain. 90 tablet 0  . lenalidomide (REVLIMID) 5 MG capsule Take 1 daily for 21 days then off 7 days every 28 days 21 capsule 9  . lidocaine-prilocaine (EMLA) cream Apply 1 application topically as needed. 30 g 6  . morphine (MS CONTIN) 15 MG 12 hr tablet Take 1 tablet (15 mg total) by mouth every 12 (twelve) hours. 60 tablet 0  . polyvinyl alcohol (LIQUIFILM TEARS) 1.4 % ophthalmic solution Place 1 drop into both eyes as needed for dry eyes.     . potassium chloride SA (K-DUR,KLOR-CON) 20 MEQ tablet Take 1 tablet (20 mEq total) by mouth daily. 7 tablet 0  . ixazomib citrate (NINLARO) 4 MG capsule Take on an empty stomach 1hr before or 2hrs after food. Do not crush, chew or open. Take it once a week for 3 weeks, then rest 1 week (Patient not taking: Reported on 01/21/2016) 3 capsule 0   No current facility-administered medications for this visit.    PHYSICAL EXAMINATION: ECOG PERFORMANCE STATUS: 1 - Symptomatic but completely ambulatory  Filed Vitals:   01/21/16 1438  BP: 131/77  Pulse: 95  Temp: 98.1 F (36.7 C)  Resp: 18   Filed Weights   01/21/16 1438  Weight: 102 lb 12.8 oz (46.63 kg)    GENERAL:alert, no distress and comfortable. She looks thin and cachectic SKIN: skin color, texture, turgor are normal, no rashes or significant lesions EYES: normal, Conjunctiva are pink and non-injected, sclera clear OROPHARYNX:no exudate, no erythema and lips, buccal mucosa, and tongue normal  NECK: supple, thyroid normal size, non-tender, without nodularity LYMPH:  no palpable lymphadenopathy in the cervical, axillary or inguinal LUNGS: clear to auscultation and percussion with normal breathing effort HEART: regular rate & rhythm and no murmurs and no lower extremity edema ABDOMEN:abdomen soft, non-tender and normal bowel sounds Musculoskeletal:no cyanosis of  digits and no clubbing . Chest wall tenderness is elicited at the retrosternal area on gentle palpation. She also has tenderness on palpation over the left ankle region NEURO: alert & oriented x 3 with fluent speech, no focal motor/sensory deficits  LABORATORY DATA:  I have reviewed the data as listed    Component Value Date/Time   NA 140 01/21/2016 1424   NA 143 09/24/2014 0722   K 3.7 01/21/2016 1424   K 3.8 09/24/2014 0722   CL 105 09/24/2014 0722   CO2 23 01/21/2016 1424   CO2 27 09/24/2014 0722   GLUCOSE 146* 01/21/2016 1424   GLUCOSE 80 09/24/2014 0722   BUN 19.7 01/21/2016 1424   BUN 17 09/24/2014 0722   CREATININE 1.4* 01/21/2016 1424   CREATININE 1.22* 09/24/2014 0722   CALCIUM 9.7 01/21/2016 1424   CALCIUM 8.6 09/24/2014 0722   PROT 6.8 01/21/2016 1424   PROT 7.2 07/13/2014 1222   ALBUMIN 3.9 01/21/2016 1424   ALBUMIN 3.9 07/13/2014 1222   AST 22 01/21/2016 1424   AST 22 07/13/2014 1222   ALT 25 01/21/2016 1424   ALT 75* 07/13/2014 1222   ALKPHOS 61 01/21/2016 1424   ALKPHOS 90 07/13/2014 1222   BILITOT <0.30 01/21/2016 1424   BILITOT 0.4 07/13/2014 1222   GFRNONAA 47* 09/24/2014 0722   GFRAA 54* 09/24/2014 0722    No results found for: SPEP, UPEP  Lab Results  Component Value Date   WBC 7.7 01/21/2016   NEUTROABS 7.4* 01/21/2016   HGB 11.9 01/21/2016   HCT 35.8 01/21/2016   MCV 98.9 01/21/2016   PLT 198 01/21/2016      Chemistry      Component Value Date/Time   NA 140 01/21/2016 1424   NA 143 09/24/2014 0722   K 3.7 01/21/2016 1424   K 3.8 09/24/2014 0722   CL 105 09/24/2014 0722   CO2 23 01/21/2016 1424   CO2 27 09/24/2014 0722   BUN 19.7 01/21/2016 1424   BUN 17 09/24/2014 0722   CREATININE 1.4* 01/21/2016 1424   CREATININE 1.22* 09/24/2014 0722      Component Value Date/Time   CALCIUM 9.7 01/21/2016 1424   CALCIUM 8.6 09/24/2014 0722   ALKPHOS 61 01/21/2016 1424   ALKPHOS 90 07/13/2014 1222   AST 22 01/21/2016 1424   AST 22  07/13/2014 1222   ALT 25 01/21/2016 1424   ALT 75* 07/13/2014 1222   BILITOT <0.30 01/21/2016 1424   BILITOT 0.4 07/13/2014 1222       RADIOGRAPHIC STUDIES:I review her prior skeletal survey from 2015 which show no evidence of lytic lesion in her left ankle I have personally reviewed the radiological images as listed and agreed with the findings in the report.    ASSESSMENT & PLAN:  Multiple myeloma without remission She felt better since discontinuation of Ninlaro. She continues pulsed dexamethasone once a week on Fridays along with Revlimid. I recommend she resume this at half the dose and we will reassess next month. With her severe left ankle pain, I recommend x-rays of her left leg for further evaluation and she agreed to proceed Her next Zometa will be due in March She will continue prophylaxis treatment with calcium and vitamin D, acyclovir and aspirin  Bone pain She have chest wall pain and ankle pain related to myeloma ECG showed no evidence of cardiac related ischemia. I reassured the patient and recommended her to continue to take her pain medicine as needed    Left lateral ankle pain She has severe left ankle pain. I am concerned about possible fracture from myeloma. I will order additional x-rays for further evaluation  Blurry vision, bilateral She has seen an ophthalmologist and was told that she may have age associated floaters There were no evidence of retinal detachment Continue conservative management   Orders Placed This Encounter  Procedures  . DG Foot Complete Left    Standing Status: Future     Number of Occurrences:      Standing Expiration Date: 03/20/2017    Order Specific Question:  Reason for Exam (SYMPTOM  OR DIAGNOSIS REQUIRED)    Answer:  severe left ankle/foot pain, exclude fracture. Has myeloma    Order Specific Question:  Preferred imaging location?    Answer:  [facility]  . DG Knee 1-2 Views Left    Standing Status: Future      Number of Occurrences:      Standing Expiration Date: 03/20/2017    Order Specific Question:  Reason for Exam (SYMPTOM  OR DIAGNOSIS REQUIRED)    Answer:  severe left ankle/foot pain, exclude fracture. Has myeloma    Order Specific Question:  Preferred imaging location?    Answer:  [facility]    Standing Status: Future     Number of Occurrences:      Standing Expiration Date: 03/20/2017    Order  Specific Question:  Reason for Exam (SYMPTOM  OR DIAGNOSIS REQUIRED)    Answer:  severe left ankle/foot pain, exclude fracture. Has myeloma    Order Specific Question:  Preferred imaging location?    Answer:  [facility]   All questions were answered. The patient knows to call the clinic with any problems, questions or concerns. No barriers to learning was detected. I spent 25 minutes counseling the patient face to face. The total time spent in the appointment was 30 minutes and more than 50% was on counseling and review of test results     [facility], [place], MD 01/21/2016 3:43 PM

## 2016-01-21 NOTE — Telephone Encounter (Signed)
appt made and avs printed °

## 2016-01-21 NOTE — Assessment & Plan Note (Signed)
She felt better since discontinuation of Ninlaro. She continues pulsed dexamethasone once a week on Fridays along with Revlimid. I recommend she resume this at half the dose and we will reassess next month. With her severe left ankle pain, I recommend x-rays of her left leg for further evaluation and she agreed to proceed Her next Zometa will be due in March She will continue prophylaxis treatment with calcium and vitamin D, acyclovir and aspirin

## 2016-01-24 ENCOUNTER — Other Ambulatory Visit: Payer: Self-pay | Admitting: Hematology and Oncology

## 2016-01-24 ENCOUNTER — Telehealth: Payer: Self-pay | Admitting: *Deleted

## 2016-01-24 ENCOUNTER — Encounter: Payer: Self-pay | Admitting: *Deleted

## 2016-01-24 DIAGNOSIS — C9000 Multiple myeloma not having achieved remission: Secondary | ICD-10-CM

## 2016-01-24 LAB — KAPPA/LAMBDA LIGHT CHAINS
IG LAMBDA FREE LIGHT CHAIN: 9.69 mg/L (ref 5.71–26.30)
Ig Kappa Free Light Chain: 384.06 mg/L — ABNORMAL HIGH (ref 3.30–19.40)
KAPPA/LAMBDA FLC RATIO: 39.63 — AB (ref 0.26–1.65)

## 2016-01-24 MED ORDER — IXAZOMIB CITRATE 3 MG PO CAPS: ORAL_CAPSULE | ORAL | Status: DC

## 2016-01-24 MED ORDER — LENALIDOMIDE 5 MG PO CAPS: ORAL_CAPSULE | ORAL | Status: DC

## 2016-01-24 NOTE — Telephone Encounter (Signed)
-----   Message from Heath Lark, MD sent at 01/24/2016  8:22 AM EST ----- Regarding: XRAY Pls let her know XRAY showed no fractures but confirmed myeloma lesion No surgery/anything extra needed to happen Continue conservative management with pain medications  ----- Message -----    From: Rad Results In Interface    Sent: 01/21/2016   4:45 PM      To: Heath Lark, MD

## 2016-01-24 NOTE — Telephone Encounter (Signed)
Pt notified of results below.  Pt states she has one week left of Revlimid. No capsules of Ninlaro remaining. Instructed her that Dr Alvy Bimler would be prescribing lower dose of Ninlaro.   Does not have any Friday lab appts.

## 2016-01-25 ENCOUNTER — Other Ambulatory Visit: Payer: Self-pay | Admitting: *Deleted

## 2016-01-25 MED ORDER — IXAZOMIB CITRATE 3 MG PO CAPS: ORAL_CAPSULE | ORAL | Status: DC

## 2016-01-26 ENCOUNTER — Other Ambulatory Visit: Payer: Self-pay | Admitting: *Deleted

## 2016-01-26 LAB — MULTIPLE MYELOMA PANEL, SERUM
ALBUMIN/GLOB SERPL: 1.8 — AB (ref 0.7–1.7)
ALPHA2 GLOB SERPL ELPH-MCNC: 0.8 g/dL (ref 0.4–1.0)
Albumin SerPl Elph-Mcnc: 4.1 g/dL (ref 2.9–4.4)
Alpha 1: 0.2 g/dL (ref 0.0–0.4)
B-GLOBULIN SERPL ELPH-MCNC: 1.1 g/dL (ref 0.7–1.3)
GAMMA GLOB SERPL ELPH-MCNC: 0.3 g/dL — AB (ref 0.4–1.8)
GLOBULIN, TOTAL: 2.3 g/dL (ref 2.2–3.9)
IGG (IMMUNOGLOBIN G), SERUM: 367 mg/dL — AB (ref 700–1600)
IGM (IMMUNOGLOBIN M), SRM: 57 mg/dL (ref 26–217)
IgA, Qn, Serum: 147 mg/dL (ref 87–352)
M Protein SerPl Elph-Mcnc: 0.2 g/dL — ABNORMAL HIGH
Total Protein: 6.4 g/dL (ref 6.0–8.5)

## 2016-01-27 ENCOUNTER — Telehealth: Payer: Self-pay | Admitting: *Deleted

## 2016-01-27 ENCOUNTER — Telehealth: Payer: Self-pay | Admitting: Hematology and Oncology

## 2016-01-27 NOTE — Telephone Encounter (Signed)
per pof to sch pt trmt -injection was sch-sent MW email to sch trmt-pt aware

## 2016-01-27 NOTE — Telephone Encounter (Signed)
Per staff message and POF I have scheduled appts. Advised scheduler of appts. JMW  

## 2016-02-01 ENCOUNTER — Other Ambulatory Visit: Payer: Self-pay | Admitting: *Deleted

## 2016-02-01 MED ORDER — LENALIDOMIDE 5 MG PO CAPS: ORAL_CAPSULE | ORAL | Status: DC

## 2016-02-02 ENCOUNTER — Telehealth: Payer: Self-pay | Admitting: *Deleted

## 2016-02-02 NOTE — Telephone Encounter (Signed)
Pt left VM states she does not come back to see Dr. Alvy Bimler until 3/31.  She says her pain is managed well on pain medication.  She asks if she is supposed to "take another cycle of chemo pills" before her next appt?  I called pt back and LVM informing that Dr. Calton Dach note says for pt to hold Ninlaro but continue Revlimid.  Revlimid Rx was faxed to [facility] yesterday for refill.   Asked pt to call back if any questions about taking Revlimid.

## 2016-02-02 NOTE — Telephone Encounter (Signed)
Ok.  LVM for pt Clarifying she is to take Reduced dose of Ninlaro, 3mg , once weekly for 3 weeks on and one week off.  She also takes Revlimid 5 mg daily for 3 weeks on and one week off.  Ninlaro Rx was faxed to pharmacy on 2/21 and Revlimid was faxed yesterday on 2/28.   Instructed pt to please call back to confirm instructions.

## 2016-02-02 NOTE — Telephone Encounter (Signed)
I think my last note did not reflect some of our previous discussion I think she wanted to try Ninlaro again but at reduced dose I told her to finish her old prescription and I will renew Ninlaro at reduced dose So she should have a reduced dose Ninlaro prescription

## 2016-02-08 ENCOUNTER — Other Ambulatory Visit: Payer: Self-pay | Admitting: *Deleted

## 2016-02-11 ENCOUNTER — Other Ambulatory Visit: Payer: Self-pay | Admitting: Hematology and Oncology

## 2016-02-11 DIAGNOSIS — C9000 Multiple myeloma not having achieved remission: Secondary | ICD-10-CM

## 2016-02-11 MED ORDER — HYDROMORPHONE HCL 2 MG PO TABS: 2.0000 mg | ORAL_TABLET | Freq: Four times a day (QID) | ORAL | Status: DC | PRN

## 2016-03-01 ENCOUNTER — Other Ambulatory Visit: Payer: Self-pay | Admitting: *Deleted

## 2016-03-01 ENCOUNTER — Other Ambulatory Visit: Payer: Self-pay | Admitting: Hematology and Oncology

## 2016-03-01 DIAGNOSIS — C9000 Multiple myeloma not having achieved remission: Secondary | ICD-10-CM

## 2016-03-01 MED ORDER — IXAZOMIB CITRATE 3 MG PO CAPS: ORAL_CAPSULE | ORAL | Status: DC

## 2016-03-01 MED ORDER — LENALIDOMIDE 5 MG PO CAPS: ORAL_CAPSULE | ORAL | Status: DC

## 2016-03-03 ENCOUNTER — Telehealth: Payer: Self-pay | Admitting: Hematology and Oncology

## 2016-03-03 ENCOUNTER — Ambulatory Visit (HOSPITAL_BASED_OUTPATIENT_CLINIC_OR_DEPARTMENT_OTHER): Payer: 59 | Admitting: Hematology and Oncology

## 2016-03-03 ENCOUNTER — Ambulatory Visit (HOSPITAL_BASED_OUTPATIENT_CLINIC_OR_DEPARTMENT_OTHER): Payer: 59

## 2016-03-03 ENCOUNTER — Other Ambulatory Visit (HOSPITAL_BASED_OUTPATIENT_CLINIC_OR_DEPARTMENT_OTHER): Payer: 59

## 2016-03-03 ENCOUNTER — Ambulatory Visit: Payer: 59

## 2016-03-03 ENCOUNTER — Encounter: Payer: Self-pay | Admitting: Hematology and Oncology

## 2016-03-03 VITALS — BP 154/78 | HR 92 | Temp 98.8°F | Resp 18 | Ht 60.0 in | Wt 103.2 lb

## 2016-03-03 DIAGNOSIS — M25572 Pain in left ankle and joints of left foot: Secondary | ICD-10-CM

## 2016-03-03 DIAGNOSIS — C9000 Multiple myeloma not having achieved remission: Secondary | ICD-10-CM

## 2016-03-03 DIAGNOSIS — F4541 Pain disorder exclusively related to psychological factors: Secondary | ICD-10-CM | POA: Diagnosis not present

## 2016-03-03 DIAGNOSIS — R748 Abnormal levels of other serum enzymes: Secondary | ICD-10-CM

## 2016-03-03 LAB — CBC WITH DIFFERENTIAL/PLATELET
BASO%: 0.2 % (ref 0.0–2.0)
Basophils Absolute: 0 10*3/uL (ref 0.0–0.1)
EOS ABS: 0 10*3/uL (ref 0.0–0.5)
EOS%: 0 % (ref 0.0–7.0)
HEMATOCRIT: 38.2 % (ref 34.8–46.6)
HEMOGLOBIN: 12.6 g/dL (ref 11.6–15.9)
LYMPH#: 0.4 10*3/uL — AB (ref 0.9–3.3)
LYMPH%: 6.7 % — AB (ref 14.0–49.7)
MCH: 32.1 pg (ref 25.1–34.0)
MCHC: 33 g/dL (ref 31.5–36.0)
MCV: 97.2 fL (ref 79.5–101.0)
MONO#: 0.3 10*3/uL (ref 0.1–0.9)
MONO%: 5.9 % (ref 0.0–14.0)
NEUT%: 87.2 % — ABNORMAL HIGH (ref 38.4–76.8)
NEUTROS ABS: 4.7 10*3/uL (ref 1.5–6.5)
PLATELETS: 203 10*3/uL (ref 145–400)
RBC: 3.93 10*6/uL (ref 3.70–5.45)
RDW: 13.1 % (ref 11.2–14.5)
WBC: 5.4 10*3/uL (ref 3.9–10.3)

## 2016-03-03 LAB — COMPREHENSIVE METABOLIC PANEL
ALT: 100 U/L — ABNORMAL HIGH (ref 0–55)
AST: 46 U/L — AB (ref 5–34)
Albumin: 3.9 g/dL (ref 3.5–5.0)
Alkaline Phosphatase: 124 U/L (ref 40–150)
Anion Gap: 9 mEq/L (ref 3–11)
BUN: 20.4 mg/dL (ref 7.0–26.0)
CHLORIDE: 107 meq/L (ref 98–109)
CO2: 26 mEq/L (ref 22–29)
Calcium: 9.8 mg/dL (ref 8.4–10.4)
Creatinine: 1.1 mg/dL (ref 0.6–1.1)
EGFR: 55 mL/min/{1.73_m2} — ABNORMAL LOW (ref >90)
GLUCOSE: 116 mg/dL (ref 70–140)
POTASSIUM: 3.8 meq/L (ref 3.5–5.1)
SODIUM: 141 meq/L (ref 136–145)
Total Bilirubin: <0.3 mg/dL (ref 0.20–1.20)
Total Protein: 6.9 g/dL (ref 6.4–8.3)

## 2016-03-03 MED ORDER — ZOLEDRONIC ACID 4 MG/100ML IV SOLN
4.0000 mg | Freq: Once | INTRAVENOUS | Status: AC
  Administered 2016-03-03: 4 mg via INTRAVENOUS
  Filled 2016-03-03: qty 100

## 2016-03-03 MED ORDER — BUTALBITAL-APAP-CAFFEINE 50-325-40 MG PO TABS: ORAL_TABLET | ORAL | Status: DC

## 2016-03-03 MED ORDER — SODIUM CHLORIDE 0.9 % IV SOLN
Freq: Once | INTRAVENOUS | Status: AC
  Administered 2016-03-03: 16:00:00 via INTRAVENOUS

## 2016-03-03 MED ORDER — SODIUM CHLORIDE 0.9 % IJ SOLN
3.0000 mL | Freq: Once | INTRAMUSCULAR | Status: DC | PRN
  Filled 2016-03-03: qty 10

## 2016-03-03 MED ORDER — SODIUM CHLORIDE 0.9 % IJ SOLN
10.0000 mL | INTRAMUSCULAR | Status: DC | PRN
  Filled 2016-03-03: qty 10

## 2016-03-03 MED ORDER — ALTEPLASE 2 MG IJ SOLR
2.0000 mg | Freq: Once | INTRAMUSCULAR | Status: DC | PRN
  Filled 2016-03-03: qty 2

## 2016-03-03 MED ORDER — HEPARIN SOD (PORK) LOCK FLUSH 100 UNIT/ML IV SOLN
250.0000 [IU] | Freq: Once | INTRAVENOUS | Status: DC | PRN
  Filled 2016-03-03: qty 5

## 2016-03-03 MED ORDER — HEPARIN SOD (PORK) LOCK FLUSH 100 UNIT/ML IV SOLN
500.0000 [IU] | Freq: Once | INTRAVENOUS | Status: DC | PRN
  Filled 2016-03-03: qty 5

## 2016-03-03 NOTE — Telephone Encounter (Signed)
Gave and printed appt shced and avs for pt for JUNE °

## 2016-03-03 NOTE — Progress Notes (Signed)
[facility] OFFICE PROGRESS NOTE  Patient Care Team: Hulan Fess, MD as PCP - General (Family Medicine) Luanne Bras, MD as Consulting Physician (Interventional Radiology) Webb Laws, OD as Consulting Physician (Optometry)  SUMMARY OF ONCOLOGIC HISTORY: Oncology History   Multiple myeloma without remission; Calcium 13.3, Creatinine 2.83, Hg 8.3, Bone lesions present, Beta 2 microglobulin 12.8, Albumin 3.5, IgA 1760 and kappa light chains 1670. Bone marrow biopsy showed 82% involvement.   Primary site: Multiple Myeloma   Staging method: AJCC 6th Edition   Clinical: Stage IIIB signed by Heath Lark, MD on 07/13/2014 12:38 PM   Summary: Stage IIIB       Multiple myeloma without remission ([place])   07/07/2014 - 07/10/2014 Hospital Admission She was admitted to the hospital for management of malignant hypercalcemia and had bone marrow biopsy which confirmed diagnosis of multiple myeloma. The patient received one unit of blood transfusion due to severe anemia.   07/08/2014 Bone Marrow Biopsy The patient had bone marrow biopsy that confirmed diagnosis.   07/08/2014 Pathology Results Accession: [id number] showed 82% involvement of bone marrow.   07/08/2014 - 10/19/2014 Chemotherapy She was started on Velcade along with dexamethasone.   08/21/2014 - 10/19/2014 Chemotherapy I added Revlimid and Zometa to her chemotherapy regimen.   09/24/2014 Pathology Results Accession: [id number] T12 biopsy showed plasma cells involvement.   09/24/2014 Procedure she has kyphoplasty performed.   09/30/2014 Bone Marrow Biopsy Accession: [id number] bone marrow biopsy showed residual plasma cell, approximately 11%   11/06/2014 - 05/07/2015 Chemotherapy She was started on Velcade maintenance every other week, treatment is discontinued due to neuropathic pain.   05/14/2015 - 07/02/2015 Chemotherapy The patient received Revlimid and start taking it as maintenance therapy. Treatment was discontinued due to  side-effects   12/10/2015 -  Chemotherapy She resumed treatment due to disease relapse with Revlimid, dexamethasone and Ninlaro   01/07/2016 Adverse Reaction Treatment was placed on hold due to fatigue and diarrhea. She subsequently resumed treatment at reduced dose    INTERVAL HISTORY: Please see below for problem oriented charting. She feels well apart from occasional headache and persistent chronic lower back pain and left ankle pain. She denies nausea, vomiting or diarrhea. No recent infection. Overall, she tolerated reduced dose treatment well.  REVIEW OF SYSTEMS:   Constitutional: Denies fevers, chills or abnormal weight loss Eyes: Denies blurriness of vision Ears, nose, mouth, throat, and face: Denies mucositis or sore throat Respiratory: Denies cough, dyspnea or wheezes Cardiovascular: Denies palpitation, chest discomfort or lower extremity swelling Gastrointestinal:  Denies nausea, heartburn or change in bowel habits Skin: Denies abnormal skin rashes Lymphatics: Denies new lymphadenopathy or easy bruising Neurological:Denies numbness, tingling or new weaknesses Behavioral/Psych: Mood is stable, no new changes  All other systems were reviewed with the patient and are negative.  I have reviewed the past medical history, past surgical history, social history and family history with the patient and they are unchanged from previous note.  ALLERGIES:  is allergic to hydrocodone; ace inhibitors; and cephalexin.  MEDICATIONS:  Current Outpatient Prescriptions  Medication Sig Dispense Refill  . acyclovir (ZOVIRAX) 400 MG tablet TAKE 1 TABLET BY MOUTH EVERY DAY 30 tablet 5  . amLODipine (NORVASC) 10 MG tablet TAKE 1 TABLET BY MOUTH EVERY DAY 90 tablet 3  . butalbital-acetaminophen-caffeine (FIORICET, ESGIC) 50-325-40 MG tablet TAKE 1 TABLET BY MOUTH EVERY 6 HOURS AS NEEDED FOR HEADACHE 90 tablet 0  . cholecalciferol (VITAMIN D) 1000 UNITS tablet Take 2,000 Units by mouth every morning.      Marland Kitchen  [facility] SENNA PLUS 8.6-50 MG per tablet TAKE 2 TABLETS BY MOUTH TWICE A DAY 60 tablet 1  . HYDROmorphone (DILAUDID) 2 MG tablet Take 1 tablet (2 mg total) by mouth every 6 (six) hours as needed for severe pain. 90 tablet 0  . ixazomib citrate (NINLARO) 3 MG capsule Take on an empty stomach 1hr before or 2hrs after food. Do not crush, chew or open. Take it once a week for 3 weeks, then rest 1 week 3 capsule 0  . lenalidomide (REVLIMID) 5 MG capsule Take 1 daily for 21 days then off 7 days every 28 days 21 capsule 9  . lidocaine-prilocaine (EMLA) cream Apply 1 application topically as needed. 30 g 6  . morphine (MS CONTIN) 15 MG 12 hr tablet Take 1 tablet (15 mg total) by mouth every 12 (twelve) hours. 60 tablet 0  . ondansetron (ZOFRAN) 4 MG tablet Take 4 mg by mouth every 8 (eight) hours as needed for nausea or vomiting.    . polyvinyl alcohol (LIQUIFILM TEARS) 1.4 % ophthalmic solution Place 1 drop into both eyes as needed for dry eyes.     . potassium chloride SA (K-DUR,KLOR-CON) 20 MEQ tablet Take 1 tablet (20 mEq total) by mouth daily. 7 tablet 0   No current facility-administered medications for this visit.   Facility-Administered Medications Ordered in Other Visits  Medication Dose Route Frequency Provider Last Rate Last Dose  . alteplase (CATHFLO ACTIVASE) injection 2 mg  2 mg Intracatheter Once PRN Heath Lark, MD      . heparin lock flush 100 unit/mL  500 Units Intracatheter Once PRN Heath Lark, MD      . heparin lock flush 100 unit/mL  250 Units Intracatheter Once PRN Heath Lark, MD      . sodium chloride 0.9 % injection 10 mL  10 mL Intracatheter PRN Heath Lark, MD      . sodium chloride 0.9 % injection 3 mL  3 mL Intravenous Once PRN Heath Lark, MD      . zolendronic acid (ZOMETA) 4 mg in sodium chloride 0.9 % 100 mL IVPB  4 mg Intravenous Once Heath Lark, MD        PHYSICAL EXAMINATION: ECOG PERFORMANCE STATUS: 1 - Symptomatic but completely ambulatory  Filed Vitals:    03/03/16 1502  BP: 154/78  Pulse: 92  Temp: 98.8 F (37.1 C)  Resp: 18   Filed Weights   03/03/16 1502  Weight: 103 lb 3.2 oz (46.811 kg)    GENERAL:alert, no distress and comfortable SKIN: skin color, texture, turgor are normal, no rashes or significant lesions EYES: normal, Conjunctiva are pink and non-injected, sclera clear OROPHARYNX:no exudate, no erythema and lips, buccal mucosa, and tongue normal  NECK: supple, thyroid normal size, non-tender, without nodularity LYMPH:  no palpable lymphadenopathy in the cervical, axillary or inguinal LUNGS: clear to auscultation and percussion with normal breathing effort HEART: regular rate & rhythm and no murmurs and no lower extremity edema ABDOMEN:abdomen soft, non-tender and normal bowel sounds Musculoskeletal:no cyanosis of digits and no clubbing  NEURO: alert & oriented x 3 with fluent speech, no focal motor/sensory deficits  LABORATORY DATA:  I have reviewed the data as listed    Component Value Date/Time   NA 141 03/03/2016 1451   NA 143 09/24/2014 0722   K 3.8 03/03/2016 1451   K 3.8 09/24/2014 0722   CL 105 09/24/2014 0722   CO2 26 03/03/2016 1451   CO2 27 09/24/2014 0722   GLUCOSE  116 03/03/2016 1451   GLUCOSE 80 09/24/2014 0722   BUN 20.4 03/03/2016 1451   BUN 17 09/24/2014 0722   CREATININE 1.1 03/03/2016 1451   CREATININE 1.22* 09/24/2014 0722   CALCIUM 9.8 03/03/2016 1451   CALCIUM 8.6 09/24/2014 0722   PROT 6.9 03/03/2016 1451   PROT 6.4 01/21/2016 1423   PROT 7.2 07/13/2014 1222   ALBUMIN 3.9 03/03/2016 1451   ALBUMIN 3.9 07/13/2014 1222   AST 46* 03/03/2016 1451   AST 22 07/13/2014 1222   ALT 100* 03/03/2016 1451   ALT 75* 07/13/2014 1222   ALKPHOS 124 03/03/2016 1451   ALKPHOS 90 07/13/2014 1222   BILITOT <0.30 03/03/2016 1451   BILITOT 0.4 07/13/2014 1222   GFRNONAA 47* 09/24/2014 0722   GFRAA 54* 09/24/2014 0722    No results found for: SPEP, UPEP  Lab Results  Component Value Date   WBC  5.4 03/03/2016   NEUTROABS 4.7 03/03/2016   HGB 12.6 03/03/2016   HCT 38.2 03/03/2016   MCV 97.2 03/03/2016   PLT 203 03/03/2016      Chemistry      Component Value Date/Time   NA 141 03/03/2016 1451   NA 143 09/24/2014 0722   K 3.8 03/03/2016 1451   K 3.8 09/24/2014 0722   CL 105 09/24/2014 0722   CO2 26 03/03/2016 1451   CO2 27 09/24/2014 0722   BUN 20.4 03/03/2016 1451   BUN 17 09/24/2014 0722   CREATININE 1.1 03/03/2016 1451   CREATININE 1.22* 09/24/2014 0722      Component Value Date/Time   CALCIUM 9.8 03/03/2016 1451   CALCIUM 8.6 09/24/2014 0722   ALKPHOS 124 03/03/2016 1451   ALKPHOS 90 07/13/2014 1222   AST 46* 03/03/2016 1451   AST 22 07/13/2014 1222   ALT 100* 03/03/2016 1451   ALT 75* 07/13/2014 1222   BILITOT <0.30 03/03/2016 1451   BILITOT 0.4 07/13/2014 1222     ASSESSMENT & PLAN:  Multiple myeloma without remission She tolerated reduced dose Ninlaro along with Revlimid well. I am waiting for her myeloma panel results. If test results show that her disease is under control, I will start to initiate steroid taper next week. She will continue aspirin for DVT prophylaxis along with calcium and vitamin D while on Zometa. She is due for Zometa today and every 3 months. She has appointment to see her dentist pending. She denies recent dental issues.  Stress headaches She has occasional stress headaches. I refill her prescription Fioricet to take as needed.  Left lateral ankle pain She has chronic left ankle pain and back pain Recent imaging studies show no evidence of fractures. She will continue to take Dilaudid as needed.   Elevated liver enzymes She has recurrence elevated liver enzymes of unknown reason. Could be due to [facility] I recommend recheck her blood work next week   Orders Placed This Encounter  Procedures  . Multiple Myeloma Panel (SPEP&IFE w/QIG)    Standing Status: Future     Number of Occurrences:      Standing Expiration  Date: 04/07/2017  . Kappa/lambda light chains    Standing Status: Future     Number of Occurrences:      Standing Expiration Date: 04/07/2017   All questions were answered. The patient knows to call the clinic with any problems, questions or concerns. No barriers to learning was detected. I spent 25 minutes counseling the patient face to face. The total time spent in the appointment was 61  minutes and more than 50% was on counseling and review of test results     [facility], Tracey Stewart, MD 03/03/2016 4:07 PM

## 2016-03-03 NOTE — Assessment & Plan Note (Signed)
She tolerated reduced dose Ninlaro along with Revlimid well. I am waiting for her myeloma panel results. If test results show that her disease is under control, I will start to initiate steroid taper next week. She will continue aspirin for DVT prophylaxis along with calcium and vitamin D while on Zometa. She is due for Zometa today and every 3 months. She has appointment to see her dentist pending. She denies recent dental issues.

## 2016-03-03 NOTE — Assessment & Plan Note (Signed)
She has chronic left ankle pain and back pain Recent imaging studies show no evidence of fractures. She will continue to take Dilaudid as needed.

## 2016-03-03 NOTE — Patient Instructions (Signed)

## 2016-03-03 NOTE — Assessment & Plan Note (Signed)
She has occasional stress headaches. I refill her prescription Fioricet to take as needed.

## 2016-03-03 NOTE — Assessment & Plan Note (Signed)
She has recurrence elevated liver enzymes of unknown reason. Could be due to [facility] I recommend recheck her blood work next week

## 2016-03-06 LAB — KAPPA/LAMBDA LIGHT CHAINS
IG KAPPA FREE LIGHT CHAIN: 269.67 mg/L — AB (ref 3.30–19.40)
Ig Lambda Free Light Chain: 10.97 mg/L (ref 5.71–26.30)
KAPPA/LAMBDA FLC RATIO: 24.58 — AB (ref 0.26–1.65)

## 2016-03-06 IMAGING — XA IR RFA BONE TUMOR
1 series · 14 of 24 positions shown · non-contrast
Comparison: none

CLINICAL DATA: Severe low back pain at the thoracolumbar region
secondary to pathologic compression fracture at T12 .

[Series 300: ir bone tumor rf ablation · 14 of 24 slices shown]
[im 1/24]
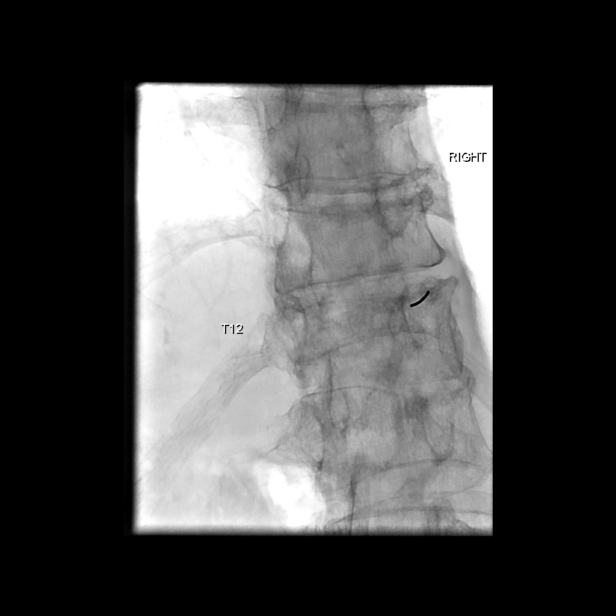
[im 3/24]
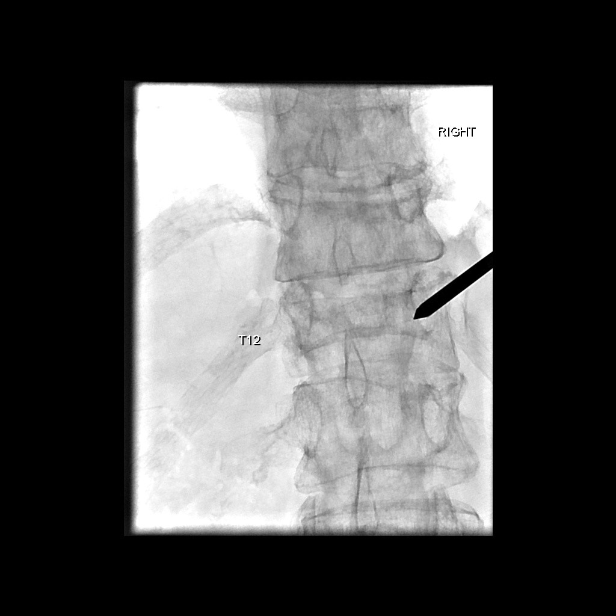
[im 5/24]
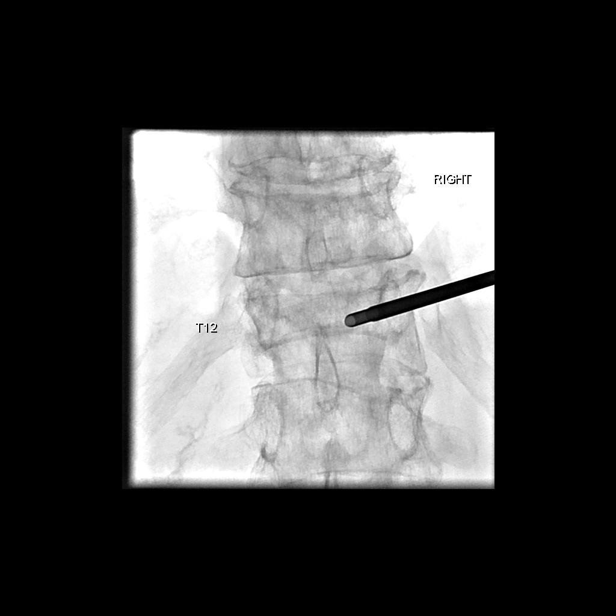
[im 7/24]
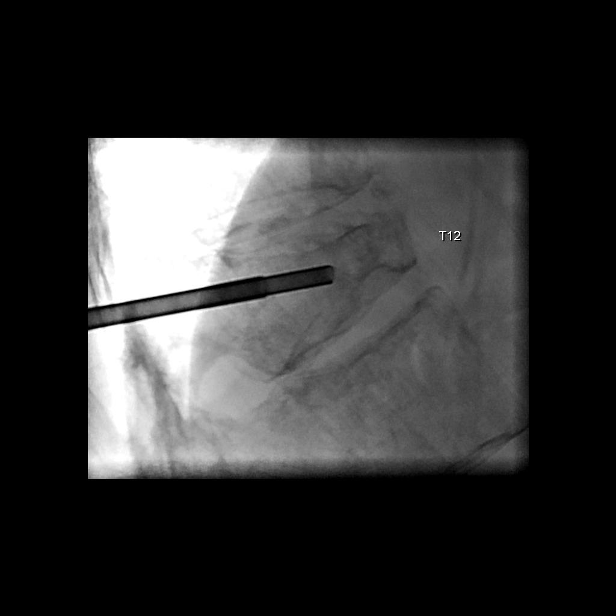
[im 8/24]
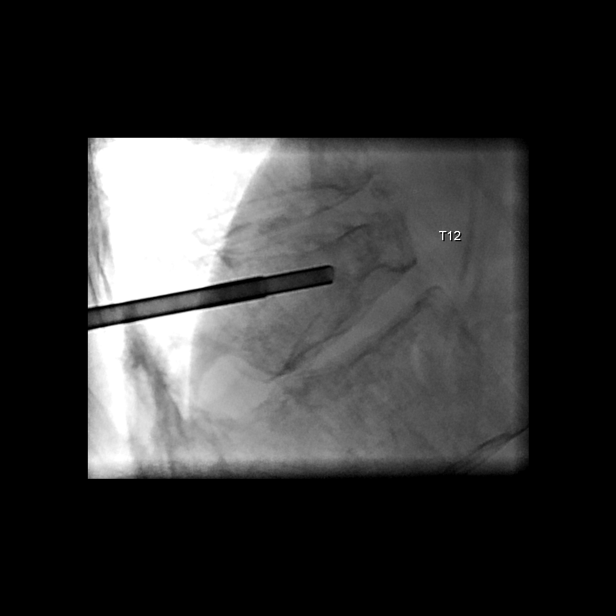
[im 10/24]
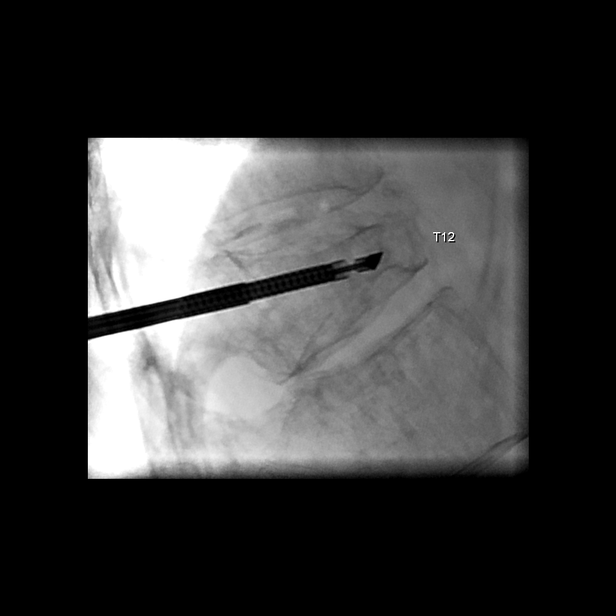
[im 12/24]
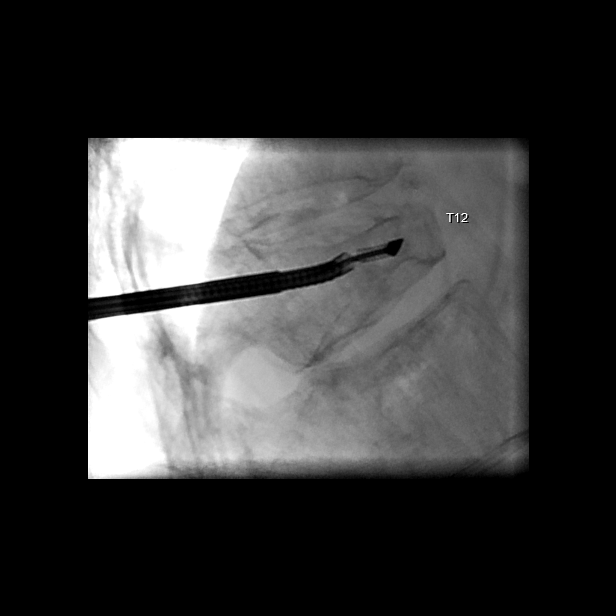
[im 13/24]
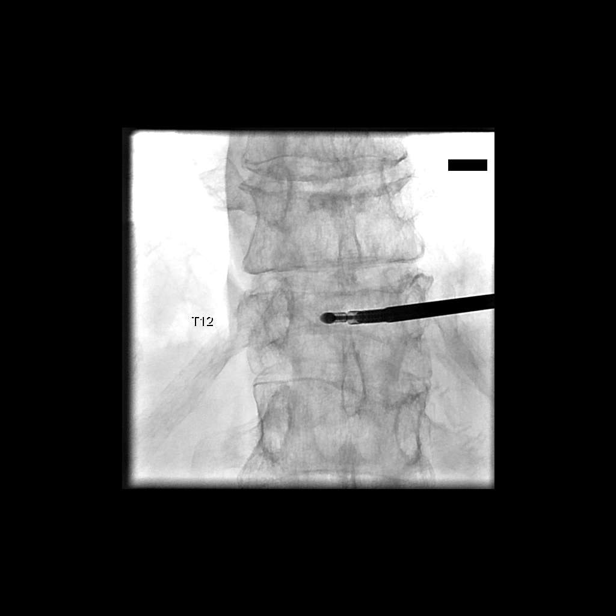
[im 15/24]
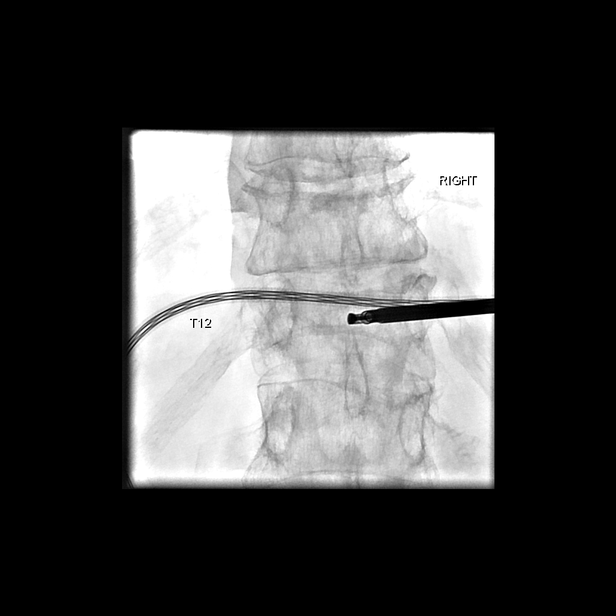
[im 17/24]
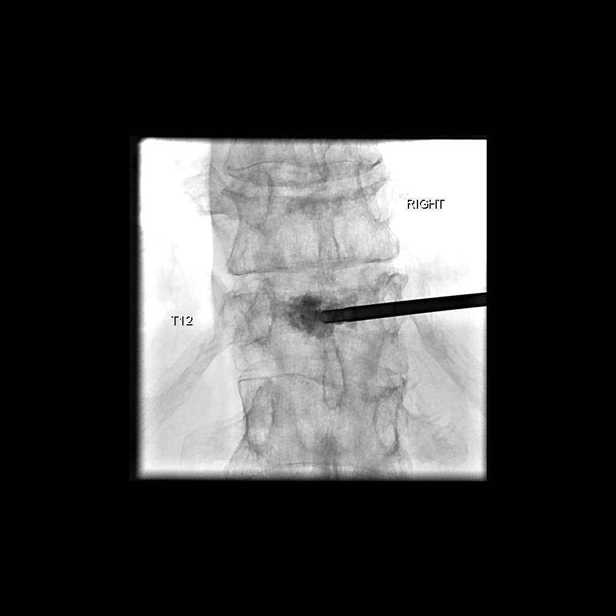
[im 19/24]
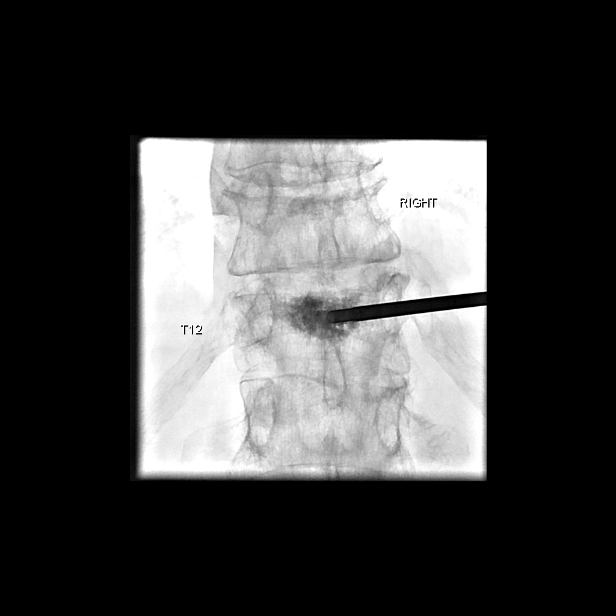
[im 20/24]
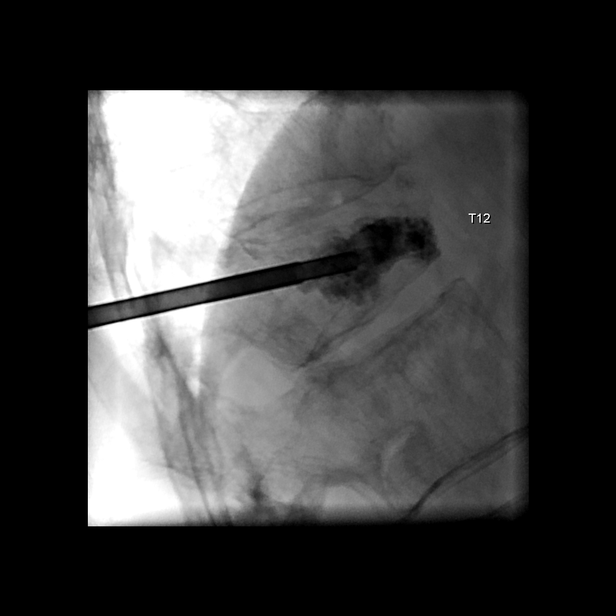
[im 22/24]
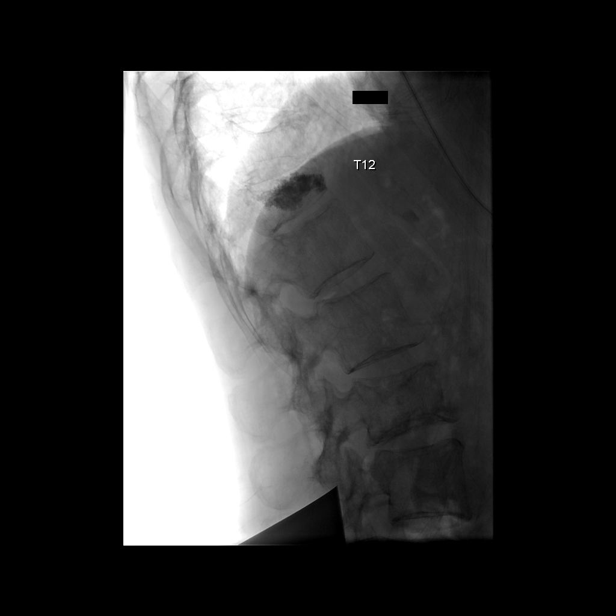
[im 24/24]
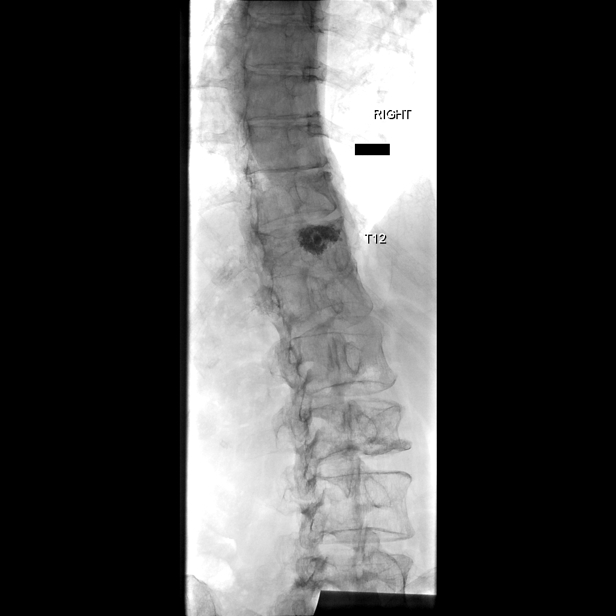

[14 of 24 positions shown; findings below may reference images not displayed]

EXAM:
RFA BONE TUMOR AT T12 FOLLOWED BY VERTEBRAL BODY AUGMENTATION:

ANESTHESIA/SEDATION:
Conscious sedation.

MEDICATIONS:
Versed 1 mg IV.  Fentanyl 25 mcg IV.

CONTRAST:  None.

PROCEDURE:
Following a full explanation of the procedure along with the
potential associated complications, an informed witnessed consent
was obtained.

The patient was laid prone on the fluoroscopic table. The skin
overlying the thoracolumbar region was then prepped and draped in
usual sterile fashion.

The skin overlying the right pedicle at T12 was then infiltrated
with 0.25% bupivacaine and carried to the right pedicle.

Using biplane intermittent fluoroscopy, a 10.5 gauge DFine spinal
needle was then advanced to the proximal one-third at T12. Through
this, a DFine core biopsy device was then advanced distal to the
needle tip. Using a 20 mL syringe, a core sample was obtained and
sent for pathologic analysis. Through the working needle, a DFine
radiofrequency device was then advanced and then manipulated in
different directions. The total ablation time was 3 minutes and 8
seconds. Maximum temperatures reached at the distal end was of 51
degrees Celsius and approximately 43 degrees Celsius.

This was then removed. The needle was then advanced to the junction
of the anterior and the middle one-third at T12.

At this time methylmethacrylate mixture was reconstituted with
tobramycin in the DFine mixing system. This was then loaded onto the
DFine injector device. The injector device was then locked into
position at the hub of the working needle.

Using biplane intermittent fluoroscopy, pulse delivery of
methylmethacrylate mixture was then injected at T12. Excellent
filling was obtained in the AP and lateral projections. There was no
extrusion into the adjacent disc spaces or posteriorly into the
spinal canal or into the paraspinous venous structures. The working
needle and the injector device were then removed. Hemostasis was
achieved at the skin entry site. The patient tolerated the procedure
well.

COMPLICATIONS:
None immediate.
FINDINGS: As above.
IMPRESSION: Status post fluoroscopic guided needle placement for deep core
biopsy at T12.

Status biopsy radiofrequency ablation at T12 followed by vertebral
body augmentation using the DFine vertebroplasty technique.

## 2016-03-07 LAB — MULTIPLE MYELOMA PANEL, SERUM
ALBUMIN SERPL ELPH-MCNC: 3.9 g/dL (ref 2.9–4.4)
ALPHA 1: 0.2 g/dL (ref 0.0–0.4)
Albumin/Glob SerPl: 1.6 (ref 0.7–1.7)
Alpha2 Glob SerPl Elph-Mcnc: 0.8 g/dL (ref 0.4–1.0)
B-Globulin SerPl Elph-Mcnc: 1.2 g/dL (ref 0.7–1.3)
GAMMA GLOB SERPL ELPH-MCNC: 0.4 g/dL (ref 0.4–1.8)
GLOBULIN, TOTAL: 2.6 g/dL (ref 2.2–3.9)
IGA/IMMUNOGLOBULIN A, SERUM: 137 mg/dL (ref 87–352)
IGM (IMMUNOGLOBIN M), SRM: 66 mg/dL (ref 26–217)
IgG, Qn, Serum: 403 mg/dL — ABNORMAL LOW (ref 700–1600)
Total Protein: 6.5 g/dL (ref 6.0–8.5)

## 2016-03-08 ENCOUNTER — Telehealth: Payer: Self-pay | Admitting: *Deleted

## 2016-03-08 ENCOUNTER — Telehealth: Payer: Self-pay | Admitting: Hematology and Oncology

## 2016-03-08 NOTE — Telephone Encounter (Signed)
I spoke with the patient over the telephone and review recent test result from myeloma panel. I recommend that dexamethasone taper.

## 2016-03-08 NOTE — Telephone Encounter (Signed)
This RN called [facility] to see if they received faxed prescription refills. [facility] stated,"we just need to call the patient and set up delivery". I left a message on patient's cell phone instructing her that [facility] would be calling to set up delivery.

## 2016-03-10 ENCOUNTER — Other Ambulatory Visit (HOSPITAL_BASED_OUTPATIENT_CLINIC_OR_DEPARTMENT_OTHER): Payer: 59

## 2016-03-10 DIAGNOSIS — C9000 Multiple myeloma not having achieved remission: Secondary | ICD-10-CM | POA: Diagnosis not present

## 2016-03-10 LAB — CBC WITH DIFFERENTIAL/PLATELET
BASO%: 0.6 % (ref 0.0–2.0)
Basophils Absolute: 0 10*3/uL (ref 0.0–0.1)
EOS ABS: 0 10*3/uL (ref 0.0–0.5)
EOS%: 0 % (ref 0.0–7.0)
HEMATOCRIT: 37.7 % (ref 34.8–46.6)
HGB: 12.3 g/dL (ref 11.6–15.9)
LYMPH#: 0.2 10*3/uL — AB (ref 0.9–3.3)
LYMPH%: 4.9 % — ABNORMAL LOW (ref 14.0–49.7)
MCH: 31.6 pg (ref 25.1–34.0)
MCHC: 32.6 g/dL (ref 31.5–36.0)
MCV: 96.9 fL (ref 79.5–101.0)
MONO#: 0.2 10*3/uL (ref 0.1–0.9)
MONO%: 3.5 % (ref 0.0–14.0)
NEUT#: 4.3 10*3/uL (ref 1.5–6.5)
NEUT%: 91 % — AB (ref 38.4–76.8)
PLATELETS: 215 10*3/uL (ref 145–400)
RBC: 3.9 10*6/uL (ref 3.70–5.45)
RDW: 13.3 % (ref 11.2–14.5)
WBC: 4.7 10*3/uL (ref 3.9–10.3)

## 2016-03-10 LAB — COMPREHENSIVE METABOLIC PANEL
ALBUMIN: 3.8 g/dL (ref 3.5–5.0)
ALK PHOS: 107 U/L (ref 40–150)
ALT: 50 U/L (ref 0–55)
AST: 47 U/L — AB (ref 5–34)
Anion Gap: 8 mEq/L (ref 3–11)
BUN: 18 mg/dL (ref 7.0–26.0)
CALCIUM: 9.5 mg/dL (ref 8.4–10.4)
CO2: 27 mEq/L (ref 22–29)
CREATININE: 1.1 mg/dL (ref 0.6–1.1)
Chloride: 107 mEq/L (ref 98–109)
EGFR: 52 mL/min/{1.73_m2} — ABNORMAL LOW (ref >90)
Glucose: 135 mg/dl (ref 70–140)
Potassium: 3.9 mEq/L (ref 3.5–5.1)
Sodium: 142 mEq/L (ref 136–145)
Total Protein: 7 g/dL (ref 6.4–8.3)

## 2016-03-14 ENCOUNTER — Other Ambulatory Visit: Payer: Self-pay | Admitting: Hematology and Oncology

## 2016-03-14 DIAGNOSIS — C9000 Multiple myeloma not having achieved remission: Secondary | ICD-10-CM

## 2016-03-14 MED ORDER — HYDROMORPHONE HCL 4 MG PO TABS: 4.0000 mg | ORAL_TABLET | Freq: Four times a day (QID) | ORAL | Status: DC | PRN

## 2016-04-04 ENCOUNTER — Other Ambulatory Visit: Payer: Self-pay | Admitting: *Deleted

## 2016-04-04 MED ORDER — LENALIDOMIDE 5 MG PO CAPS: ORAL_CAPSULE | ORAL | Status: DC

## 2016-04-04 MED ORDER — IXAZOMIB CITRATE 3 MG PO CAPS: ORAL_CAPSULE | ORAL | Status: DC

## 2016-05-02 ENCOUNTER — Other Ambulatory Visit: Payer: Self-pay | Admitting: *Deleted

## 2016-05-02 MED ORDER — LENALIDOMIDE 5 MG PO CAPS: ORAL_CAPSULE | ORAL | Status: DC

## 2016-05-04 ENCOUNTER — Other Ambulatory Visit: Payer: Self-pay | Admitting: *Deleted

## 2016-05-04 MED ORDER — IXAZOMIB CITRATE 3 MG PO CAPS: ORAL_CAPSULE | ORAL | Status: DC

## 2016-05-07 ENCOUNTER — Other Ambulatory Visit: Payer: Self-pay | Admitting: Hematology and Oncology

## 2016-05-26 ENCOUNTER — Other Ambulatory Visit (HOSPITAL_BASED_OUTPATIENT_CLINIC_OR_DEPARTMENT_OTHER): Payer: 59

## 2016-05-26 DIAGNOSIS — C9000 Multiple myeloma not having achieved remission: Secondary | ICD-10-CM | POA: Diagnosis not present

## 2016-05-29 ENCOUNTER — Other Ambulatory Visit: Payer: Self-pay | Admitting: Hematology and Oncology

## 2016-05-29 LAB — KAPPA/LAMBDA LIGHT CHAINS
Ig Kappa Free Light Chain: 370.4 mg/L — ABNORMAL HIGH (ref 3.3–19.4)
Ig Lambda Free Light Chain: 11.5 mg/L (ref 5.7–26.3)
Kappa/Lambda FluidC Ratio: 32.21 — ABNORMAL HIGH (ref 0.26–1.65)

## 2016-05-30 LAB — MULTIPLE MYELOMA PANEL, SERUM
ALBUMIN SERPL ELPH-MCNC: 4 g/dL (ref 2.9–4.4)
ALPHA2 GLOB SERPL ELPH-MCNC: 0.7 g/dL (ref 0.4–1.0)
Albumin/Glob SerPl: 1.7 (ref 0.7–1.7)
Alpha 1: 0.2 g/dL (ref 0.0–0.4)
B-GLOBULIN SERPL ELPH-MCNC: 1.1 g/dL (ref 0.7–1.3)
Gamma Glob SerPl Elph-Mcnc: 0.4 g/dL (ref 0.4–1.8)
Globulin, Total: 2.4 g/dL (ref 2.2–3.9)
IGG (IMMUNOGLOBIN G), SERUM: 440 mg/dL — AB (ref 700–1600)
IgA, Qn, Serum: 124 mg/dL (ref 87–352)
IgM, Qn, Serum: 50 mg/dL (ref 26–217)
M PROTEIN SERPL ELPH-MCNC: 0.2 g/dL — AB
TOTAL PROTEIN: 6.4 g/dL (ref 6.0–8.5)

## 2016-06-02 ENCOUNTER — Ambulatory Visit: Payer: 59

## 2016-06-02 ENCOUNTER — Telehealth: Payer: Self-pay | Admitting: Hematology and Oncology

## 2016-06-02 ENCOUNTER — Ambulatory Visit (HOSPITAL_BASED_OUTPATIENT_CLINIC_OR_DEPARTMENT_OTHER): Payer: 59

## 2016-06-02 ENCOUNTER — Ambulatory Visit (HOSPITAL_BASED_OUTPATIENT_CLINIC_OR_DEPARTMENT_OTHER): Payer: 59 | Admitting: Hematology and Oncology

## 2016-06-02 ENCOUNTER — Encounter: Payer: Self-pay | Admitting: Hematology and Oncology

## 2016-06-02 ENCOUNTER — Other Ambulatory Visit: Payer: Self-pay | Admitting: Hematology and Oncology

## 2016-06-02 VITALS — BP 114/64 | HR 74 | Temp 98.5°F | Resp 17 | Ht 60.0 in | Wt 105.6 lb

## 2016-06-02 DIAGNOSIS — G893 Neoplasm related pain (acute) (chronic): Secondary | ICD-10-CM | POA: Diagnosis not present

## 2016-06-02 DIAGNOSIS — M898X9 Other specified disorders of bone, unspecified site: Secondary | ICD-10-CM

## 2016-06-02 DIAGNOSIS — C9000 Multiple myeloma not having achieved remission: Secondary | ICD-10-CM

## 2016-06-02 LAB — COMPREHENSIVE METABOLIC PANEL
ALBUMIN: 3.7 g/dL (ref 3.5–5.0)
ALK PHOS: 84 U/L (ref 40–150)
ALT: 29 U/L (ref 0–55)
ANION GAP: 10 meq/L (ref 3–11)
AST: 19 U/L (ref 5–34)
BUN: 18.1 mg/dL (ref 7.0–26.0)
CO2: 25 meq/L (ref 22–29)
CREATININE: 1.1 mg/dL (ref 0.6–1.1)
Calcium: 10 mg/dL (ref 8.4–10.4)
Chloride: 105 mEq/L (ref 98–109)
EGFR: 56 mL/min/{1.73_m2} — AB (ref >90)
Glucose: 98 mg/dl (ref 70–140)
Potassium: 3.5 mEq/L (ref 3.5–5.1)
Sodium: 141 mEq/L (ref 136–145)
TOTAL PROTEIN: 6.7 g/dL (ref 6.4–8.3)

## 2016-06-02 LAB — CBC WITH DIFFERENTIAL/PLATELET
BASO%: 0.7 % (ref 0.0–2.0)
Basophils Absolute: 0 10*3/uL (ref 0.0–0.1)
EOS ABS: 0.1 10*3/uL (ref 0.0–0.5)
EOS%: 1.4 % (ref 0.0–7.0)
HEMATOCRIT: 38.8 % (ref 34.8–46.6)
HEMOGLOBIN: 12.8 g/dL (ref 11.6–15.9)
LYMPH#: 0.9 10*3/uL (ref 0.9–3.3)
LYMPH%: 21.8 % (ref 14.0–49.7)
MCH: 31.4 pg (ref 25.1–34.0)
MCHC: 32.9 g/dL (ref 31.5–36.0)
MCV: 95.2 fL (ref 79.5–101.0)
MONO#: 0.7 10*3/uL (ref 0.1–0.9)
MONO%: 15.4 % — ABNORMAL HIGH (ref 0.0–14.0)
NEUT%: 60.7 % (ref 38.4–76.8)
NEUTROS ABS: 2.6 10*3/uL (ref 1.5–6.5)
PLATELETS: 175 10*3/uL (ref 145–400)
RBC: 4.07 10*6/uL (ref 3.70–5.45)
RDW: 14.4 % (ref 11.2–14.5)
WBC: 4.3 10*3/uL (ref 3.9–10.3)

## 2016-06-02 MED ORDER — IXAZOMIB CITRATE 3 MG PO CAPS: 3.0000 mg | ORAL_CAPSULE | ORAL | Status: DC

## 2016-06-02 MED ORDER — HYDROMORPHONE HCL 2 MG PO TABS: 2.0000 mg | ORAL_TABLET | Freq: Four times a day (QID) | ORAL | Status: DC | PRN

## 2016-06-02 MED ORDER — IXAZOMIB CITRATE 3 MG PO CAPS: ORAL_CAPSULE | ORAL | Status: DC

## 2016-06-02 MED ORDER — MORPHINE SULFATE ER 15 MG PO TBCR: 15.0000 mg | EXTENDED_RELEASE_TABLET | Freq: Two times a day (BID) | ORAL | Status: AC

## 2016-06-02 MED ORDER — LENALIDOMIDE 5 MG PO CAPS: ORAL_CAPSULE | ORAL | Status: DC

## 2016-06-02 NOTE — Assessment & Plan Note (Signed)
She has chronic bone pain and ankle pain related to myeloma I refilled her prescription pain medicine. We discussed switching treatment that the patient declined

## 2016-06-02 NOTE — Telephone Encounter (Signed)
per pof to sch pt appt*-sent back to lab-gave avs

## 2016-06-02 NOTE — Progress Notes (Signed)
[facility] OFFICE PROGRESS NOTE  Patient Care Team: Hulan Fess, MD as PCP - General (Family Medicine) Luanne Bras, MD as Consulting Physician (Interventional Radiology) Webb Laws, OD as Consulting Physician (Optometry)  SUMMARY OF ONCOLOGIC HISTORY: Oncology History   Multiple myeloma without remission; Calcium 13.3, Creatinine 2.83, Hg 8.3, Bone lesions present, Beta 2 microglobulin 12.8, Albumin 3.5, IgA 1760 and kappa light chains 1670. Bone marrow biopsy showed 82% involvement.   Primary site: Multiple Myeloma   Staging method: AJCC 6th Edition   Clinical: Stage IIIB signed by Heath Lark, MD on 07/13/2014 12:38 PM   Summary: Stage IIIB       Multiple myeloma without remission ([place])   07/07/2014 - 07/10/2014 Hospital Admission She was admitted to the hospital for management of malignant hypercalcemia and had bone marrow biopsy which confirmed diagnosis of multiple myeloma. The patient received one unit of blood transfusion due to severe anemia.   07/08/2014 Bone Marrow Biopsy The patient had bone marrow biopsy that confirmed diagnosis.   07/08/2014 Pathology Results Accession: [id number] showed 82% involvement of bone marrow.   07/08/2014 - 10/19/2014 Chemotherapy She was started on Velcade along with dexamethasone.   08/21/2014 - 10/19/2014 Chemotherapy I added Revlimid and Zometa to her chemotherapy regimen.   09/24/2014 Pathology Results Accession: [id number] T12 biopsy showed plasma cells involvement.   09/24/2014 Procedure she has kyphoplasty performed.   09/30/2014 Bone Marrow Biopsy Accession: [id number] bone marrow biopsy showed residual plasma cell, approximately 11%   11/06/2014 - 05/07/2015 Chemotherapy She was started on Velcade maintenance every other week, treatment is discontinued due to neuropathic pain.   05/14/2015 - 07/02/2015 Chemotherapy The patient received Revlimid and start taking it as maintenance therapy. Treatment was discontinued due to  side-effects   12/10/2015 -  Chemotherapy She resumed treatment due to disease relapse with Revlimid, dexamethasone and Ninlaro   01/07/2016 Adverse Reaction Treatment was placed on hold due to fatigue and diarrhea. She subsequently resumed treatment at reduced dose    INTERVAL HISTORY: Please see below for problem oriented charting. She returns for further follow-up. She continues to feel fatigue. She continues to have chronic bone pain She denies recent infection  REVIEW OF SYSTEMS:   Constitutional: Denies fevers, chills or abnormal weight loss Eyes: Denies blurriness of vision Ears, nose, mouth, throat, and face: Denies mucositis or sore throat Respiratory: Denies cough, dyspnea or wheezes Cardiovascular: Denies palpitation, chest discomfort or lower extremity swelling Gastrointestinal:  Denies nausea, heartburn or change in bowel habits Skin: Denies abnormal skin rashes Lymphatics: Denies new lymphadenopathy or easy bruising Neurological:Denies numbness, tingling or new weaknesses Behavioral/Psych: Mood is stable, no new changes  All other systems were reviewed with the patient and are negative.  I have reviewed the past medical history, past surgical history, social history and family history with the patient and they are unchanged from previous note.  ALLERGIES:  is allergic to hydrocodone; ace inhibitors; and cephalexin.  MEDICATIONS:  Current Outpatient Prescriptions  Medication Sig Dispense Refill  . acyclovir (ZOVIRAX) 400 MG tablet TAKE 1 TABLET BY MOUTH EVERY DAY 30 tablet 5  . amLODipine (NORVASC) 10 MG tablet TAKE 1 TABLET BY MOUTH EVERY DAY 90 tablet 2  . butalbital-acetaminophen-caffeine (FIORICET, ESGIC) 50-325-40 MG tablet TAKE 1 TABLET BY MOUTH EVERY 6 HOURS AS NEEDED FOR HEADACHE 90 tablet 0  . cholecalciferol (VITAMIN D) 1000 UNITS tablet Take 2,000 Units by mouth every morning.     . [employer] SENNA PLUS 8.6-50 MG per tablet TAKE 2  TABLETS BY MOUTH TWICE A DAY 60  tablet 1  . HYDROmorphone (DILAUDID) 2 MG tablet Take 1 tablet (2 mg total) by mouth every 6 (six) hours as needed for severe pain. 90 tablet 0  . ixazomib citrate (NINLARO) 3 MG capsule Take 1 capsule (3 mg total) by mouth once a week. Take on an empty stomach 1hr before or 2hrs after food. Do not crush, chew or open. 3 capsule 0  . lenalidomide (REVLIMID) 5 MG capsule Take 1 daily for 21 days then off 7 days every 28 days 21 capsule 0  . lidocaine-prilocaine (EMLA) cream Apply 1 application topically as needed. 30 g 6  . morphine (MS CONTIN) 15 MG 12 hr tablet Take 1 tablet (15 mg total) by mouth every 12 (twelve) hours. 60 tablet 0  . ondansetron (ZOFRAN) 4 MG tablet Take 4 mg by mouth every 8 (eight) hours as needed for nausea or vomiting.    . polyethylene glycol (MIRALAX / GLYCOLAX) packet Take 17 g by mouth daily.    . polyvinyl alcohol (LIQUIFILM TEARS) 1.4 % ophthalmic solution Place 1 drop into both eyes as needed for dry eyes.      No current facility-administered medications for this visit.    PHYSICAL EXAMINATION: ECOG PERFORMANCE STATUS: 1 - Symptomatic but completely ambulatory  Filed Vitals:   06/02/16 1328  BP: 114/64  Pulse: 74  Temp: 98.5 F (36.9 C)  Resp: 17   Filed Weights   06/02/16 1328  Weight: 105 lb 9.6 oz (47.9 kg)    GENERAL:alert, no distress and comfortable. She looks thin and cachectic SKIN: skin color, texture, turgor are normal, no rashes or significant lesions EYES: normal, Conjunctiva are pink and non-injected, sclera clear Musculoskeletal:no cyanosis of digits and no clubbing  NEURO: alert & oriented x 3 with fluent speech, no focal motor/sensory deficits  LABORATORY DATA:  I have reviewed the data as listed    Component Value Date/Time   NA 141 06/02/2016 1451   NA 143 09/24/2014 0722   K 3.5 06/02/2016 1451   K 3.8 09/24/2014 0722   CL 105 09/24/2014 0722   CO2 25 06/02/2016 1451   CO2 27 09/24/2014 0722   GLUCOSE 98 06/02/2016 1451    GLUCOSE 80 09/24/2014 0722   BUN 18.1 06/02/2016 1451   BUN 17 09/24/2014 0722   CREATININE 1.1 06/02/2016 1451   CREATININE 1.22* 09/24/2014 0722   CALCIUM 10.0 06/02/2016 1451   CALCIUM 8.6 09/24/2014 0722   PROT 6.7 06/02/2016 1451   PROT 6.4 05/26/2016 1515   PROT 7.2 07/13/2014 1222   ALBUMIN 3.7 06/02/2016 1451   ALBUMIN 3.9 07/13/2014 1222   AST 19 06/02/2016 1451   AST 22 07/13/2014 1222   ALT 29 06/02/2016 1451   ALT 75* 07/13/2014 1222   ALKPHOS 84 06/02/2016 1451   ALKPHOS 90 07/13/2014 1222   BILITOT <0.30 06/02/2016 1451   BILITOT 0.4 07/13/2014 1222   GFRNONAA 47* 09/24/2014 0722   GFRAA 54* 09/24/2014 0722    No results found for: SPEP, UPEP  Lab Results  Component Value Date   WBC 4.3 06/02/2016   NEUTROABS 2.6 06/02/2016   HGB 12.8 06/02/2016   HCT 38.8 06/02/2016   MCV 95.2 06/02/2016   PLT 175 06/02/2016      Chemistry      Component Value Date/Time   NA 141 06/02/2016 1451   NA 143 09/24/2014 0722   K 3.5 06/02/2016 1451   K 3.8 09/24/2014 4259  CL 105 09/24/2014 0722   CO2 25 06/02/2016 1451   CO2 27 09/24/2014 0722   BUN 18.1 06/02/2016 1451   BUN 17 09/24/2014 0722   CREATININE 1.1 06/02/2016 1451   CREATININE 1.22* 09/24/2014 0722      Component Value Date/Time   CALCIUM 10.0 06/02/2016 1451   CALCIUM 8.6 09/24/2014 0722   ALKPHOS 84 06/02/2016 1451   ALKPHOS 90 07/13/2014 1222   AST 19 06/02/2016 1451   AST 22 07/13/2014 1222   ALT 29 06/02/2016 1451   ALT 75* 07/13/2014 1222   BILITOT <0.30 06/02/2016 1451   BILITOT 0.4 07/13/2014 1222       ASSESSMENT & PLAN:  Multiple myeloma without remission I am concerned about the elevated M spike and persistent elevated serum light chains. I felt that were not getting a deep enough response to get her myeloma under control. She only had partial response so far since January of this year. The patient is fairly reluctant to switch treatment. I went through the current  guidelines including the use of combination therapy with Daratumumab and Pomalidomide. After extensive discussion, she is reluctant to be switch to combination treatment. I recommend giving her 1 more treatment with current regimen and repeat blood work again next month. Due to poor venous access, we are not able to give her Zometa today. I will reschedule her Zometa to be given next month. I will call her with test results after next myeloma panel  Bone pain She has chronic bone pain and ankle pain related to myeloma I refilled her prescription pain medicine. We discussed switching treatment that the patient declined    Orders Placed This Encounter  Procedures  . Kappa/lambda light chains    Standing Status: Future     Number of Occurrences:      Standing Expiration Date: 07/07/2017  . Multiple Myeloma Panel (SPEP&IFE w/QIG)    Standing Status: Future     Number of Occurrences:      Standing Expiration Date: 07/07/2017   All questions were answered. The patient knows to call the clinic with any problems, questions or concerns. No barriers to learning was detected. I spent 30 minutes counseling the patient face to face. The total time spent in the appointment was 40 minutes and more than 50% was on counseling and review of test results     [facility], [place]e, MD 06/02/2016 4:20 PM

## 2016-06-02 NOTE — Patient Instructions (Signed)
Daratumumab injection  What is this medicine?  DARATUMUMAB (dar a toom ue mab) is a monoclonal antibody. It is used to treat multiple myeloma.  This medicine may be used for other purposes; ask your health care provider or pharmacist if you have questions.  What should I tell my health care provider before I take this medicine?  They need to know if you have any of these conditions:  -infection (especially a virus infection such as chickenpox, cold sores, or herpes)  -lung or breathing disease  -pregnant or trying to get pregnant  -breast-feeding  -an unusual or allergic reaction to daratumumab, other medicines, foods, dyes, or preservatives  How should I use this medicine?  This medicine is for infusion into a vein. It is given by a health care professional in a hospital or clinic setting.  Talk to your pediatrician regarding the use of this medicine in children. Special care may be needed.  Overdosage: If you think you have taken too much of this medicine contact a poison control center or emergency room at once.  NOTE: This medicine is only for you. Do not share this medicine with others.  What if I miss a dose?  Keep appointments for follow-up doses as directed. It is important not to miss your dose. Call your doctor or health care professional if you are unable to keep an appointment.  What may interact with this medicine?  Interactions have not been studied.  Give your health care provider a list of all the medicines, herbs, non-prescription drugs, or dietary supplements you use. Also tell them if you smoke, drink alcohol, or use illegal drugs. Some items may interact with your medicine.  This list may not describe all possible interactions. Give your health care provider a list of all the medicines, herbs, non-prescription drugs, or dietary supplements you use. Also tell them if you smoke, drink alcohol, or use illegal drugs. Some items may interact with your medicine.  What should I watch for while using  this medicine?  This drug may make you feel generally unwell. Report any side effects. Continue your course of treatment even though you feel ill unless your doctor tells you to stop.  This medicine can cause serious allergic reactions. To reduce your risk you may need to take medicine before treatment with this medicine. Take your medicine as directed.  This medicine can affect the results of blood tests to match your blood type. These changes can last for up to 6 months after the final dose. Your healthcare provider will do blood tests to match your blood type before you start treatment. Tell all of your healthcare providers that you are being treated with this medicine before receiving a blood transfusion.  This medicine can affect the results of some tests used to determine treatment response; extra tests may be needed to evaluate response.  Do not become pregnant while taking this medicine or for 3 months after stopping it. Women should inform their doctor if they wish to become pregnant or think they might be pregnant. There is a potential for serious side effects to an unborn child. Talk to your health care professional or pharmacist for more information.  What side effects may I notice from receiving this medicine?  Side effects that you should report to your doctor or health care professional as soon as possible:  -allergic reactions like skin rash, itching or hives, swelling of the face, lips, or tongue  -breathing problems  -chills  -cough  -dizziness  -  back pain -fever -joint pain -loss of appetite -tiredness This list may not describe all possible side effects. Call your doctor for medical advice about side effects. You may report side effects to FDA at  1-800-FDA-1088. Where should I keep my medicine? Keep out of the reach of children. This drug is given in a hospital or clinic and will not be stored at home. NOTE: This sheet is a summary. It may not cover all possible information. If you have questions about this medicine, talk to your doctor, pharmacist, or health care provider.    2016, [employer]vier/Gold Standard. (2015-01-19 17:02:23)

## 2016-06-02 NOTE — Assessment & Plan Note (Signed)
I am concerned about the elevated M spike and persistent elevated serum light chains. I felt that were not getting a deep enough response to get her myeloma under control. She only had partial response so far since January of this year. The patient is fairly reluctant to switch treatment. I went through the current guidelines including the use of combination therapy with Daratumumab and Pomalidomide. After extensive discussion, she is reluctant to be switch to combination treatment. I recommend giving her 1 more treatment with current regimen and repeat blood work again next month. Due to poor venous access, we are not able to give her Zometa today. I will reschedule her Zometa to be given next month. I will call her with test results after next myeloma panel

## 2016-06-04 ENCOUNTER — Telehealth: Payer: Self-pay | Admitting: Hematology and Oncology

## 2016-06-04 NOTE — Telephone Encounter (Signed)
s.w. pt and advised on 7.28 appt....pt ok and aware

## 2016-06-14 ENCOUNTER — Telehealth: Payer: Self-pay | Admitting: *Deleted

## 2016-06-14 ENCOUNTER — Other Ambulatory Visit: Payer: Self-pay | Admitting: Hematology and Oncology

## 2016-06-14 ENCOUNTER — Ambulatory Visit (HOSPITAL_BASED_OUTPATIENT_CLINIC_OR_DEPARTMENT_OTHER): Payer: 59

## 2016-06-14 DIAGNOSIS — C9000 Multiple myeloma not having achieved remission: Secondary | ICD-10-CM | POA: Diagnosis not present

## 2016-06-14 DIAGNOSIS — R300 Dysuria: Secondary | ICD-10-CM

## 2016-06-14 LAB — URINALYSIS, MICROSCOPIC - CHCC
BILIRUBIN (URINE): NEGATIVE
Blood: NEGATIVE
Glucose: NEGATIVE mg/dL
KETONES: NEGATIVE mg/dL
Leukocyte Esterase: NEGATIVE
Nitrite: NEGATIVE
Protein: NEGATIVE mg/dL
RBC / HPF: NEGATIVE (ref 0–2)
SPECIFIC GRAVITY, URINE: 1.005 (ref 1.003–1.035)
Urobilinogen, UR: 0.2 mg/dL (ref 0.2–1)
pH: 7 (ref 4.6–8.0)

## 2016-06-14 LAB — CBC WITH DIFFERENTIAL/PLATELET
BASO%: 0.6 % (ref 0.0–2.0)
BASOS ABS: 0 10*3/uL (ref 0.0–0.1)
EOS ABS: 0 10*3/uL (ref 0.0–0.5)
EOS%: 0.9 % (ref 0.0–7.0)
HEMATOCRIT: 38 % (ref 34.8–46.6)
HEMOGLOBIN: 12.7 g/dL (ref 11.6–15.9)
LYMPH#: 0.6 10*3/uL — AB (ref 0.9–3.3)
LYMPH%: 16.8 % (ref 14.0–49.7)
MCH: 31.7 pg (ref 25.1–34.0)
MCHC: 33.3 g/dL (ref 31.5–36.0)
MCV: 94.9 fL (ref 79.5–101.0)
MONO#: 0.3 10*3/uL (ref 0.1–0.9)
MONO%: 8.1 % (ref 0.0–14.0)
NEUT#: 2.9 10*3/uL (ref 1.5–6.5)
NEUT%: 73.6 % (ref 38.4–76.8)
PLATELETS: 169 10*3/uL (ref 145–400)
RBC: 4 10*6/uL (ref 3.70–5.45)
RDW: 14.3 % (ref 11.2–14.5)
WBC: 3.9 10*3/uL (ref 3.9–10.3)

## 2016-06-14 MED ORDER — NITROFURANTOIN MACROCRYSTAL 50 MG PO CAPS: 50.0000 mg | ORAL_CAPSULE | Freq: Four times a day (QID) | ORAL | Status: DC

## 2016-06-14 NOTE — Telephone Encounter (Signed)
Pt lvm has UTI symptoms of burning and frequency x one week.  She also c/o chills and sweats and "below normal temperature."   Pt states was up 10 times last night to urinate.   Dr. Alvy Bimler instructs for pt to come in today for u/a c&s and cbc.   She also prescribed Macrodantin which I sent to her pharmacy. Instructed pt to come in for urine test and cbc and start macrodantin after she has her tests.  We will call her w/ the results.  Pt verbalized understanding.

## 2016-06-15 ENCOUNTER — Telehealth: Payer: Self-pay | Admitting: *Deleted

## 2016-06-15 LAB — URINE CULTURE

## 2016-06-15 NOTE — Telephone Encounter (Signed)
-----   Message from Heath Lark, MD sent at 06/15/2016  3:51 PM EDT ----- Regarding: urine culture Urine culture showed mixed growth? Is she better on antibiotics? If so, continue until her course is finished If not, tell her to stop ----- Message -----    From: [facility]    Sent: 06/14/2016  11:10 AM      To: Heath Lark, MD

## 2016-06-15 NOTE — Telephone Encounter (Signed)
LVM for pt informing her of Dr. Calton Dach message.  Asked her to call nurse back.

## 2016-06-16 ENCOUNTER — Telehealth: Payer: Self-pay | Admitting: *Deleted

## 2016-06-16 NOTE — Telephone Encounter (Signed)
Pt left VM states she has felt better on antibiotic.  Her urinary symptoms and chills have improved so she plans on completing course of antibiotics as prescribed.

## 2016-06-28 ENCOUNTER — Other Ambulatory Visit: Payer: Self-pay | Admitting: *Deleted

## 2016-06-28 DIAGNOSIS — C9000 Multiple myeloma not having achieved remission: Secondary | ICD-10-CM

## 2016-06-28 MED ORDER — HYDROMORPHONE HCL 2 MG PO TABS: 2.0000 mg | ORAL_TABLET | Freq: Four times a day (QID) | ORAL | 0 refills | Status: DC | PRN

## 2016-06-28 NOTE — Telephone Encounter (Signed)
Pt left VM requesting refill on Dilaudid 2 mg to pick up when she comes in for her appt on Friday.   Called pt and left her VM informing of Rx ready to pick up.

## 2016-06-30 ENCOUNTER — Other Ambulatory Visit (HOSPITAL_BASED_OUTPATIENT_CLINIC_OR_DEPARTMENT_OTHER): Payer: 59

## 2016-06-30 ENCOUNTER — Ambulatory Visit (HOSPITAL_BASED_OUTPATIENT_CLINIC_OR_DEPARTMENT_OTHER): Payer: 59

## 2016-06-30 VITALS — BP 140/70 | HR 66 | Temp 98.6°F | Resp 16

## 2016-06-30 DIAGNOSIS — C9000 Multiple myeloma not having achieved remission: Secondary | ICD-10-CM

## 2016-06-30 LAB — CBC WITH DIFFERENTIAL/PLATELET
BASO%: 0.5 % (ref 0.0–2.0)
Basophils Absolute: 0 10*3/uL (ref 0.0–0.1)
EOS ABS: 0.2 10*3/uL (ref 0.0–0.5)
EOS%: 3.9 % (ref 0.0–7.0)
HEMATOCRIT: 36.3 % (ref 34.8–46.6)
HEMOGLOBIN: 12.4 g/dL (ref 11.6–15.9)
LYMPH#: 0.8 10*3/uL — AB (ref 0.9–3.3)
LYMPH%: 21.3 % (ref 14.0–49.7)
MCH: 32 pg (ref 25.1–34.0)
MCHC: 34.2 g/dL (ref 31.5–36.0)
MCV: 93.8 fL (ref 79.5–101.0)
MONO#: 0.5 10*3/uL (ref 0.1–0.9)
MONO%: 11.8 % (ref 0.0–14.0)
NEUT%: 62.5 % (ref 38.4–76.8)
NEUTROS ABS: 2.4 10*3/uL (ref 1.5–6.5)
Platelets: 144 10*3/uL — ABNORMAL LOW (ref 145–400)
RBC: 3.87 10*6/uL (ref 3.70–5.45)
RDW: 14.1 % (ref 11.2–14.5)
WBC: 3.8 10*3/uL — AB (ref 3.9–10.3)

## 2016-06-30 LAB — COMPREHENSIVE METABOLIC PANEL
ALBUMIN: 3.9 g/dL (ref 3.5–5.0)
ALK PHOS: 67 U/L (ref 40–150)
ALT: 22 U/L (ref 0–55)
AST: 16 U/L (ref 5–34)
Anion Gap: 11 mEq/L (ref 3–11)
BUN: 16.7 mg/dL (ref 7.0–26.0)
CALCIUM: 9.3 mg/dL (ref 8.4–10.4)
CO2: 22 mEq/L (ref 22–29)
CREATININE: 1.1 mg/dL (ref 0.6–1.1)
Chloride: 108 mEq/L (ref 98–109)
EGFR: 55 mL/min/{1.73_m2} — ABNORMAL LOW (ref >90)
GLUCOSE: 98 mg/dL (ref 70–140)
Potassium: 3.4 mEq/L — ABNORMAL LOW (ref 3.5–5.1)
SODIUM: 142 meq/L (ref 136–145)
TOTAL PROTEIN: 6.6 g/dL (ref 6.4–8.3)

## 2016-06-30 MED ORDER — SODIUM CHLORIDE 0.9 % IV SOLN
Freq: Once | INTRAVENOUS | Status: AC
  Administered 2016-06-30: 14:00:00 via INTRAVENOUS

## 2016-06-30 MED ORDER — ZOLEDRONIC ACID 4 MG/100ML IV SOLN
4.0000 mg | Freq: Once | INTRAVENOUS | Status: AC
Start: 2016-06-30 — End: 2016-06-30
  Administered 2016-06-30: 4 mg via INTRAVENOUS
  Filled 2016-06-30: qty 100

## 2016-06-30 NOTE — Patient Instructions (Signed)

## 2016-07-03 LAB — KAPPA/LAMBDA LIGHT CHAINS
Ig Kappa Free Light Chain: 292.6 mg/L — ABNORMAL HIGH (ref 3.3–19.4)
Ig Lambda Free Light Chain: 10.6 mg/L (ref 5.7–26.3)
Kappa/Lambda FluidC Ratio: 27.6 — ABNORMAL HIGH (ref 0.26–1.65)

## 2016-07-04 LAB — MULTIPLE MYELOMA PANEL, SERUM
ALBUMIN/GLOB SERPL: 1.7 (ref 0.7–1.7)
Albumin SerPl Elph-Mcnc: 3.9 g/dL (ref 2.9–4.4)
Alpha 1: 0.2 g/dL (ref 0.0–0.4)
Alpha2 Glob SerPl Elph-Mcnc: 0.7 g/dL (ref 0.4–1.0)
B-Globulin SerPl Elph-Mcnc: 1 g/dL (ref 0.7–1.3)
Gamma Glob SerPl Elph-Mcnc: 0.4 g/dL (ref 0.4–1.8)
Globulin, Total: 2.3 g/dL (ref 2.2–3.9)
IGA/IMMUNOGLOBULIN A, SERUM: 103 mg/dL (ref 87–352)
IGM (IMMUNOGLOBIN M), SRM: 46 mg/dL (ref 26–217)
IgG, Qn, Serum: 411 mg/dL — ABNORMAL LOW (ref 700–1600)
Total Protein: 6.2 g/dL (ref 6.0–8.5)

## 2016-07-05 ENCOUNTER — Other Ambulatory Visit: Payer: Self-pay | Admitting: Hematology and Oncology

## 2016-07-05 ENCOUNTER — Other Ambulatory Visit: Payer: Self-pay | Admitting: *Deleted

## 2016-07-05 ENCOUNTER — Telehealth: Payer: Self-pay | Admitting: Hematology and Oncology

## 2016-07-05 ENCOUNTER — Telehealth: Payer: Self-pay | Admitting: *Deleted

## 2016-07-05 DIAGNOSIS — C9000 Multiple myeloma not having achieved remission: Secondary | ICD-10-CM

## 2016-07-05 MED ORDER — LENALIDOMIDE 5 MG PO CAPS: ORAL_CAPSULE | ORAL | 0 refills | Status: DC

## 2016-07-05 MED ORDER — HYDROMORPHONE HCL 2 MG PO TABS: 2.0000 mg | ORAL_TABLET | Freq: Four times a day (QID) | ORAL | 0 refills | Status: DC | PRN

## 2016-07-05 NOTE — Telephone Encounter (Signed)
left msg confirming sept apt times

## 2016-07-05 NOTE — Telephone Encounter (Signed)
Lm on vm for patient to call me.

## 2016-07-10 ENCOUNTER — Telehealth: Payer: Self-pay | Admitting: *Deleted

## 2016-07-10 NOTE — Telephone Encounter (Signed)
[facility] called pt to arrange delivery of her Revlimid and Ninlaro but pt thinks she is only supposed to take the Revlimid?  Has Ninlaro been d/c'd?

## 2016-07-10 NOTE — Telephone Encounter (Signed)
Returned call to [facility] and notified that Dr. Alvy Bimler has not d/c'd Marissa Parks and wants pt to continue on Revlimid and Ninlaro as prescribed.  Pharmacy will call pt back to arrange delivery for both meds as prescribed and have pt call our office if any questions.

## 2016-07-14 MED ORDER — FAMOTIDINE IN NACL 20-0.9 MG/50ML-% IV SOLN
INTRAVENOUS | Status: AC
  Filled 2016-07-14: qty 50

## 2016-07-14 MED ORDER — DIPHENHYDRAMINE HCL 50 MG/ML IJ SOLN
INTRAMUSCULAR | Status: AC
  Filled 2016-07-14: qty 1

## 2016-07-19 ENCOUNTER — Other Ambulatory Visit: Payer: Self-pay | Admitting: *Deleted

## 2016-07-19 DIAGNOSIS — C9000 Multiple myeloma not having achieved remission: Secondary | ICD-10-CM

## 2016-07-19 MED ORDER — HYDROMORPHONE HCL 2 MG PO TABS: 2.0000 mg | ORAL_TABLET | Freq: Four times a day (QID) | ORAL | 0 refills | Status: DC | PRN

## 2016-07-21 MED ORDER — FAMOTIDINE IN NACL 20-0.9 MG/50ML-% IV SOLN
INTRAVENOUS | Status: AC
  Filled 2016-07-21: qty 50

## 2016-07-31 ENCOUNTER — Other Ambulatory Visit: Payer: Self-pay | Admitting: Hematology and Oncology

## 2016-08-01 ENCOUNTER — Other Ambulatory Visit: Payer: Self-pay | Admitting: Hematology and Oncology

## 2016-08-04 ENCOUNTER — Other Ambulatory Visit (HOSPITAL_BASED_OUTPATIENT_CLINIC_OR_DEPARTMENT_OTHER): Payer: 59

## 2016-08-04 DIAGNOSIS — C9000 Multiple myeloma not having achieved remission: Secondary | ICD-10-CM

## 2016-08-04 LAB — COMPREHENSIVE METABOLIC PANEL
ALBUMIN: 3.8 g/dL (ref 3.5–5.0)
ALK PHOS: 63 U/L (ref 40–150)
ALT: 28 U/L (ref 0–55)
AST: 19 U/L (ref 5–34)
Anion Gap: 8 mEq/L (ref 3–11)
BUN: 23.2 mg/dL (ref 7.0–26.0)
CO2: 28 meq/L (ref 22–29)
Calcium: 9.3 mg/dL (ref 8.4–10.4)
Chloride: 105 mEq/L (ref 98–109)
Creatinine: 1 mg/dL (ref 0.6–1.1)
EGFR: 57 mL/min/{1.73_m2} — ABNORMAL LOW (ref >90)
GLUCOSE: 90 mg/dL (ref 70–140)
POTASSIUM: 4.4 meq/L (ref 3.5–5.1)
SODIUM: 141 meq/L (ref 136–145)
TOTAL PROTEIN: 6.5 g/dL (ref 6.4–8.3)

## 2016-08-04 LAB — CBC WITH DIFFERENTIAL/PLATELET
BASO%: 0.7 % (ref 0.0–2.0)
Basophils Absolute: 0 10*3/uL (ref 0.0–0.1)
EOS%: 4.7 % (ref 0.0–7.0)
Eosinophils Absolute: 0.2 10*3/uL (ref 0.0–0.5)
HCT: 36.3 % (ref 34.8–46.6)
HEMOGLOBIN: 12 g/dL (ref 11.6–15.9)
LYMPH%: 19.2 % (ref 14.0–49.7)
MCH: 32.4 pg (ref 25.1–34.0)
MCHC: 33.1 g/dL (ref 31.5–36.0)
MCV: 97.7 fL (ref 79.5–101.0)
MONO#: 0.7 10*3/uL (ref 0.1–0.9)
MONO%: 17.8 % — AB (ref 0.0–14.0)
NEUT%: 57.6 % (ref 38.4–76.8)
NEUTROS ABS: 2.3 10*3/uL (ref 1.5–6.5)
Platelets: 172 10*3/uL (ref 145–400)
RBC: 3.71 10*6/uL (ref 3.70–5.45)
RDW: 15.4 % — AB (ref 11.2–14.5)
WBC: 4 10*3/uL (ref 3.9–10.3)
lymph#: 0.8 10*3/uL — ABNORMAL LOW (ref 0.9–3.3)

## 2016-08-11 ENCOUNTER — Ambulatory Visit (HOSPITAL_BASED_OUTPATIENT_CLINIC_OR_DEPARTMENT_OTHER): Payer: 59 | Admitting: Hematology and Oncology

## 2016-08-11 ENCOUNTER — Telehealth: Payer: Self-pay | Admitting: Hematology and Oncology

## 2016-08-11 ENCOUNTER — Ambulatory Visit: Payer: 59

## 2016-08-11 VITALS — BP 137/79 | HR 80 | Temp 98.0°F | Resp 16 | Ht 60.0 in | Wt 101.4 lb

## 2016-08-11 DIAGNOSIS — Z515 Encounter for palliative care: Secondary | ICD-10-CM | POA: Diagnosis not present

## 2016-08-11 DIAGNOSIS — G893 Neoplasm related pain (acute) (chronic): Secondary | ICD-10-CM

## 2016-08-11 DIAGNOSIS — C9000 Multiple myeloma not having achieved remission: Secondary | ICD-10-CM

## 2016-08-11 DIAGNOSIS — R64 Cachexia: Secondary | ICD-10-CM | POA: Diagnosis not present

## 2016-08-11 DIAGNOSIS — C9002 Multiple myeloma in relapse: Secondary | ICD-10-CM | POA: Diagnosis not present

## 2016-08-11 MED ORDER — DEXAMETHASONE 4 MG PO TABS: 4.0000 mg | ORAL_TABLET | Freq: Every day | ORAL | 1 refills | Status: DC

## 2016-08-11 MED ORDER — HYDROMORPHONE HCL 8 MG PO TABS: 8.0000 mg | ORAL_TABLET | Freq: Four times a day (QID) | ORAL | 0 refills | Status: DC | PRN

## 2016-08-11 NOTE — Telephone Encounter (Signed)
avs report and appt schd given per 08/11/16 los.

## 2016-08-12 ENCOUNTER — Encounter: Payer: Self-pay | Admitting: Hematology and Oncology

## 2016-08-12 DIAGNOSIS — Z515 Encounter for palliative care: Secondary | ICD-10-CM | POA: Insufficient documentation

## 2016-08-12 DIAGNOSIS — G893 Neoplasm related pain (acute) (chronic): Secondary | ICD-10-CM | POA: Insufficient documentation

## 2016-08-12 DIAGNOSIS — R64 Cachexia: Secondary | ICD-10-CM | POA: Insufficient documentation

## 2016-08-12 NOTE — Assessment & Plan Note (Signed)
The patient is aware she has stage IV disease and treatment is strictly palliative. We discussed importance of Advanced Directives and Living will. We discussed CODE STATUS; the patient desires to DNR. We also discussed potential referral for home base palliative care but the patient declined for now.

## 2016-08-12 NOTE — Assessment & Plan Note (Signed)
I had a long discussion with the patient today. She is suffering from uncontrolled pain and is in moderate distress. She wants to stop all her treatment. After extensive discussion, she is in agreement to trial daily dexamethasone 4 mg and to hold her oral chemotherapy. We will not proceed with Zometa. I will see her back in 2 weeks for symptom management

## 2016-08-12 NOTE — Assessment & Plan Note (Signed)
She has cancer associated pain. I recommend increasing hydromorphone to 4-8 mg every 4-6 hours as needed for pain control. I will also add daily dexamethasone. She agreed to proceed and I will reassess her pain control visit

## 2016-08-12 NOTE — Assessment & Plan Note (Signed)
She has cancer cachexia, likely multifactorial related to poorly controlled pain, progression of disease and mild depression. I will reassess all the above when I see her back next visit. Hopefully, the addition of dexamethasone will improve her appetite

## 2016-08-12 NOTE — Progress Notes (Signed)
[place] OFFICE PROGRESS NOTE  Patient Care Team: Hulan Fess, MD as PCP - General (Family Medicine) Luanne Bras, MD as Consulting Physician (Interventional Radiology) Webb Laws, OD as Consulting Physician (Optometry)  SUMMARY OF ONCOLOGIC HISTORY: Oncology History   Multiple myeloma without remission; Calcium 13.3, Creatinine 2.83, Hg 8.3, Bone lesions present, Beta 2 microglobulin 12.8, Albumin 3.5, IgA 1760 and kappa light chains 1670. Bone marrow biopsy showed 82% involvement.   Primary site: Multiple Myeloma   Staging method: AJCC 6th Edition   Clinical: Stage IIIB signed by Heath Lark, MD on 07/13/2014 12:38 PM   Summary: Stage IIIB       Multiple myeloma without remission ([place])   07/07/2014 - 07/10/2014 Hospital Admission    She was admitted to the hospital for management of malignant hypercalcemia and had bone marrow biopsy which confirmed diagnosis of multiple myeloma. The patient received one unit of blood transfusion due to severe anemia.      07/08/2014 Bone Marrow Biopsy    The patient had bone marrow biopsy that confirmed diagnosis.      07/08/2014 Pathology Results    Accession: [id number] showed 82% involvement of bone marrow.      07/08/2014 - 10/19/2014 Chemotherapy    She was started on Velcade along with dexamethasone.      08/21/2014 - 10/19/2014 Chemotherapy    I added Revlimid and Zometa to her chemotherapy regimen.      09/24/2014 Pathology Results    Accession: [id number] biopsy showed plasma cells involvement.      09/24/2014 Procedure    she has kyphoplasty performed.      09/30/2014 Bone Marrow Biopsy    Accession: [id number] bone marrow biopsy showed residual plasma cell, approximately 11%      11/06/2014 - 05/07/2015 Chemotherapy    She was started on Velcade maintenance every other week, treatment is discontinued due to neuropathic pain.      05/14/2015 - 07/02/2015 Chemotherapy    The patient received Revlimid  and start taking it as maintenance therapy. Treatment was discontinued due to side-effects      12/10/2015 -  Chemotherapy    She resumed treatment due to disease relapse with Revlimid, dexamethasone and Ninlaro      01/07/2016 Adverse Reaction    Treatment was placed on hold due to fatigue and diarrhea. She subsequently resumed treatment at reduced dose       INTERVAL HISTORY: Please see below for problem oriented charting. She returns today for further follow-up. The patient is tearful. She has severe, poorly controlled pain with poor sleep. She have lost some appetite and weight. The patient once to stop all treatment. Her current goal of life is to continue working until next spring. She denies recent infection. She denies recent nausea or constipation.  REVIEW OF SYSTEMS:   Eyes: Denies blurriness of vision Ears, nose, mouth, throat, and face: Denies mucositis or sore throat Respiratory: Denies cough, dyspnea or wheezes Cardiovascular: Denies palpitation, chest discomfort or lower extremity swelling Gastrointestinal:  Denies nausea, heartburn or change in bowel habits Skin: Denies abnormal skin rashes Lymphatics: Denies new lymphadenopathy or easy bruising Neurological:Denies numbness, tingling or new weaknesses All other systems were reviewed with the patient and are negative.  I have reviewed the past medical history, past surgical history, social history and family history with the patient and they are unchanged from previous note.  ALLERGIES:  is allergic to hydrocodone; ace inhibitors; and cephalexin.  MEDICATIONS:  Current Outpatient Prescriptions  Medication Sig Dispense Refill  . acyclovir (ZOVIRAX) 400 MG tablet TAKE 1 TABLET BY MOUTH EVERY DAY 30 tablet 5  . acyclovir (ZOVIRAX) 400 MG tablet TAKE 1 TABLET BY MOUTH EVERY DAY 30 tablet 5  . amLODipine (NORVASC) 10 MG tablet TAKE 1 TABLET BY MOUTH EVERY DAY 90 tablet 2  . butalbital-acetaminophen-caffeine  (FIORICET, ESGIC) 50-325-40 MG tablet TAKE 1 TABLET BY MOUTH EVERY 6 HOURS AS NEEDED FOR HEADACHE 90 tablet 0  . cholecalciferol (VITAMIN D) 1000 UNITS tablet Take 2,000 Units by mouth every morning.     . [facility] SENNA PLUS 8.6-50 MG per tablet TAKE 2 TABLETS BY MOUTH TWICE A DAY 60 tablet 1  . HYDROmorphone (DILAUDID) 8 MG tablet Take 1 tablet (8 mg total) by mouth every 6 (six) hours as needed for severe pain. 90 tablet 0  . ixazomib citrate (NINLARO) 3 MG capsule Take 1 capsule (3 mg total) by mouth once a week. Take on an empty stomach 1hr before or 2hrs after food. Do not crush, chew or open. 3 capsule 0  . lenalidomide (REVLIMID) 5 MG capsule Take 1 daily for 21 days then off 7 days every 28 days. Authorization # [id number] 21 capsule 0  . lidocaine-prilocaine (EMLA) cream Apply 1 application topically as needed. 30 g 6  . morphine (MS CONTIN) 15 MG 12 hr tablet Take 1 tablet (15 mg total) by mouth every 12 (twelve) hours. 60 tablet 0  . ondansetron (ZOFRAN) 4 MG tablet Take 4 mg by mouth every 8 (eight) hours as needed for nausea or vomiting.    . polyethylene glycol (MIRALAX / GLYCOLAX) packet Take 17 g by mouth daily.    . polyvinyl alcohol (LIQUIFILM TEARS) 1.4 % ophthalmic solution Place 1 drop into both eyes as needed for dry eyes.     Marland Kitchen dexamethasone (DECADRON) 4 MG tablet Take 1 tablet (4 mg total) by mouth daily. 90 tablet 1   No current facility-administered medications for this visit.     PHYSICAL EXAMINATION: ECOG PERFORMANCE STATUS: 2 - Symptomatic, <50% confined to bed  Vitals:   08/11/16 1505  BP: 137/79  Pulse: 80  Resp: 16  Temp: 98 F (36.7 C)   Filed Weights   08/11/16 1505  Weight: 101 lb 6.4 oz (46 kg)    GENERAL:alert, In moderate distress and tearful. She looks thin and cachectic SKIN: skin color, texture, turgor are normal, no rashes or significant lesions EYES: normal, Conjunctiva are pink and non-injected, sclera clear Musculoskeletal:no cyanosis of digits  and no clubbing  NEURO: alert & oriented x 3 with fluent speech, no focal motor/sensory deficits  LABORATORY DATA:  I have reviewed the data as listed    Component Value Date/Time   NA 141 08/04/2016 1515   K 4.4 08/04/2016 1515   CL 105 09/24/2014 0722   CO2 28 08/04/2016 1515   GLUCOSE 90 08/04/2016 1515   BUN 23.2 08/04/2016 1515   CREATININE 1.0 08/04/2016 1515   CALCIUM 9.3 08/04/2016 1515   PROT 6.5 08/04/2016 1515   ALBUMIN 3.8 08/04/2016 1515   AST 19 08/04/2016 1515   ALT 28 08/04/2016 1515   ALKPHOS 63 08/04/2016 1515   BILITOT <0.30 08/04/2016 1515   GFRNONAA 47 (L) 09/24/2014 0722   GFRAA 54 (L) 09/24/2014 0722    No results found for: SPEP, UPEP  Lab Results  Component Value Date   WBC 4.0 08/04/2016   NEUTROABS 2.3 08/04/2016   HGB 12.0 08/04/2016   HCT 36.3  08/04/2016   MCV 97.7 08/04/2016   PLT 172 08/04/2016      Chemistry      Component Value Date/Time   NA 141 08/04/2016 1515   K 4.4 08/04/2016 1515   CL 105 09/24/2014 0722   CO2 28 08/04/2016 1515   BUN 23.2 08/04/2016 1515   CREATININE 1.0 08/04/2016 1515      Component Value Date/Time   CALCIUM 9.3 08/04/2016 1515   ALKPHOS 63 08/04/2016 1515   AST 19 08/04/2016 1515   ALT 28 08/04/2016 1515   BILITOT <0.30 08/04/2016 1515      ASSESSMENT & PLAN:  Multiple myeloma without remission I had a long discussion with the patient today. She is suffering from uncontrolled pain and is in moderate distress. She wants to stop all her treatment. After extensive discussion, she is in agreement to trial daily dexamethasone 4 mg and to hold her oral chemotherapy. We will not proceed with Zometa. I will see her back in 2 weeks for symptom management  Cancer associated pain She has cancer associated pain. I recommend increasing hydromorphone to 4-8 mg every 4-6 hours as needed for pain control. I will also add daily dexamethasone. 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 I will reassess all the above when I see her back next visit. Hopefully, the addition of dexamethasone will improve her appetite  Quality of life palliative care encounter The patient is aware she has stage IV disease and treatment is strictly palliative. We discussed importance of Advanced Directives and Living will. We discussed CODE STATUS; the patient desires to DNR. We also discussed potential referral for home base palliative care but the patient declined for now.    Orders Placed This Encounter  Procedures  . Kappa/lambda light chains    Standing Status:   Future    Standing Expiration Date:   09/15/2017  . Multiple Myeloma Panel (SPEP&IFE w/QIG)    Standing Status:   Future    Standing Expiration Date:   09/15/2017   All questions were answered. The patient knows to call the clinic with any problems, questions or concerns. No barriers to learning was detected. I spent 30 minutes counseling the patient face to face. The total time spent in the appointment was 40 minutes and more than 50% was on counseling and review of test results     [facility], [place], MD 08/12/2016 2:19 PM

## 2016-08-29 ENCOUNTER — Ambulatory Visit (HOSPITAL_BASED_OUTPATIENT_CLINIC_OR_DEPARTMENT_OTHER): Payer: 59 | Admitting: Hematology and Oncology

## 2016-08-29 DIAGNOSIS — R64 Cachexia: Secondary | ICD-10-CM

## 2016-08-29 DIAGNOSIS — C9000 Multiple myeloma not having achieved remission: Secondary | ICD-10-CM | POA: Diagnosis not present

## 2016-08-29 DIAGNOSIS — Z7189 Other specified counseling: Secondary | ICD-10-CM

## 2016-08-29 DIAGNOSIS — G893 Neoplasm related pain (acute) (chronic): Secondary | ICD-10-CM

## 2016-08-29 MED ORDER — HYDROMORPHONE HCL 8 MG PO TABS
8.0000 mg | ORAL_TABLET | Freq: Four times a day (QID) | ORAL | 0 refills | Status: DC | PRN
Start: 2016-08-29 — End: 2016-09-28

## 2016-08-29 MED ORDER — ONDANSETRON HCL 4 MG PO TABS: 4.0000 mg | ORAL_TABLET | Freq: Three times a day (TID) | ORAL | 3 refills | Status: AC | PRN

## 2016-08-30 ENCOUNTER — Encounter: Payer: Self-pay | Admitting: Hematology and Oncology

## 2016-08-30 DIAGNOSIS — Z7189 Other specified counseling: Secondary | ICD-10-CM | POA: Insufficient documentation

## 2016-08-30 NOTE — Assessment & Plan Note (Signed)
Her appetite has improved with regular dosage of dexamethasone. I recommend reducing the frequency of dexamethasone to every other day

## 2016-08-30 NOTE — Assessment & Plan Note (Signed)
She has excellent control of her pain with 4 mg Dilaudid. I suspect the regular dosage of dexamethasone also has helped with her bone pain. I have started slow taper of dexamethasone. I refill her prescription of Dilaudid today and will reassess her bone pain again in her next visit

## 2016-08-30 NOTE — Progress Notes (Signed)
[place] OFFICE PROGRESS NOTE  Patient Care Team: Hulan Fess, MD as PCP - General (Family Medicine) Luanne Bras, MD as Consulting Physician (Interventional Radiology) Webb Laws, OD as Consulting Physician (Optometry)  SUMMARY OF ONCOLOGIC HISTORY: Oncology History   Multiple myeloma without remission; Calcium 13.3, Creatinine 2.83, Hg 8.3, Bone lesions present, Beta 2 microglobulin 12.8, Albumin 3.5, IgA 1760 and kappa light chains 1670. Bone marrow biopsy showed 82% involvement.   Primary site: Multiple Myeloma   Staging method: AJCC 6th Edition   Clinical: Stage IIIB signed by Heath Lark, MD on 07/13/2014 12:38 PM   Summary: Stage IIIB       Multiple myeloma without remission ([place])   07/07/2014 - 07/10/2014 Hospital Admission    She was admitted to the hospital for management of malignant hypercalcemia and had bone marrow biopsy which confirmed diagnosis of multiple myeloma. The patient received one unit of blood transfusion due to severe anemia.      07/08/2014 Bone Marrow Biopsy    The patient had bone marrow biopsy that confirmed diagnosis.      07/08/2014 Pathology Results    Accession: [id number] showed 82% involvement of bone marrow.      07/08/2014 - 10/19/2014 Chemotherapy    She was started on Velcade along with dexamethasone.      08/21/2014 - 10/19/2014 Chemotherapy    I added Revlimid and Zometa to her chemotherapy regimen.      09/24/2014 Pathology Results    Accession: [id number] biopsy showed plasma cells involvement.      09/24/2014 Procedure    she has kyphoplasty performed.      09/30/2014 Bone Marrow Biopsy    Accession: [id number] bone marrow biopsy showed residual plasma cell, approximately 11%      11/06/2014 - 05/07/2015 Chemotherapy    She was started on Velcade maintenance every other week, treatment is discontinued due to neuropathic pain.      05/14/2015 - 07/02/2015 Chemotherapy    The patient received Revlimid  and start taking it as maintenance therapy. Treatment was discontinued due to side-effects      12/10/2015 -  Chemotherapy    She resumed treatment due to disease relapse with Revlimid, dexamethasone and Ninlaro      01/07/2016 Adverse Reaction    Treatment was placed on hold due to fatigue and diarrhea. She subsequently resumed treatment at reduced dose       INTERVAL HISTORY: Please see below for problem oriented charting. She returns for follow-up. Her appetite has improved and energy level is excellent. Her bone pain is under excellent control with Dilaudid 4 mg every 6 hours as needed along with 2 mg of dexamethasone daily.  REVIEW OF SYSTEMS:   Constitutional: Denies fevers, chills or abnormal weight loss Eyes: Denies blurriness of vision Ears, nose, mouth, throat, and face: Denies mucositis or sore throat Respiratory: Denies cough, dyspnea or wheezes Cardiovascular: Denies palpitation, chest discomfort or lower extremity swelling Gastrointestinal:  Denies nausea, heartburn or change in bowel habits Skin: Denies abnormal skin rashes Lymphatics: Denies new lymphadenopathy or easy bruising Neurological:Denies numbness, tingling or new weaknesses Behavioral/Psych: Mood is stable, no new changes  All other systems were reviewed with the patient and are negative.  I have reviewed the past medical history, past surgical history, social history and family history with the patient and they are unchanged from previous note.  ALLERGIES:  is allergic to hydrocodone; ace inhibitors; and cephalexin.  MEDICATIONS:  Current Outpatient Prescriptions  Medication Sig Dispense  Refill  . acyclovir (ZOVIRAX) 400 MG tablet TAKE 1 TABLET BY MOUTH EVERY DAY 30 tablet 5  . amLODipine (NORVASC) 10 MG tablet TAKE 1 TABLET BY MOUTH EVERY DAY 90 tablet 2  . butalbital-acetaminophen-caffeine (FIORICET, ESGIC) 50-325-40 MG tablet TAKE 1 TABLET BY MOUTH EVERY 6 HOURS AS NEEDED FOR HEADACHE 90 tablet 0  .  cholecalciferol (VITAMIN D) 1000 UNITS tablet Take 2,000 Units by mouth every morning.     . [facility] SENNA PLUS 8.6-50 MG per tablet TAKE 2 TABLETS BY MOUTH TWICE A DAY 60 tablet 1  . dexamethasone (DECADRON) 4 MG tablet Take 1 tablet (4 mg total) by mouth daily. (Patient taking differently: Take 2 mg by mouth daily. ) 90 tablet 1  . famotidine (PEPCID) 20 MG tablet Take 20 mg by mouth daily.    Marland Kitchen HYDROmorphone (DILAUDID) 8 MG tablet Take 1 tablet (8 mg total) by mouth every 6 (six) hours as needed for severe pain. 90 tablet 0  . lidocaine-prilocaine (EMLA) cream Apply 1 application topically as needed. 30 g 6  . morphine (MS CONTIN) 15 MG 12 hr tablet Take 1 tablet (15 mg total) by mouth every 12 (twelve) hours. 60 tablet 0  . ondansetron (ZOFRAN) 4 MG tablet Take 4 mg by mouth every 8 (eight) hours as needed for nausea or vomiting.    . polyethylene glycol (MIRALAX / GLYCOLAX) packet Take 17 g by mouth daily.    . polyvinyl alcohol (LIQUIFILM TEARS) 1.4 % ophthalmic solution Place 1 drop into both eyes as needed for dry eyes.     Marland Kitchen ondansetron (ZOFRAN) 4 MG tablet Take 1 tablet (4 mg total) by mouth every 8 (eight) hours as needed for nausea. 90 tablet 3   No current facility-administered medications for this visit.     PHYSICAL EXAMINATION: ECOG PERFORMANCE STATUS: 1 - Symptomatic but completely ambulatory  Vitals:   08/29/16 1523  BP: 124/67  Pulse: 92  Resp: 18  Temp: 98.5 F (36.9 C)   Filed Weights   08/29/16 1523  Weight: 103 lb 12.8 oz (47.1 kg)    GENERAL:alert, no distress and comfortable SKIN: skin color, texture, turgor are normal, no rashes or significant lesions EYES: normal, Conjunctiva are pink and non-injected, sclera clear Musculoskeletal:no cyanosis of digits and no clubbing  NEURO: alert & oriented x 3 with fluent speech, no focal motor/sensory deficits  LABORATORY DATA:  I have reviewed the data as listed    Component Value Date/Time   NA 141 08/04/2016 1515    K 4.4 08/04/2016 1515   CL 105 09/24/2014 0722   CO2 28 08/04/2016 1515   GLUCOSE 90 08/04/2016 1515   BUN 23.2 08/04/2016 1515   CREATININE 1.0 08/04/2016 1515   CALCIUM 9.3 08/04/2016 1515   PROT 6.5 08/04/2016 1515   ALBUMIN 3.8 08/04/2016 1515   AST 19 08/04/2016 1515   ALT 28 08/04/2016 1515   ALKPHOS 63 08/04/2016 1515   BILITOT <0.30 08/04/2016 1515   GFRNONAA 47 (L) 09/24/2014 0722   GFRAA 54 (L) 09/24/2014 0722    No results found for: SPEP, UPEP  Lab Results  Component Value Date   WBC 4.0 08/04/2016   NEUTROABS 2.3 08/04/2016   HGB 12.0 08/04/2016   HCT 36.3 08/04/2016   MCV 97.7 08/04/2016   PLT 172 08/04/2016      Chemistry      Component Value Date/Time   NA 141 08/04/2016 1515   K 4.4 08/04/2016 1515   CL 105  09/24/2014 0722   CO2 28 08/04/2016 1515   BUN 23.2 08/04/2016 1515   CREATININE 1.0 08/04/2016 1515      Component Value Date/Time   CALCIUM 9.3 08/04/2016 1515   ALKPHOS 63 08/04/2016 1515   AST 19 08/04/2016 1515   ALT 28 08/04/2016 1515   BILITOT <0.30 08/04/2016 1515      ASSESSMENT & PLAN:  Multiple myeloma without remission The patient has made informed decision not to pursue any further systemic treatment recently due to poor quality of life. With low-dose dexamethasone daily, she is functioning very well. She has improved appetite, reduced bone pain and increased energy level. I recommend reducing the dose of dexamethasone to 2 mg every other day with plan to further taper in the future. She inquires about whether it would be appropriate to repeat myeloma panel now I do not recommend we recheck her blood work now. She has excellent quality of life without chemotherapy. Unless she is placed to restart her self back on treatment in the near future, I recommend no blood draw and to focus on supportive care visits only. She agreed with the plan of care.  Cancer associated pain She has excellent control of her pain with 4 mg  Dilaudid. I suspect the regular dosage of dexamethasone also has helped with her bone pain. I have started slow taper of dexamethasone. I refill her prescription of Dilaudid today and will reassess her bone pain again in her next visit  Cachexia ([place]) Her appetite has improved with regular dosage of dexamethasone. I recommend reducing the frequency of dexamethasone to every other day  Goals of care, counseling/discussion The patient is aware she has stage IV disease and treatment is strictly palliative. We discussed importance of Advanced Directives and Living will. We discussed CODE STATUS; the patient desires to DNR. We also discussed potential referral for home base palliative care but the patient declined for now. She is pleased with the plan that her quality of life has improved with conservative management without systemic treatment.    No orders of the defined types were placed in this encounter.  All questions were answered. The patient knows to call the clinic with any problems, questions or concerns. No barriers to learning was detected. I spent 20 minutes counseling the patient face to face. The total time spent in the appointment was 20 minutes and more than 50% was on counseling and review of test results     Heath Lark, MD 08/30/2016 1:42 PM

## 2016-08-30 NOTE — Assessment & Plan Note (Signed)
The patient has made informed decision not to pursue any further systemic treatment recently due to poor quality of life. With low-dose dexamethasone daily, she is functioning very well. She has improved appetite, reduced bone pain and increased energy level. I recommend reducing the dose of dexamethasone to 2 mg every other day with plan to further taper in the future. She inquires about whether it would be appropriate to repeat myeloma panel now I do not recommend we recheck her blood work now. She has excellent quality of life without chemotherapy. Unless she is placed to restart her self back on treatment in the near future, I recommend no blood draw and to focus on supportive care visits only. She agreed with the plan of care.

## 2016-08-30 NOTE — Assessment & Plan Note (Addendum)
The patient is aware she has stage IV disease and treatment is strictly palliative. We discussed importance of Advanced Directives and Living will. We discussed CODE STATUS; the patient desires to DNR. We also discussed potential referral for home base palliative care but the patient declined for now. She is pleased with the plan that her quality of life has improved with conservative management without systemic treatment.

## 2016-09-03 ENCOUNTER — Telehealth: Payer: Self-pay | Admitting: Hematology and Oncology

## 2016-09-03 NOTE — Telephone Encounter (Signed)
error 

## 2016-09-03 NOTE — Telephone Encounter (Signed)
Lvm advising appt for 10/19 @ 8.30am.

## 2016-09-04 ENCOUNTER — Telehealth: Payer: Self-pay | Admitting: Hematology and Oncology

## 2016-09-04 NOTE — Telephone Encounter (Signed)
10/19 appointment rescheduled to 10/26 per patient request. Pt. Called to reschedule per availability.

## 2016-09-21 ENCOUNTER — Ambulatory Visit: Payer: 59 | Admitting: Hematology and Oncology

## 2016-09-21 ENCOUNTER — Telehealth: Payer: Self-pay | Admitting: *Deleted

## 2016-09-21 NOTE — Telephone Encounter (Signed)
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   She feels well and has a lot of energy.  She wants to keep her appt next Thursday at 10 am as scheduled.  Says it is no problem.  She thanks Dr. Alvy Bimler for offering to see her tomorrow.

## 2016-09-21 NOTE — Telephone Encounter (Signed)
Called pt regarding her appt next week.  Dr. Alvy Bimler says she can see pt tomorrow afternoon at 3 pm if that works better for pt.(there is a cancellation at that time).  LVM for pt asking her to call nurse back to confirm.

## 2016-09-28 ENCOUNTER — Ambulatory Visit (HOSPITAL_BASED_OUTPATIENT_CLINIC_OR_DEPARTMENT_OTHER): Payer: 59 | Admitting: Hematology and Oncology

## 2016-09-28 ENCOUNTER — Encounter: Payer: Self-pay | Admitting: Hematology and Oncology

## 2016-09-28 ENCOUNTER — Telehealth: Payer: Self-pay | Admitting: Hematology and Oncology

## 2016-09-28 DIAGNOSIS — C9000 Multiple myeloma not having achieved remission: Secondary | ICD-10-CM

## 2016-09-28 DIAGNOSIS — Z7189 Other specified counseling: Secondary | ICD-10-CM | POA: Diagnosis not present

## 2016-09-28 DIAGNOSIS — G893 Neoplasm related pain (acute) (chronic): Secondary | ICD-10-CM

## 2016-09-28 DIAGNOSIS — F4541 Pain disorder exclusively related to psychological factors: Secondary | ICD-10-CM | POA: Diagnosis not present

## 2016-09-28 MED ORDER — LIDOCAINE-PRILOCAINE 2.5-2.5 % EX CREA: 1.0000 "application " | TOPICAL_CREAM | CUTANEOUS | 6 refills | Status: AC | PRN

## 2016-09-28 MED ORDER — BUTALBITAL-APAP-CAFFEINE 50-325-40 MG PO TABS: ORAL_TABLET | ORAL | 0 refills | Status: DC

## 2016-09-28 MED ORDER — HYDROMORPHONE HCL 8 MG PO TABS: 8.0000 mg | ORAL_TABLET | Freq: Four times a day (QID) | ORAL | 0 refills | Status: DC | PRN

## 2016-09-28 MED ORDER — DEXAMETHASONE 1 MG PO TABS: 1.0000 mg | ORAL_TABLET | Freq: Every day | ORAL | 1 refills | Status: DC

## 2016-09-28 NOTE — Assessment & Plan Note (Signed)
She has excellent control of her pain with 4 mg Dilaudid. I suspect the regular dosage of dexamethasone also has helped with her bone pain. I have started slow taper of dexamethasone. I refill her prescription of Dilaudid today and will reassess her bone pain again in her next visit

## 2016-09-28 NOTE — Assessment & Plan Note (Signed)
The patient has made informed decision not to pursue any further systemic treatment recently due to poor quality of life. With low-dose dexamethasone daily, she is functioning very well. She has improved appetite, reduced bone pain and increased energy level. I recommend reducing the dose of dexamethasone to 1 mg every other day with plan to further taper in the future. She inquires about whether it would be appropriate to repeat myeloma panel now I do not recommend we recheck her blood work now. She has excellent quality of life without chemotherapy. Unless she is placed to restart her self back on treatment in the near future, I recommend no blood draw and to focus on supportive care visits only. She agreed with the plan of care.

## 2016-09-28 NOTE — Progress Notes (Signed)
[place] OFFICE PROGRESS NOTE  Patient Care Team: Hulan Fess, MD as PCP - General (Family Medicine) Luanne Bras, MD as Consulting Physician (Interventional Radiology) Webb Laws, OD as Consulting Physician (Optometry)  SUMMARY OF ONCOLOGIC HISTORY: Oncology History   Multiple myeloma without remission; Calcium 13.3, Creatinine 2.83, Hg 8.3, Bone lesions present, Beta 2 microglobulin 12.8, Albumin 3.5, IgA 1760 and kappa light chains 1670. Bone marrow biopsy showed 82% involvement.   Primary site: Multiple Myeloma   Staging method: AJCC 6th Edition   Clinical: Stage IIIB signed by Heath Lark, MD on 07/13/2014 12:38 PM   Summary: Stage IIIB       Multiple myeloma without remission ([place])   07/07/2014 - 07/10/2014 Hospital Admission    She was admitted to the hospital for management of malignant hypercalcemia and had bone marrow biopsy which confirmed diagnosis of multiple myeloma. The patient received one unit of blood transfusion due to severe anemia.      07/08/2014 Bone Marrow Biopsy    The patient had bone marrow biopsy that confirmed diagnosis.      07/08/2014 Pathology Results    Accession: [id number] showed 82% involvement of bone marrow.      07/08/2014 - 10/19/2014 Chemotherapy    She was started on Velcade along with dexamethasone.      08/21/2014 - 10/19/2014 Chemotherapy    I added Revlimid and Zometa to her chemotherapy regimen.      09/24/2014 Pathology Results    Accession: [id number] biopsy showed plasma cells involvement.      09/24/2014 Procedure    she has kyphoplasty performed.      09/30/2014 Bone Marrow Biopsy    Accession: [id number] bone marrow biopsy showed residual plasma cell, approximately 11%      11/06/2014 - 05/07/2015 Chemotherapy    She was started on Velcade maintenance every other week, treatment is discontinued due to neuropathic pain.      05/14/2015 - 07/02/2015 Chemotherapy    The patient received Revlimid  and start taking it as maintenance therapy. Treatment was discontinued due to side-effects      12/10/2015 -  Chemotherapy    She resumed treatment due to disease relapse with Revlimid, dexamethasone and Ninlaro      01/07/2016 Adverse Reaction    Treatment was placed on hold due to fatigue and diarrhea. She subsequently resumed treatment at reduced dose       INTERVAL HISTORY: Please see below for problem oriented charting. She returns for further follow-up. Her bone pain is under good control. She has good appetite and is gaining weight. She had occasional stress headaches, well controlled with medication. No recent falls of fracture. She is comfortable with the current plan of care  REVIEW OF SYSTEMS:   Constitutional: Denies fevers, chills or abnormal weight loss Eyes: Denies blurriness of vision Ears, nose, mouth, throat, and face: Denies mucositis or sore throat Respiratory: Denies cough, dyspnea or wheezes Cardiovascular: Denies palpitation, chest discomfort or lower extremity swelling Gastrointestinal:  Denies nausea, heartburn or change in bowel habits Skin: Denies abnormal skin rashes Lymphatics: Denies new lymphadenopathy or easy bruising Neurological:Denies numbness, tingling or new weaknesses Behavioral/Psych: Mood is stable, no new changes  All other systems were reviewed with the patient and are negative.  I have reviewed the past medical history, past surgical history, social history and family history with the patient and they are unchanged from previous note.  ALLERGIES:  is allergic to hydrocodone; ace inhibitors; and cephalexin.  MEDICATIONS:  Current Outpatient Prescriptions  Medication Sig Dispense Refill  . acyclovir (ZOVIRAX) 400 MG tablet TAKE 1 TABLET BY MOUTH EVERY DAY 30 tablet 5  . amLODipine (NORVASC) 10 MG tablet TAKE 1 TABLET BY MOUTH EVERY DAY 90 tablet 2  . butalbital-acetaminophen-caffeine (FIORICET, ESGIC) 50-325-40 MG tablet TAKE 1 TABLET  BY MOUTH EVERY 6 HOURS AS NEEDED FOR HEADACHE 90 tablet 0  . cholecalciferol (VITAMIN D) 1000 UNITS tablet Take 2,000 Units by mouth every morning.     . [facility] SENNA PLUS 8.6-50 MG per tablet TAKE 2 TABLETS BY MOUTH TWICE A DAY 60 tablet 1  . dexamethasone (DECADRON) 4 MG tablet Take 1 tablet (4 mg total) by mouth daily. (Patient taking differently: Take 2 mg by mouth daily. ) 90 tablet 1  . famotidine (PEPCID) 20 MG tablet Take 20 mg by mouth daily.    Marland Kitchen HYDROmorphone (DILAUDID) 8 MG tablet Take 1 tablet (8 mg total) by mouth every 6 (six) hours as needed for severe pain. 90 tablet 0  . lidocaine-prilocaine (EMLA) cream Apply 1 application topically as needed. 30 g 6  . morphine (MS CONTIN) 15 MG 12 hr tablet Take 1 tablet (15 mg total) by mouth every 12 (twelve) hours. 60 tablet 0  . ondansetron (ZOFRAN) 4 MG tablet Take 4 mg by mouth every 8 (eight) hours as needed for nausea or vomiting.    . ondansetron (ZOFRAN) 4 MG tablet Take 1 tablet (4 mg total) by mouth every 8 (eight) hours as needed for nausea. 90 tablet 3  . polyethylene glycol (MIRALAX / GLYCOLAX) packet Take 17 g by mouth daily.    . polyvinyl alcohol (LIQUIFILM TEARS) 1.4 % ophthalmic solution Place 1 drop into both eyes as needed for dry eyes.     Marland Kitchen dexamethasone (DECADRON) 1 MG tablet Take 1 tablet (1 mg total) by mouth daily. 90 tablet 1  . lidocaine-prilocaine (EMLA) cream Apply 1 application topically as needed. 30 g 6   No current facility-administered medications for this visit.     PHYSICAL EXAMINATION: ECOG PERFORMANCE STATUS: 0 - Asymptomatic  Vitals:   09/28/16 0959  BP: 129/67  Pulse: 96  Resp: 18  Temp: 98.2 F (36.8 C)   Filed Weights   09/28/16 0959  Weight: 104 lb 12.8 oz (47.5 kg)    GENERAL:alert, no distress and comfortable SKIN: skin color, texture, turgor are normal, no rashes or significant lesions EYES: normal, Conjunctiva are pink and non-injected, sclera clear Musculoskeletal:no cyanosis of  digits and no clubbing  NEURO: alert & oriented x 3 with fluent speech, no focal motor/sensory deficits  LABORATORY DATA:  I have reviewed the data as listed    Component Value Date/Time   NA 141 08/04/2016 1515   K 4.4 08/04/2016 1515   CL 105 09/24/2014 0722   CO2 28 08/04/2016 1515   GLUCOSE 90 08/04/2016 1515   BUN 23.2 08/04/2016 1515   CREATININE 1.0 08/04/2016 1515   CALCIUM 9.3 08/04/2016 1515   PROT 6.5 08/04/2016 1515   ALBUMIN 3.8 08/04/2016 1515   AST 19 08/04/2016 1515   ALT 28 08/04/2016 1515   ALKPHOS 63 08/04/2016 1515   BILITOT <0.30 08/04/2016 1515   GFRNONAA 47 (L) 09/24/2014 0722   GFRAA 54 (L) 09/24/2014 0722    No results found for: SPEP, UPEP  Lab Results  Component Value Date   WBC 4.0 08/04/2016   NEUTROABS 2.3 08/04/2016   HGB 12.0 08/04/2016   HCT 36.3 08/04/2016   MCV  97.7 08/04/2016   PLT 172 08/04/2016      Chemistry      Component Value Date/Time   NA 141 08/04/2016 1515   K 4.4 08/04/2016 1515   CL 105 09/24/2014 0722   CO2 28 08/04/2016 1515   BUN 23.2 08/04/2016 1515   CREATININE 1.0 08/04/2016 1515      Component Value Date/Time   CALCIUM 9.3 08/04/2016 1515   ALKPHOS 63 08/04/2016 1515   AST 19 08/04/2016 1515   ALT 28 08/04/2016 1515   BILITOT <0.30 08/04/2016 1515      ASSESSMENT & PLAN:  Multiple myeloma without remission The patient has made informed decision not to pursue any further systemic treatment recently due to poor quality of life. With low-dose dexamethasone daily, she is functioning very well. She has improved appetite, reduced bone pain and increased energy level. I recommend reducing the dose of dexamethasone to 1 mg every other day with plan to further taper in the future. She inquires about whether it would be appropriate to repeat myeloma panel now I do not recommend we recheck her blood work now. She has excellent quality of life without chemotherapy. Unless she is placed to restart her self back  on treatment in the near future, I recommend no blood draw and to focus on supportive care visits only. She agreed with the plan of care.  Cancer associated pain She has excellent control of her pain with 4 mg Dilaudid. I suspect the regular dosage of dexamethasone also has helped with her bone pain. I have started slow taper of dexamethasone. I refill her prescription of Dilaudid today and will reassess her bone pain again in her next visit  Stress headaches Her headaches are stress related and well controlled with Fioricet I refilled her prescription today  Goals of care, counseling/discussion The patient is aware she has stage IV disease and treatment is strictly palliative. We discussed importance of Advanced Directives and Living will. We discussed CODE STATUS; the patient desires to DNR. We also discussed potential referral for home base palliative care but the patient declined for now. She is pleased with the plan that her quality of life has improved with conservative management without systemic treatment.    No orders of the defined types were placed in this encounter.  All questions were answered. The patient knows to call the clinic with any problems, questions or concerns. No barriers to learning was detected. I spent 15 minutes counseling the patient face to face. The total time spent in the appointment was 20 minutes and more than 50% was on counseling and review of test results     Heath Lark, MD 09/28/2016 2:51 PM

## 2016-09-28 NOTE — Assessment & Plan Note (Signed)
The patient is aware she has stage IV disease and treatment is strictly palliative. We discussed importance of Advanced Directives and Living will. We discussed CODE STATUS; the patient desires to DNR. We also discussed potential referral for home base palliative care but the patient declined for now. She is pleased with the plan that her quality of life has improved with conservative management without systemic treatment.

## 2016-09-28 NOTE — Telephone Encounter (Signed)
Appointments scheduled per 10/26 LOS. The patient was given AVS report and calendar with next Scheduled appointments.

## 2016-09-28 NOTE — Assessment & Plan Note (Signed)
Her headaches are stress related and well controlled with Fioricet I refilled her prescription today

## 2016-10-30 ENCOUNTER — Telehealth: Payer: Self-pay | Admitting: *Deleted

## 2016-10-30 DIAGNOSIS — C9000 Multiple myeloma not having achieved remission: Secondary | ICD-10-CM

## 2016-10-30 MED ORDER — HYDROMORPHONE HCL 8 MG PO TABS: 8.0000 mg | ORAL_TABLET | Freq: Four times a day (QID) | ORAL | 0 refills | Status: DC | PRN

## 2016-10-30 NOTE — Telephone Encounter (Signed)
Received call @ 120 in regards to pt. needing a refill on her DILAUDID 8mg  and her DEXAMETHASONE 1mg .

## 2016-10-30 NOTE — Telephone Encounter (Signed)
Pt left VM requesting refill on Dilaudid and Dexamethasone.  Rx left for Dr. Alvy Bimler to sign.  She also reports she had a Sore throat, nasal congestion and fever 99.7 while [place] the beach [place].  She went to Urgent Care and was given Rx for Amoxicillin 500 mg TID.  She says she is starting to feeling better now.

## 2016-10-31 ENCOUNTER — Other Ambulatory Visit: Payer: Self-pay | Admitting: *Deleted

## 2016-10-31 MED ORDER — DEXAMETHASONE 1 MG PO TABS: 1.0000 mg | ORAL_TABLET | Freq: Every day | ORAL | 3 refills | Status: DC

## 2016-10-31 NOTE — Telephone Encounter (Signed)
LM for pt informing of Rx for dilaudid ready to pick up at our office and Refill on Dexamethasone [facility] mg [facility] [address].. Asked her to call nurse back if any questions

## 2016-11-23 ENCOUNTER — Telehealth: Payer: Self-pay | Admitting: Hematology and Oncology

## 2016-11-23 NOTE — Telephone Encounter (Signed)
Patient called to have 1/29 appointment scheduled for 01/05/17.

## 2016-11-23 NOTE — Telephone Encounter (Signed)
Returned call to patient in regards to rescheduling her 1/29 appointments. The patient requested to have her appointments rescheduled to 1/26 or 1/24. Per no availability I left a message informing the patient and asked her to call again if she still needs to reschedule.

## 2016-11-30 ENCOUNTER — Telehealth: Payer: Self-pay | Admitting: *Deleted

## 2016-11-30 DIAGNOSIS — C9000 Multiple myeloma not having achieved remission: Secondary | ICD-10-CM

## 2016-11-30 MED ORDER — HYDROMORPHONE HCL 8 MG PO TABS
8.0000 mg | ORAL_TABLET | Freq: Four times a day (QID) | ORAL | 0 refills | Status: DC | PRN
Start: 2016-11-30 — End: 2017-01-01

## 2016-11-30 NOTE — Telephone Encounter (Signed)
Pt requested refill on Dilaudid.  Informed her of Rx ready to pick up.

## 2016-12-31 ENCOUNTER — Telehealth: Payer: Self-pay

## 2016-12-31 NOTE — Telephone Encounter (Signed)
Called and left a message with a new appt due to call day   Marissa Parks

## 2017-01-01 ENCOUNTER — Ambulatory Visit: Payer: 59 | Admitting: Hematology and Oncology

## 2017-01-01 ENCOUNTER — Other Ambulatory Visit: Payer: Self-pay | Admitting: *Deleted

## 2017-01-01 DIAGNOSIS — C9000 Multiple myeloma not having achieved remission: Secondary | ICD-10-CM

## 2017-01-01 MED ORDER — HYDROMORPHONE HCL 8 MG PO TABS: 8.0000 mg | ORAL_TABLET | Freq: Four times a day (QID) | ORAL | 0 refills | Status: DC | PRN

## 2017-01-05 ENCOUNTER — Ambulatory Visit: Payer: 59 | Admitting: Hematology and Oncology

## 2017-01-18 ENCOUNTER — Ambulatory Visit (HOSPITAL_BASED_OUTPATIENT_CLINIC_OR_DEPARTMENT_OTHER): Payer: 59

## 2017-01-18 ENCOUNTER — Telehealth: Payer: Self-pay

## 2017-01-18 ENCOUNTER — Telehealth: Payer: Self-pay | Admitting: Hematology and Oncology

## 2017-01-18 ENCOUNTER — Other Ambulatory Visit: Payer: Self-pay | Admitting: *Deleted

## 2017-01-18 ENCOUNTER — Ambulatory Visit (HOSPITAL_BASED_OUTPATIENT_CLINIC_OR_DEPARTMENT_OTHER): Payer: 59 | Admitting: Hematology and Oncology

## 2017-01-18 ENCOUNTER — Telehealth: Payer: Self-pay | Admitting: *Deleted

## 2017-01-18 VITALS — BP 141/71 | HR 84 | Temp 97.6°F | Resp 18 | Ht 60.0 in | Wt 113.8 lb

## 2017-01-18 DIAGNOSIS — C9000 Multiple myeloma not having achieved remission: Secondary | ICD-10-CM | POA: Diagnosis not present

## 2017-01-18 DIAGNOSIS — R300 Dysuria: Secondary | ICD-10-CM

## 2017-01-18 DIAGNOSIS — Z7189 Other specified counseling: Secondary | ICD-10-CM

## 2017-01-18 DIAGNOSIS — G893 Neoplasm related pain (acute) (chronic): Secondary | ICD-10-CM

## 2017-01-18 DIAGNOSIS — F4541 Pain disorder exclusively related to psychological factors: Secondary | ICD-10-CM

## 2017-01-18 LAB — URINALYSIS, MICROSCOPIC - CHCC
BILIRUBIN (URINE): NEGATIVE
BLOOD: NEGATIVE
Glucose: NEGATIVE mg/dL
KETONES: NEGATIVE mg/dL
Leukocyte Esterase: NEGATIVE
Nitrite: NEGATIVE
Protein: 30 mg/dL
SPECIFIC GRAVITY, URINE: 1.02 (ref 1.003–1.035)
Urobilinogen, UR: 0.2 mg/dL (ref 0.2–1)
WBC, UA: NEGATIVE (ref 0–?)
pH: 6.5 (ref 4.6–8.0)

## 2017-01-18 LAB — COMPREHENSIVE METABOLIC PANEL
ALBUMIN: 3.9 g/dL (ref 3.5–5.0)
ALK PHOS: 77 U/L (ref 40–150)
ALT: 32 U/L (ref 0–55)
ANION GAP: 11 meq/L (ref 3–11)
AST: 19 U/L (ref 5–34)
BUN: 19 mg/dL (ref 7.0–26.0)
CALCIUM: 10 mg/dL (ref 8.4–10.4)
CO2: 28 mEq/L (ref 22–29)
Chloride: 105 mEq/L (ref 98–109)
Creatinine: 1 mg/dL (ref 0.6–1.1)
EGFR: 60 mL/min/{1.73_m2} — ABNORMAL LOW (ref >90)
Glucose: 90 mg/dl (ref 70–140)
POTASSIUM: 3.6 meq/L (ref 3.5–5.1)
Sodium: 143 mEq/L (ref 136–145)
Total Bilirubin: <0.22 mg/dL (ref 0.20–1.20)
Total Protein: 7.1 g/dL (ref 6.4–8.3)

## 2017-01-18 LAB — CBC WITH DIFFERENTIAL/PLATELET
BASO%: 0.3 % (ref 0.0–2.0)
Basophils Absolute: 0 10*3/uL (ref 0.0–0.1)
EOS ABS: 0.1 10*3/uL (ref 0.0–0.5)
EOS%: 1 % (ref 0.0–7.0)
HEMATOCRIT: 37.1 % (ref 34.8–46.6)
HEMOGLOBIN: 12.2 g/dL (ref 11.6–15.9)
LYMPH#: 1.5 10*3/uL (ref 0.9–3.3)
LYMPH%: 26.8 % (ref 14.0–49.7)
MCH: 32.9 pg (ref 25.1–34.0)
MCHC: 32.9 g/dL (ref 31.5–36.0)
MCV: 100 fL (ref 79.5–101.0)
MONO#: 0.5 10*3/uL (ref 0.1–0.9)
MONO%: 7.8 % (ref 0.0–14.0)
NEUT#: 3.7 10*3/uL (ref 1.5–6.5)
NEUT%: 64.1 % (ref 38.4–76.8)
PLATELETS: 188 10*3/uL (ref 145–400)
RBC: 3.71 10*6/uL (ref 3.70–5.45)
RDW: 13.1 % (ref 11.2–14.5)
WBC: 5.7 10*3/uL (ref 3.9–10.3)

## 2017-01-18 MED ORDER — BUTALBITAL-APAP-CAFFEINE 50-325-40 MG PO TABS: ORAL_TABLET | ORAL | 0 refills | Status: AC

## 2017-01-18 MED ORDER — HYDROMORPHONE HCL 8 MG PO TABS: 8.0000 mg | ORAL_TABLET | Freq: Four times a day (QID) | ORAL | 0 refills | Status: DC | PRN

## 2017-01-18 MED ORDER — AMLODIPINE BESYLATE 10 MG PO TABS: 10.0000 mg | ORAL_TABLET | Freq: Every day | ORAL | 11 refills | Status: AC

## 2017-01-18 NOTE — Telephone Encounter (Signed)
Left message regarding below message.

## 2017-01-18 NOTE — Telephone Encounter (Signed)
"  [facility] [address] calling to follow up on refill request for amlodipine which was faxed to your office."  Will notify provider of this request.  Scheduled for F/U today at 2:45 pm.

## 2017-01-18 NOTE — Telephone Encounter (Signed)
-----   Message from Heath Lark, MD sent at 01/18/2017  4:08 PM EST ----- Regarding: labs Pls tell her all labs are back and normal!! ----- Message ----- From: Interface, Lab In Three Zero One Sent: 01/18/2017   3:37 PM To: Heath Lark, MD

## 2017-01-18 NOTE — Telephone Encounter (Signed)
Appointments scheduled per 2/15 LOS. Patient given AVS report and calendars with future scheduled appointments. °

## 2017-01-19 ENCOUNTER — Encounter: Payer: Self-pay | Admitting: Hematology and Oncology

## 2017-01-19 LAB — URINE CULTURE

## 2017-01-19 NOTE — Progress Notes (Signed)
[facility] OFFICE PROGRESS NOTE  Patient Care Team: Catha Gosselin, MD as PCP - General (Family Medicine) Julieanne Cotton, MD as Consulting Physician (Interventional Radiology) Glenford Peers, OD as Consulting Physician (Optometry)  SUMMARY OF ONCOLOGIC HISTORY: Oncology History   Multiple myeloma without remission; Calcium 13.3, Creatinine 2.83, Hg 8.3, Bone lesions present, Beta 2 microglobulin 12.8, Albumin 3.5, IgA 1760 and kappa light chains 1670. Bone marrow biopsy showed 82% involvement.   Primary site: Multiple Myeloma   Staging method: AJCC 6th Edition   Clinical: Stage IIIB signed by Artis Delay, MD on 07/13/2014 12:38 PM   Summary: Stage IIIB       Multiple myeloma without remission ([facility])   07/07/2014 - 07/10/2014 Hospital Admission    She was admitted to the hospital for management of malignant hypercalcemia and had bone marrow biopsy which confirmed diagnosis of multiple myeloma. The patient received one unit of blood transfusion due to severe anemia.      07/08/2014 Bone Marrow Biopsy    The patient had bone marrow biopsy that confirmed diagnosis.      07/08/2014 Pathology Results    Accession: [id number] showed 82% involvement of bone marrow.      07/08/2014 - 10/19/2014 Chemotherapy    She was started on Velcade along with dexamethasone.      08/21/2014 - 10/19/2014 Chemotherapy    I added Revlimid and Zometa to her chemotherapy regimen.      09/24/2014 Pathology Results    Accession: [id number] biopsy showed plasma cells involvement.      09/24/2014 Procedure    she has kyphoplasty performed.      09/30/2014 Bone Marrow Biopsy    Accession: [id number] bone marrow biopsy showed residual plasma cell, approximately 11%      11/06/2014 - 05/07/2015 Chemotherapy    She was started on Velcade maintenance every other week, treatment is discontinued due to neuropathic pain.      05/14/2015 - 07/02/2015 Chemotherapy    The patient received Revlimid  and start taking it as maintenance therapy. Treatment was discontinued due to side-effects      12/10/2015 -  Chemotherapy    She resumed treatment due to disease relapse with Revlimid, dexamethasone and Ninlaro      01/07/2016 Adverse Reaction    Treatment was placed on hold due to fatigue and diarrhea. She subsequently resumed treatment at reduced dose       INTERVAL HISTORY: Please see below for problem oriented charting. She is seen for further review of supportive care She continues to complain of chronic bone pain and headaches, well controlled with intermittent doses of pain medications, Fioricet and dexamethasone She has gained weight She has reasonable quality of life, no recent infections She plans to travel in the near futture  REVIEW OF SYSTEMS:   Constitutional: Denies fevers, chills or abnormal weight loss Eyes: Denies blurriness of vision Ears, nose, mouth, throat, and face: Denies mucositis or sore throat Respiratory: Denies cough, dyspnea or wheezes Cardiovascular: Denies palpitation, chest discomfort or lower extremity swelling Gastrointestinal:  Denies nausea, heartburn or change in bowel habits Skin: Denies abnormal skin rashes Lymphatics: Denies new lymphadenopathy or easy bruising Neurological:Denies numbness, tingling or new weaknesses Behavioral/Psych: Mood is stable, no new changes  All other systems were reviewed with the patient and are negative.  I have reviewed the past medical history, past surgical history, social history and family history with the patient and they are unchanged from previous note.  ALLERGIES:  is allergic to  hydrocodone; ace inhibitors; and cephalexin.  MEDICATIONS:  Current Outpatient Prescriptions  Medication Sig Dispense Refill  . acetaminophen (TYLENOL) 500 MG tablet Take 1,000 mg by mouth every 6 (six) hours as needed for mild pain.    Marland Kitchen acyclovir (ZOVIRAX) 400 MG tablet TAKE 1 TABLET BY MOUTH EVERY DAY 30 tablet 5  .  butalbital-acetaminophen-caffeine (FIORICET, ESGIC) 50-325-40 MG tablet TAKE 1 TABLET BY MOUTH EVERY 6 HOURS AS NEEDED FOR HEADACHE 90 tablet 0  . cholecalciferol (VITAMIN D) 1000 UNITS tablet Take 2,000 Units by mouth every morning.     . [facility] SENNA PLUS 8.6-50 MG per tablet TAKE 2 TABLETS BY MOUTH TWICE A DAY 60 tablet 1  . dexamethasone (DECADRON) 1 MG tablet Take 1 tablet (1 mg total) by mouth daily. 60 tablet 3  . famotidine (PEPCID) 20 MG tablet Take 20 mg by mouth daily.    Marland Kitchen HYDROmorphone (DILAUDID) 8 MG tablet Take 1 tablet (8 mg total) by mouth every 6 (six) hours as needed for severe pain. 90 tablet 0  . lidocaine-prilocaine (EMLA) cream Apply 1 application topically as needed. 30 g 6  . morphine (MS CONTIN) 15 MG 12 hr tablet Take 1 tablet (15 mg total) by mouth every 12 (twelve) hours. 60 tablet 0  . ondansetron (ZOFRAN) 4 MG tablet Take 1 tablet (4 mg total) by mouth every 8 (eight) hours as needed for nausea. 90 tablet 3  . polyethylene glycol (MIRALAX / GLYCOLAX) packet Take 17 g by mouth daily.    . polyvinyl alcohol (LIQUIFILM TEARS) 1.4 % ophthalmic solution Place 1 drop into both eyes as needed for dry eyes.     Marland Kitchen amLODipine (NORVASC) 10 MG tablet Take 1 tablet (10 mg total) by mouth daily. 90 tablet 11   No current facility-administered medications for this visit.     PHYSICAL EXAMINATION: ECOG PERFORMANCE STATUS: 1 - Symptomatic but completely ambulatory  Vitals:   01/18/17 1432  BP: (!) 141/71  Pulse: 84  Resp: 18  Temp: 97.6 F (36.4 C)   Filed Weights   01/18/17 1432  Weight: 113 lb 12.8 oz (51.6 kg)    GENERAL:alert, no distress and comfortable SKIN: skin color, texture, turgor are normal, no rashes or significant lesions EYES: normal, Conjunctiva are pink and non-injected, sclera clear Musculoskeletal:no cyanosis of digits and no clubbing  NEURO: alert & oriented x 3 with fluent speech, no focal motor/sensory deficits  LABORATORY DATA:  I have reviewed  the data as listed    Component Value Date/Time   NA 143 01/18/2017 1530   K 3.6 01/18/2017 1530   CL 105 09/24/2014 0722   CO2 28 01/18/2017 1530   GLUCOSE 90 01/18/2017 1530   BUN 19.0 01/18/2017 1530   CREATININE 1.0 01/18/2017 1530   CALCIUM 10.0 01/18/2017 1530   PROT 7.1 01/18/2017 1530   ALBUMIN 3.9 01/18/2017 1530   AST 19 01/18/2017 1530   ALT 32 01/18/2017 1530   ALKPHOS 77 01/18/2017 1530   BILITOT <0.22 01/18/2017 1530   GFRNONAA 47 (L) 09/24/2014 0722   GFRAA 54 (L) 09/24/2014 0722    No results found for: SPEP, UPEP  Lab Results  Component Value Date   WBC 5.7 01/18/2017   NEUTROABS 3.7 01/18/2017   HGB 12.2 01/18/2017   HCT 37.1 01/18/2017   MCV 100.0 01/18/2017   PLT 188 01/18/2017      Chemistry      Component Value Date/Time   NA 143 01/18/2017 1530   K  3.6 01/18/2017 1530   CL 105 09/24/2014 0722   CO2 28 01/18/2017 1530   BUN 19.0 01/18/2017 1530   CREATININE 1.0 01/18/2017 1530      Component Value Date/Time   CALCIUM 10.0 01/18/2017 1530   ALKPHOS 77 01/18/2017 1530   AST 19 01/18/2017 1530   ALT 32 01/18/2017 1530   BILITOT <0.22 01/18/2017 1530      ASSESSMENT & PLAN:  Multiple myeloma without remission The patient has made informed decision not to pursue any further systemic treatment recently due to poor quality of life. With low-dose dexamethasone daily, she is functioning very well. She has improved appetite, reduced bone pain and increased energy level. She is current taking low dose of dexamethasone or 1 mg every day on average, titration based on symptoms. I do not recommend we recheck her myeloma panel. She has excellent quality of life without chemotherapy. I shall continue to focus on supportive care visits only. She agreed with the plan of care.  Stress headaches Her headaches are stress related and well controlled with Fioricet I refilled her prescription today  Cancer associated pain She has excellent control of  her pain with 4-8 mg Dilaudid. I suspect the regular dosage of dexamethasone also has helped with her bone pain. I refilled her prescription of Dilaudid today and will reassess her bone pain again in her next visit  Goals of care, counseling/discussion The patient is aware she has stage IV disease and treatment is strictly palliative. We discussed importance of Advanced Directives and Living will. We discussed CODE STATUS; the patient desires to DNR. She is pleased with the plan that her quality of life has improved with conservative management without systemic treatment. She desired to travel in the near future I plan to see her back in 2 months for further supportive care    Orders Placed This Encounter  Procedures  . Urine culture    Standing Status:   Future    Number of Occurrences:   1    Standing Expiration Date:   02/22/2018  . Urinalysis, Microscopic - [facility]    Standing Status:   Future    Number of Occurrences:   1    Standing Expiration Date:   02/22/2018   All questions were answered. The patient knows to call the clinic with any problems, questions or concerns. No barriers to learning was detected. I spent 15 minutes counseling the patient face to face. The total time spent in the appointment was 20 minutes and more than 50% was on counseling and review of test results     Heath Lark, MD 01/19/2017 1:01 AM

## 2017-01-19 NOTE — Assessment & Plan Note (Signed)
She has excellent control of her pain with 4-8 mg Dilaudid. I suspect the regular dosage of dexamethasone also has helped with her bone pain. I refilled her prescription of Dilaudid today and will reassess her bone pain again in her next visit

## 2017-01-19 NOTE — Assessment & Plan Note (Signed)
The patient is aware she has stage IV disease and treatment is strictly palliative. We discussed importance of Advanced Directives and Living will. We discussed CODE STATUS; the patient desires to DNR. She is pleased with the plan that her quality of life has improved with conservative management without systemic treatment. She desired to travel in the near future I plan to see her back in 2 months for further supportive care

## 2017-01-19 NOTE — Assessment & Plan Note (Signed)
The patient has made informed decision not to pursue any further systemic treatment recently due to poor quality of life. With low-dose dexamethasone daily, she is functioning very well. She has improved appetite, reduced bone pain and increased energy level. She is current taking low dose of dexamethasone or 1 mg every day on average, titration based on symptoms. I do not recommend we recheck her myeloma panel. She has excellent quality of life without chemotherapy. I shall continue to focus on supportive care visits only. She agreed with the plan of care.

## 2017-01-19 NOTE — Assessment & Plan Note (Signed)
Her headaches are stress related and well controlled with Fioricet I refilled her prescription today

## 2017-03-01 ENCOUNTER — Telehealth: Payer: Self-pay

## 2017-03-01 NOTE — Telephone Encounter (Signed)
Called patient back and instructed per Dr. Alvy Bimler to take 1 mg of Dexamethasone in the morning and .5 mg ( 1/2) tablet in the evening. Patient declined Rx for the .5 mg Dexamethasone, states she has plenty and will split in half.

## 2017-03-01 NOTE — Telephone Encounter (Signed)
Patient called and left message that she is having night sweats that are brutal. She goes from freezing to burning up. She has deceased energy level and is still trying to work. She is wanting suggestion for night sweats.

## 2017-03-01 NOTE — Telephone Encounter (Signed)
It could be due to disease relapse or withdrawal from dexamethasone We can call in low dose dexamethasone so she takes at least 1 daily Can you verify what dose of dexa she is taking now?

## 2017-03-01 NOTE — Telephone Encounter (Signed)
Called patient back she states that she is taking 1 mg daily.

## 2017-03-09 ENCOUNTER — Telehealth: Payer: Self-pay | Admitting: *Deleted

## 2017-03-09 ENCOUNTER — Encounter: Payer: Self-pay | Admitting: Hematology and Oncology

## 2017-03-09 ENCOUNTER — Ambulatory Visit (HOSPITAL_BASED_OUTPATIENT_CLINIC_OR_DEPARTMENT_OTHER): Payer: 59

## 2017-03-09 ENCOUNTER — Ambulatory Visit (HOSPITAL_BASED_OUTPATIENT_CLINIC_OR_DEPARTMENT_OTHER): Payer: 59 | Admitting: Hematology and Oncology

## 2017-03-09 ENCOUNTER — Other Ambulatory Visit: Payer: Self-pay | Admitting: Hematology and Oncology

## 2017-03-09 ENCOUNTER — Telehealth: Payer: Self-pay

## 2017-03-09 VITALS — BP 135/68 | HR 99

## 2017-03-09 DIAGNOSIS — C9000 Multiple myeloma not having achieved remission: Secondary | ICD-10-CM

## 2017-03-09 DIAGNOSIS — G620 Drug-induced polyneuropathy: Secondary | ICD-10-CM

## 2017-03-09 DIAGNOSIS — G893 Neoplasm related pain (acute) (chronic): Secondary | ICD-10-CM | POA: Diagnosis not present

## 2017-03-09 DIAGNOSIS — G4709 Other insomnia: Secondary | ICD-10-CM | POA: Diagnosis not present

## 2017-03-09 DIAGNOSIS — N179 Acute kidney failure, unspecified: Secondary | ICD-10-CM

## 2017-03-09 DIAGNOSIS — E860 Dehydration: Secondary | ICD-10-CM | POA: Diagnosis not present

## 2017-03-09 DIAGNOSIS — Z7189 Other specified counseling: Secondary | ICD-10-CM

## 2017-03-09 DIAGNOSIS — F19982 Other psychoactive substance use, unspecified with psychoactive substance-induced sleep disorder: Secondary | ICD-10-CM | POA: Insufficient documentation

## 2017-03-09 DIAGNOSIS — T451X5A Adverse effect of antineoplastic and immunosuppressive drugs, initial encounter: Secondary | ICD-10-CM

## 2017-03-09 LAB — CBC WITH DIFFERENTIAL/PLATELET
BASO%: 0.3 % (ref 0.0–2.0)
Basophils Absolute: 0 10*3/uL (ref 0.0–0.1)
EOS%: 0.3 % (ref 0.0–7.0)
Eosinophils Absolute: 0 10*3/uL (ref 0.0–0.5)
HEMATOCRIT: 36 % (ref 34.8–46.6)
HEMOGLOBIN: 12.3 g/dL (ref 11.6–15.9)
LYMPH#: 1.4 10*3/uL (ref 0.9–3.3)
LYMPH%: 21.3 % (ref 14.0–49.7)
MCH: 33.2 pg (ref 25.1–34.0)
MCHC: 34.2 g/dL (ref 31.5–36.0)
MCV: 97.3 fL (ref 79.5–101.0)
MONO#: 0.5 10*3/uL (ref 0.1–0.9)
MONO%: 8.1 % (ref 0.0–14.0)
NEUT%: 70 % (ref 38.4–76.8)
NEUTROS ABS: 4.5 10*3/uL (ref 1.5–6.5)
Platelets: 174 10*3/uL (ref 145–400)
RBC: 3.7 10*6/uL (ref 3.70–5.45)
RDW: 13.3 % (ref 11.2–14.5)
WBC: 6.4 10*3/uL (ref 3.9–10.3)
nRBC: 0 % (ref 0–0)

## 2017-03-09 LAB — COMPREHENSIVE METABOLIC PANEL
ALT: 24 U/L (ref 0–55)
ANION GAP: 12 meq/L — AB (ref 3–11)
AST: 15 U/L (ref 5–34)
Albumin: 4.2 g/dL (ref 3.5–5.0)
Alkaline Phosphatase: 60 U/L (ref 40–150)
BILIRUBIN TOTAL: 0.31 mg/dL (ref 0.20–1.20)
BUN: 23.1 mg/dL (ref 7.0–26.0)
CHLORIDE: 103 meq/L (ref 98–109)
CO2: 26 meq/L (ref 22–29)
Calcium: 11.3 mg/dL — ABNORMAL HIGH (ref 8.4–10.4)
Creatinine: 1.4 mg/dL — ABNORMAL HIGH (ref 0.6–1.1)
EGFR: 38 mL/min/{1.73_m2} — AB (ref >90)
Glucose: 105 mg/dl (ref 70–140)
Potassium: 3.7 mEq/L (ref 3.5–5.1)
SODIUM: 142 meq/L (ref 136–145)
TOTAL PROTEIN: 8.1 g/dL (ref 6.4–8.3)

## 2017-03-09 MED ORDER — GABAPENTIN 300 MG PO CAPS: 300.0000 mg | ORAL_CAPSULE | Freq: Two times a day (BID) | ORAL | 9 refills | Status: AC

## 2017-03-09 MED ORDER — HYDROMORPHONE HCL 8 MG PO TABS: 8.0000 mg | ORAL_TABLET | Freq: Four times a day (QID) | ORAL | 0 refills | Status: DC | PRN

## 2017-03-09 MED ORDER — SODIUM CHLORIDE 0.9 % IV SOLN
INTRAVENOUS | Status: DC
  Administered 2017-03-09: 15:00:00 via INTRAVENOUS

## 2017-03-09 NOTE — Telephone Encounter (Signed)
Referral called to [facility] 

## 2017-03-09 NOTE — Assessment & Plan Note (Signed)
She has neuropathic pain, could be due to disease progression or exacerbation due to poor pain management I recommend a trial of gabapentin I warned her about risk of sedation and constipation

## 2017-03-09 NOTE — Assessment & Plan Note (Signed)
She has worsening cancer pain control due to disease progression I recommend increasing the dose of dexamethasone to 4 mg daily.  I will reassess pain control next week I also refill her prescription of Dilaudid to take as needed

## 2017-03-09 NOTE — Telephone Encounter (Signed)
Would the patient come in today to be seen? I can see her over lunch hour

## 2017-03-09 NOTE — Assessment & Plan Note (Signed)
This is due to disease relapse and dehydration I recommend increased dose steroids and IV fluid today and she agreed

## 2017-03-09 NOTE — Assessment & Plan Note (Signed)
She has profound signs and symptoms of disease relapse with profuse night sweats, increased pain and hypercalcemia We discussed goals of care The patient is comfortable with supportive care only. We discussed palliative care/hospice consult and she agreed to proceed We will consult [facility] I will see her next week for further evaluation of pain management

## 2017-03-09 NOTE — Assessment & Plan Note (Signed)
She has recent insomnia due to steroids I recommend she takes higher dose steroids to control her night sweats and pain and we will start her on gabapentin for neuropathic pain that will probably have sedate if effect could balance out her symptoms of insomnia I will reassess next week

## 2017-03-09 NOTE — Assessment & Plan Note (Signed)
This is due to disease relapse, malignant hypercalcemia and dehydration I recommend IV fluids today and she agreed to proceed

## 2017-03-09 NOTE — Progress Notes (Signed)
[place] OFFICE PROGRESS NOTE  Patient Care Team: Hulan Fess, MD as PCP - General (Family Medicine) Luanne Bras, MD as Consulting Physician (Interventional Radiology) Webb Laws, OD as Consulting Physician (Optometry)  SUMMARY OF ONCOLOGIC HISTORY: Oncology History   Multiple myeloma without remission; Calcium 13.3, Creatinine 2.83, Hg 8.3, Bone lesions present, Beta 2 microglobulin 12.8, Albumin 3.5, IgA 1760 and kappa light chains 1670. Bone marrow biopsy showed 82% involvement.   Primary site: Multiple Myeloma   Staging method: AJCC 6th Edition   Clinical: Stage IIIB signed by Heath Lark, MD on 07/13/2014 12:38 PM   Summary: Stage IIIB       Multiple myeloma without remission ([place])   07/07/2014 - 07/10/2014 Hospital Admission    She was admitted to the hospital for management of malignant hypercalcemia and had bone marrow biopsy which confirmed diagnosis of multiple myeloma. The patient received one unit of blood transfusion due to severe anemia.      07/08/2014 Bone Marrow Biopsy    The patient had bone marrow biopsy that confirmed diagnosis.      07/08/2014 Pathology Results    Accession: [id number] showed 82% involvement of bone marrow.      07/08/2014 - 10/19/2014 Chemotherapy    She was started on Velcade along with dexamethasone.      08/21/2014 - 10/19/2014 Chemotherapy    I added Revlimid and Zometa to her chemotherapy regimen.      09/24/2014 Pathology Results    Accession: [id number] biopsy showed plasma cells involvement.      09/24/2014 Procedure    she has kyphoplasty performed.      09/30/2014 Bone Marrow Biopsy    Accession: [id number] bone marrow biopsy showed residual plasma cell, approximately 11%      11/06/2014 - 05/07/2015 Chemotherapy    She was started on Velcade maintenance every other week, treatment is discontinued due to neuropathic pain.      05/14/2015 - 07/02/2015 Chemotherapy    The patient received Revlimid  and start taking it as maintenance therapy. Treatment was discontinued due to side-effects      12/10/2015 -  Chemotherapy    She resumed treatment due to disease relapse with Revlimid, dexamethasone and Ninlaro      01/07/2016 Adverse Reaction    Treatment was placed on hold due to fatigue and diarrhea. She subsequently resumed treatment at reduced dose       INTERVAL HISTORY: Please see below for problem oriented charting. She is seen urgently due to rapid decline in overall from a status She had profuse, debilitating neck sweats, bone pain and neuropathic pain. Her appetite is poor Her mental focus is reduced and she had poor energy level She is reducing work  REVIEW OF SYSTEMS:   Constitutional: Denies fevers, chills or abnormal weight loss Eyes: Denies blurriness of vision Ears, nose, mouth, throat, and face: Denies mucositis or sore throat Respiratory: Denies cough, dyspnea or wheezes Cardiovascular: Denies palpitation, chest discomfort or lower extremity swelling Gastrointestinal:  Denies nausea, heartburn or change in bowel habits Skin: Denies abnormal skin rashes Lymphatics: Denies new lymphadenopathy or easy bruising Behavioral/Psych: Mood is stable, no new changes  All other systems were reviewed with the patient and are negative.  I have reviewed the past medical history, past surgical history, social history and family history with the patient and they are unchanged from previous note.  ALLERGIES:  is allergic to hydrocodone; ace inhibitors; and cephalexin.  MEDICATIONS:  Current Outpatient Prescriptions  Medication Sig  Dispense Refill  . acetaminophen (TYLENOL) 500 MG tablet Take 1,000 mg by mouth every 6 (six) hours as needed for mild pain.    Marland Kitchen acyclovir (ZOVIRAX) 400 MG tablet TAKE 1 TABLET BY MOUTH EVERY DAY 30 tablet 5  . amLODipine (NORVASC) 10 MG tablet Take 1 tablet (10 mg total) by mouth daily. 90 tablet 11  . butalbital-acetaminophen-caffeine (FIORICET,  ESGIC) 50-325-40 MG tablet TAKE 1 TABLET BY MOUTH EVERY 6 HOURS AS NEEDED FOR HEADACHE 90 tablet 0  . cholecalciferol (VITAMIN D) 1000 UNITS tablet Take 2,000 Units by mouth every morning.     . [employer] SENNA PLUS 8.6-50 MG per tablet TAKE 2 TABLETS BY MOUTH TWICE A DAY 60 tablet 1  . dexamethasone (DECADRON) 1 MG tablet Take 1 tablet (1 mg total) by mouth daily. 60 tablet 3  . famotidine (PEPCID) 20 MG tablet Take 20 mg by mouth daily.    Marland Kitchen gabapentin (NEURONTIN) 300 MG capsule Take 1 capsule (300 mg total) by mouth 2 (two) times daily. 60 capsule 9  . HYDROmorphone (DILAUDID) 8 MG tablet Take 1 tablet (8 mg total) by mouth every 6 (six) hours as needed for severe pain. 90 tablet 0  . lidocaine-prilocaine (EMLA) cream Apply 1 application topically as needed. 30 g 6  . morphine (MS CONTIN) 15 MG 12 hr tablet Take 1 tablet (15 mg total) by mouth every 12 (twelve) hours. 60 tablet 0  . ondansetron (ZOFRAN) 4 MG tablet Take 1 tablet (4 mg total) by mouth every 8 (eight) hours as needed for nausea. 90 tablet 3  . polyethylene glycol (MIRALAX / GLYCOLAX) packet Take 17 g by mouth daily.    . polyvinyl alcohol (LIQUIFILM TEARS) 1.4 % ophthalmic solution Place 1 drop into both eyes as needed for dry eyes.      No current facility-administered medications for this visit.    Facility-Administered Medications Ordered in Other Visits  Medication Dose Route Frequency Provider Last Rate Last Dose  . 0.9 %  sodium chloride infusion   Intravenous Continuous Heath Lark, MD   Stopped at 03/09/17 1557    PHYSICAL EXAMINATION: ECOG PERFORMANCE STATUS: 2 - Symptomatic, <50% confined to bed  Vitals:   03/09/17 1357  BP: 130/75  Pulse: 98  Resp: 18  Temp: 97.9 F (36.6 C)   Filed Weights   03/09/17 1357  Weight: 105 lb 3.2 oz (47.7 kg)    GENERAL:alert, no distress and comfortable.  She looks thin and cachectic SKIN: skin color, texture, turgor are normal, no rashes or significant lesions EYES: normal,  Conjunctiva are pink and non-injected, sclera clear Musculoskeletal:no cyanosis of digits and no clubbing  NEURO: alert & oriented x 3 with fluent speech, no focal motor/sensory deficits  LABORATORY DATA:  I have reviewed the data as listed    Component Value Date/Time   NA 142 03/09/2017 1347   K 3.7 03/09/2017 1347   CL 105 09/24/2014 0722   CO2 26 03/09/2017 1347   GLUCOSE 105 03/09/2017 1347   BUN 23.1 03/09/2017 1347   CREATININE 1.4 (H) 03/09/2017 1347   CALCIUM 11.3 (H) 03/09/2017 1347   PROT 8.1 03/09/2017 1347   ALBUMIN 4.2 03/09/2017 1347   AST 15 03/09/2017 1347   ALT 24 03/09/2017 1347   ALKPHOS 60 03/09/2017 1347   BILITOT 0.31 03/09/2017 1347   GFRNONAA 47 (L) 09/24/2014 0722   GFRAA 54 (L) 09/24/2014 0722    No results found for: SPEP, UPEP  Lab Results  Component  Value Date   WBC 6.4 03/09/2017   NEUTROABS 4.5 03/09/2017   HGB 12.3 03/09/2017   HCT 36.0 03/09/2017   MCV 97.3 03/09/2017   PLT 174 03/09/2017      Chemistry      Component Value Date/Time   NA 142 03/09/2017 1347   K 3.7 03/09/2017 1347   CL 105 09/24/2014 0722   CO2 26 03/09/2017 1347   BUN 23.1 03/09/2017 1347   CREATININE 1.4 (H) 03/09/2017 1347      Component Value Date/Time   CALCIUM 11.3 (H) 03/09/2017 1347   ALKPHOS 60 03/09/2017 1347   AST 15 03/09/2017 1347   ALT 24 03/09/2017 1347   BILITOT 0.31 03/09/2017 1347      ASSESSMENT & PLAN:  Multiple myeloma without remission She has profound signs and symptoms of disease relapse with profuse night sweats, increased pain and hypercalcemia We discussed goals of care The patient is comfortable with supportive care only. We discussed palliative care/hospice consult and she agreed to proceed We will consult [facility] I will see her next week for further evaluation of pain management  Cancer associated pain She has worsening cancer pain control due to disease progression I recommend increasing the dose of  dexamethasone to 4 mg daily.  I will reassess pain control next week I also refill her prescription of Dilaudid to take as needed  Neuropathy due to chemotherapeutic drug She has neuropathic pain, could be due to disease progression or exacerbation due to poor pain management I recommend a trial of gabapentin I warned her about risk of sedation and constipation  Insomnia due to drug [facility]) She has recent insomnia due to steroids I recommend she takes higher dose steroids to control her night sweats and pain and we will start her on gabapentin for neuropathic pain that will probably have sedate if effect could balance out her symptoms of insomnia I will reassess next week  Hypercalcemia of malignancy This is due to disease relapse and dehydration I recommend increased dose steroids and IV fluid today and she agreed  Prerenal acute renal failure ([place]) This is due to disease relapse, malignant hypercalcemia and dehydration I recommend IV fluids today and she agreed to proceed  Goals of care, counseling/discussion The patient is aware she has stage IV disease and treatment is strictly palliative. We discussed importance of Advanced Directives and Living will. We discussed CODE STATUS; the patient desires to DNR. Per discussion today, we will enroll her in hospice care   No orders of the defined types were placed in this encounter.  All questions were answered. The patient knows to call the clinic with any problems, questions or concerns. No barriers to learning was detected. I spent 30 minutes counseling the patient face to face. The total time spent in the appointment was 40 minutes and more than 50% was on counseling and review of test results     Heath Lark, MD 03/09/2017 5:27 PM

## 2017-03-09 NOTE — Telephone Encounter (Signed)
Manus Gunning Rinehuls called to say pt is declining. She has not contacted palliative care. She has been pushing people away and she won't let anyone near her.  The pt did tell Manus Gunning she is worse and she is scared. A coworker of pt called Manus Gunning and told her she called out of work yesterday with a cold. Work is her life and she feels if she stops working that is the end. Maybe if Tammi RN calls and touch base with her. For pt NOT to know Manus Gunning called or pt will push Manus Gunning away more. Manus Gunning said 3 times for pt NOT to know she called.  Pt has appt 4/10 lab/[facility]

## 2017-03-09 NOTE — Telephone Encounter (Signed)
Pt to come today at 2:00 lab/Dr Alvy Bimler

## 2017-03-09 NOTE — Assessment & Plan Note (Signed)
The patient is aware she has stage IV disease and treatment is strictly palliative. We discussed importance of Advanced Directives and Living will. We discussed CODE STATUS; the patient desires to DNR. Per discussion today, we will enroll her in hospice care

## 2017-03-12 ENCOUNTER — Telehealth: Payer: Self-pay | Admitting: Medical Oncology

## 2017-03-12 NOTE — Telephone Encounter (Signed)
[facility] called -Pt does not want hospice to schedule visit until after she sees [facility] Tuesday. They will call her on the 11th

## 2017-03-13 ENCOUNTER — Other Ambulatory Visit (HOSPITAL_BASED_OUTPATIENT_CLINIC_OR_DEPARTMENT_OTHER): Payer: 59

## 2017-03-13 ENCOUNTER — Ambulatory Visit (HOSPITAL_BASED_OUTPATIENT_CLINIC_OR_DEPARTMENT_OTHER): Payer: 59 | Admitting: Hematology and Oncology

## 2017-03-13 ENCOUNTER — Encounter: Payer: Self-pay | Admitting: Hematology and Oncology

## 2017-03-13 ENCOUNTER — Telehealth: Payer: Self-pay | Admitting: Hematology and Oncology

## 2017-03-13 DIAGNOSIS — C9000 Multiple myeloma not having achieved remission: Secondary | ICD-10-CM

## 2017-03-13 DIAGNOSIS — N179 Acute kidney failure, unspecified: Secondary | ICD-10-CM | POA: Diagnosis not present

## 2017-03-13 DIAGNOSIS — G893 Neoplasm related pain (acute) (chronic): Secondary | ICD-10-CM | POA: Diagnosis not present

## 2017-03-13 LAB — COMPREHENSIVE METABOLIC PANEL
ALBUMIN: 4 g/dL (ref 3.5–5.0)
ALK PHOS: 51 U/L (ref 40–150)
ALT: 21 U/L (ref 0–55)
AST: 15 U/L (ref 5–34)
Anion Gap: 12 mEq/L — ABNORMAL HIGH (ref 3–11)
BUN: 22.5 mg/dL (ref 7.0–26.0)
CO2: 25 meq/L (ref 22–29)
Calcium: 9.7 mg/dL (ref 8.4–10.4)
Chloride: 105 mEq/L (ref 98–109)
Creatinine: 1.3 mg/dL — ABNORMAL HIGH (ref 0.6–1.1)
EGFR: 45 mL/min/{1.73_m2} — ABNORMAL LOW (ref >90)
GLUCOSE: 173 mg/dL — AB (ref 70–140)
POTASSIUM: 3.7 meq/L (ref 3.5–5.1)
Sodium: 141 mEq/L (ref 136–145)
Total Bilirubin: 0.3 mg/dL (ref 0.20–1.20)
Total Protein: 7.7 g/dL (ref 6.4–8.3)

## 2017-03-13 LAB — CBC WITH DIFFERENTIAL/PLATELET
BASO%: 0.3 % (ref 0.0–2.0)
Basophils Absolute: 0 10*3/uL (ref 0.0–0.1)
EOS%: 0 % (ref 0.0–7.0)
Eosinophils Absolute: 0 10*3/uL (ref 0.0–0.5)
HCT: 33.4 % — ABNORMAL LOW (ref 34.8–46.6)
HEMOGLOBIN: 11.2 g/dL — AB (ref 11.6–15.9)
LYMPH#: 0.8 10*3/uL — AB (ref 0.9–3.3)
LYMPH%: 10.9 % — ABNORMAL LOW (ref 14.0–49.7)
MCH: 32.8 pg (ref 25.1–34.0)
MCHC: 33.5 g/dL (ref 31.5–36.0)
MCV: 97.9 fL (ref 79.5–101.0)
MONO#: 0.3 10*3/uL (ref 0.1–0.9)
MONO%: 3.5 % (ref 0.0–14.0)
NEUT%: 85.3 % — ABNORMAL HIGH (ref 38.4–76.8)
NEUTROS ABS: 6.3 10*3/uL (ref 1.5–6.5)
Platelets: 159 10*3/uL (ref 145–400)
RBC: 3.41 10*6/uL — AB (ref 3.70–5.45)
RDW: 13.3 % (ref 11.2–14.5)
WBC: 7.4 10*3/uL (ref 3.9–10.3)

## 2017-03-13 LAB — TECHNOLOGIST REVIEW

## 2017-03-13 MED ORDER — GABAPENTIN 100 MG PO CAPS: 200.0000 mg | ORAL_CAPSULE | Freq: Two times a day (BID) | ORAL | 1 refills | Status: AC

## 2017-03-13 MED ORDER — DEXAMETHASONE 4 MG PO TABS: 4.0000 mg | ORAL_TABLET | Freq: Every day | ORAL | 1 refills | Status: DC

## 2017-03-13 NOTE — Assessment & Plan Note (Signed)
She has recent hypercalcemia and renal failure due to disease progression With high-dose dexamethasone, hypercalcemia has resolved I recommend she continue on 4 mg dexamethasone for now

## 2017-03-13 NOTE — Assessment & Plan Note (Signed)
She has profound signs and symptoms of disease relapse with profuse night sweats, increased pain and hypercalcemia We discussed goals of care The patient is comfortable with supportive care only. We discussed palliative care/hospice consult and she agreed to proceed We will consult [facility] With recent increased dose dexamethasone, she has good symptomatic control I recommend we continue high-dose dexamethasone at 4 mg daily for now and I plan to see her back in a month again for further review and supportive care

## 2017-03-13 NOTE — Telephone Encounter (Signed)
Gave patient AVS and calender per 4/10 los.  

## 2017-03-13 NOTE — Assessment & Plan Note (Signed)
This is due to disease relapse, malignant hypercalcemia and dehydration After recent IV fluids and increased dose dexamethasone, renal failure has improved I continue to encourage her to increase oral liquid intake as tolerated.

## 2017-03-13 NOTE — Progress Notes (Signed)
[place] OFFICE PROGRESS NOTE  Patient Care Team: Hulan Fess, MD as PCP - General (Family Medicine) Luanne Bras, MD as Consulting Physician (Interventional Radiology) Webb Laws, OD as Consulting Physician (Optometry)  SUMMARY OF ONCOLOGIC HISTORY: Oncology History   Multiple myeloma without remission; Calcium 13.3, Creatinine 2.83, Hg 8.3, Bone lesions present, Beta 2 microglobulin 12.8, Albumin 3.5, IgA 1760 and kappa light chains 1670. Bone marrow biopsy showed 82% involvement.   Primary site: Multiple Myeloma   Staging method: AJCC 6th Edition   Clinical: Stage IIIB signed by Heath Lark, MD on 07/13/2014 12:38 PM   Summary: Stage IIIB       Multiple myeloma without remission ([place])   07/07/2014 - 07/10/2014 Hospital Admission    She was admitted to the hospital for management of malignant hypercalcemia and had bone marrow biopsy which confirmed diagnosis of multiple myeloma. The patient received one unit of blood transfusion due to severe anemia.      07/08/2014 Bone Marrow Biopsy    The patient had bone marrow biopsy that confirmed diagnosis.      07/08/2014 Pathology Results    Accession: [id number] showed 82% involvement of bone marrow.      07/08/2014 - 10/19/2014 Chemotherapy    She was started on Velcade along with dexamethasone.      08/21/2014 - 10/19/2014 Chemotherapy    I added Revlimid and Zometa to her chemotherapy regimen.      09/24/2014 Pathology Results    Accession: [id number] biopsy showed plasma cells involvement.      09/24/2014 Procedure    she has kyphoplasty performed.      09/30/2014 Bone Marrow Biopsy    Accession: [id number] bone marrow biopsy showed residual plasma cell, approximately 11%      11/06/2014 - 05/07/2015 Chemotherapy    She was started on Velcade maintenance every other week, treatment is discontinued due to neuropathic pain.      05/14/2015 - 07/02/2015 Chemotherapy    The patient received Revlimid  and start taking it as maintenance therapy. Treatment was discontinued due to side-effects      12/10/2015 -  Chemotherapy    She resumed treatment due to disease relapse with Revlimid, dexamethasone and Ninlaro      01/07/2016 Adverse Reaction    Treatment was placed on hold due to fatigue and diarrhea. She subsequently resumed treatment at reduced dose       INTERVAL HISTORY: Please see below for problem oriented charting. She is seen for further follow-up. Since the last time I saw her, she felt much better.  With increased dose dexamethasone, night sweats is drastically reduced. Her pain control is better with high-dose dexamethasone and the addition of gabapentin.  She slept better.  She is thinking about quitting her job She has gained some weight since the last time I saw her  REVIEW OF SYSTEMS:   Constitutional: Denies fevers, chills or abnormal weight loss Eyes: Denies blurriness of vision Ears, nose, mouth, throat, and face: Denies mucositis or sore throat Respiratory: Denies cough, dyspnea or wheezes Cardiovascular: Denies palpitation, chest discomfort or lower extremity swelling Gastrointestinal:  Denies nausea, heartburn or change in bowel habits Skin: Denies abnormal skin rashes Lymphatics: Denies new lymphadenopathy or easy bruising Neurological:Denies numbness, tingling or new weaknesses Behavioral/Psych: Mood is stable, no new changes  All other systems were reviewed with the patient and are negative.  I have reviewed the past medical history, past surgical history, social history and family history with the patient  and they are unchanged from previous note.  ALLERGIES:  is allergic to hydrocodone; ace inhibitors; and cephalexin.  MEDICATIONS:  Current Outpatient Prescriptions  Medication Sig Dispense Refill  . acetaminophen (TYLENOL) 500 MG tablet Take 1,000 mg by mouth every 6 (six) hours as needed for mild pain.    Marland Kitchen acyclovir (ZOVIRAX) 400 MG tablet TAKE 1  TABLET BY MOUTH EVERY DAY 30 tablet 5  . amLODipine (NORVASC) 10 MG tablet Take 1 tablet (10 mg total) by mouth daily. 90 tablet 11  . butalbital-acetaminophen-caffeine (FIORICET, ESGIC) 50-325-40 MG tablet TAKE 1 TABLET BY MOUTH EVERY 6 HOURS AS NEEDED FOR HEADACHE 90 tablet 0  . cholecalciferol (VITAMIN D) 1000 UNITS tablet Take 2,000 Units by mouth every morning.     . [employer] SENNA PLUS 8.6-50 MG per tablet TAKE 2 TABLETS BY MOUTH TWICE A DAY 60 tablet 1  . dexamethasone (DECADRON) 4 MG tablet Take 1 tablet (4 mg total) by mouth daily. 60 tablet 1  . famotidine (PEPCID) 20 MG tablet Take 20 mg by mouth daily.    Marland Kitchen gabapentin (NEURONTIN) 300 MG capsule Take 1 capsule (300 mg total) by mouth 2 (two) times daily. 60 capsule 9  . HYDROmorphone (DILAUDID) 8 MG tablet Take 1 tablet (8 mg total) by mouth every 6 (six) hours as needed for severe pain. 90 tablet 0  . lidocaine-prilocaine (EMLA) cream Apply 1 application topically as needed. 30 g 6  . morphine (MS CONTIN) 15 MG 12 hr tablet Take 1 tablet (15 mg total) by mouth every 12 (twelve) hours. 60 tablet 0  . ondansetron (ZOFRAN) 4 MG tablet Take 1 tablet (4 mg total) by mouth every 8 (eight) hours as needed for nausea. 90 tablet 3  . polyethylene glycol (MIRALAX / GLYCOLAX) packet Take 17 g by mouth daily.    . polyvinyl alcohol (LIQUIFILM TEARS) 1.4 % ophthalmic solution Place 1 drop into both eyes as needed for dry eyes.     Marland Kitchen gabapentin (NEURONTIN) 100 MG capsule Take 2 capsules (200 mg total) by mouth 2 (two) times daily. 90 capsule 1   No current facility-administered medications for this visit.     PHYSICAL EXAMINATION: ECOG PERFORMANCE STATUS: 1 - Symptomatic but completely ambulatory  Vitals:   03/13/17 1436  BP: 134/65  Pulse: (!) 104  Resp: 18  Temp: 98.1 F (36.7 C)   Filed Weights   03/13/17 1436  Weight: 107 lb 1.6 oz (48.6 kg)    GENERAL:alert, no distress and comfortable.  She looks thin and mildly cachectic SKIN:  skin color, texture, turgor are normal, no rashes or significant lesions EYES: normal, Conjunctiva are pink and non-injected, sclera clear Musculoskeletal:no cyanosis of digits and no clubbing  NEURO: alert & oriented x 3 with fluent speech, no focal motor/sensory deficits  LABORATORY DATA:  I have reviewed the data as listed    Component Value Date/Time   NA 141 03/13/2017 1421   K 3.7 03/13/2017 1421   CL 105 09/24/2014 0722   CO2 25 03/13/2017 1421   GLUCOSE 173 (H) 03/13/2017 1421   BUN 22.5 03/13/2017 1421   CREATININE 1.3 (H) 03/13/2017 1421   CALCIUM 9.7 03/13/2017 1421   PROT 7.7 03/13/2017 1421   ALBUMIN 4.0 03/13/2017 1421   AST 15 03/13/2017 1421   ALT 21 03/13/2017 1421   ALKPHOS 51 03/13/2017 1421   BILITOT 0.30 03/13/2017 1421   GFRNONAA 47 (L) 09/24/2014 0722   GFRAA 54 (L) 09/24/2014 0093  No results found for: SPEP, UPEP  Lab Results  Component Value Date   WBC 7.4 03/13/2017   NEUTROABS 6.3 03/13/2017   HGB 11.2 (L) 03/13/2017   HCT 33.4 (L) 03/13/2017   MCV 97.9 03/13/2017   PLT 159 03/13/2017      Chemistry      Component Value Date/Time   NA 141 03/13/2017 1421   K 3.7 03/13/2017 1421   CL 105 09/24/2014 0722   CO2 25 03/13/2017 1421   BUN 22.5 03/13/2017 1421   CREATININE 1.3 (H) 03/13/2017 1421      Component Value Date/Time   CALCIUM 9.7 03/13/2017 1421   ALKPHOS 51 03/13/2017 1421   AST 15 03/13/2017 1421   ALT 21 03/13/2017 1421   BILITOT 0.30 03/13/2017 1421       ASSESSMENT & PLAN:  Multiple myeloma without remission She has profound signs and symptoms of disease relapse with profuse night sweats, increased pain and hypercalcemia We discussed goals of care The patient is comfortable with supportive care only. We discussed palliative care/hospice consult and she agreed to proceed We will consult [facility] With recent increased dose dexamethasone, she has good symptomatic control I recommend we continue high-dose  dexamethasone at 4 mg daily for now and I plan to see her back in a month again for further review and supportive care  Cancer associated pain She has worsening cancer pain control due to disease progression I recommend increasing the dose of dexamethasone to 4 mg daily along with gabapentin Pain control is much improved. She will continue the same regimen for now  Hypercalcemia of malignancy She has recent hypercalcemia and renal failure due to disease progression With high-dose dexamethasone, hypercalcemia has resolved I recommend she continue on 4 mg dexamethasone for now  Prerenal acute renal failure ([place]) This is due to disease relapse, malignant hypercalcemia and dehydration After recent IV fluids and increased dose dexamethasone, renal failure has improved I continue to encourage her to increase oral liquid intake as tolerated.   No orders of the defined types were placed in this encounter.  All questions were answered. The patient knows to call the clinic with any problems, questions or concerns. No barriers to learning was detected. I spent 15 minutes counseling the patient face to face. The total time spent in the appointment was 20 minutes and more than 50% was on counseling and review of test results     Heath Lark, MD 03/13/2017 4:17 PM

## 2017-03-13 NOTE — Assessment & Plan Note (Signed)
She has worsening cancer pain control due to disease progression I recommend increasing the dose of dexamethasone to 4 mg daily along with gabapentin Pain control is much improved. She will continue the same regimen for now

## 2017-04-13 ENCOUNTER — Other Ambulatory Visit: Payer: 59

## 2017-04-13 ENCOUNTER — Ambulatory Visit: Payer: 59 | Admitting: Hematology and Oncology

## 2017-05-01 ENCOUNTER — Other Ambulatory Visit: Payer: Self-pay | Admitting: Hematology and Oncology

## 2017-05-01 ENCOUNTER — Telehealth: Payer: Self-pay

## 2017-05-01 DIAGNOSIS — C9000 Multiple myeloma not having achieved remission: Secondary | ICD-10-CM

## 2017-05-01 MED ORDER — HYDROMORPHONE HCL 8 MG PO TABS: 8.0000 mg | ORAL_TABLET | Freq: Four times a day (QID) | ORAL | 0 refills | Status: DC | PRN

## 2017-05-01 NOTE — Telephone Encounter (Signed)
Prescription ready for pickup Is she still taking dexamethasone? I would like her to continue taking that for now

## 2017-05-01 NOTE — Telephone Encounter (Signed)
Called and left below message. Instructed to call for questions. 

## 2017-05-01 NOTE — Telephone Encounter (Signed)
Patient called and left message. She needs a refill on her Dilaudid and wants to pick it up tomorrow. She needs to reschedule her appt with Dr. Alvy Bimler with labs, she canceled recently because she converted to [employer].

## 2017-05-07 ENCOUNTER — Telehealth: Payer: Self-pay | Admitting: Hematology and Oncology

## 2017-05-07 NOTE — Telephone Encounter (Signed)
Patient called to reschedule her appointment for 5/11 due to change in insurance. Please let me know when to schedule her and I will get it done and notify the patient

## 2017-05-07 NOTE — Telephone Encounter (Signed)
6/25, 30 mins in the afternoon

## 2017-05-15 ENCOUNTER — Telehealth: Payer: Self-pay | Admitting: Hematology and Oncology

## 2017-05-15 NOTE — Telephone Encounter (Signed)
lvm to inform pt of 6/25 appt at 4 pm per sch msg

## 2017-05-28 ENCOUNTER — Ambulatory Visit (HOSPITAL_BASED_OUTPATIENT_CLINIC_OR_DEPARTMENT_OTHER): Payer: 59 | Admitting: Hematology and Oncology

## 2017-05-28 ENCOUNTER — Other Ambulatory Visit (HOSPITAL_BASED_OUTPATIENT_CLINIC_OR_DEPARTMENT_OTHER): Payer: 59

## 2017-05-28 ENCOUNTER — Encounter: Payer: Self-pay | Admitting: Hematology and Oncology

## 2017-05-28 DIAGNOSIS — Z7189 Other specified counseling: Secondary | ICD-10-CM

## 2017-05-28 DIAGNOSIS — R748 Abnormal levels of other serum enzymes: Secondary | ICD-10-CM

## 2017-05-28 DIAGNOSIS — G893 Neoplasm related pain (acute) (chronic): Secondary | ICD-10-CM

## 2017-05-28 DIAGNOSIS — E875 Hyperkalemia: Secondary | ICD-10-CM | POA: Diagnosis not present

## 2017-05-28 DIAGNOSIS — C9000 Multiple myeloma not having achieved remission: Secondary | ICD-10-CM

## 2017-05-28 LAB — COMPREHENSIVE METABOLIC PANEL
ALT: 70 U/L — AB (ref 0–55)
ANION GAP: 12 meq/L — AB (ref 3–11)
AST: 25 U/L (ref 5–34)
Albumin: 3.3 g/dL — ABNORMAL LOW (ref 3.5–5.0)
Alkaline Phosphatase: 77 U/L (ref 40–150)
BILIRUBIN TOTAL: 0.35 mg/dL (ref 0.20–1.20)
BUN: 39.1 mg/dL — AB (ref 7.0–26.0)
CALCIUM: 10.7 mg/dL — AB (ref 8.4–10.4)
CHLORIDE: 103 meq/L (ref 98–109)
CO2: 25 mEq/L (ref 22–29)
CREATININE: 1.4 mg/dL — AB (ref 0.6–1.1)
EGFR: 40 mL/min/{1.73_m2} — ABNORMAL LOW (ref >90)
Glucose: 101 mg/dl (ref 70–140)
Potassium: 5.2 mEq/L — ABNORMAL HIGH (ref 3.5–5.1)
Sodium: 140 mEq/L (ref 136–145)
TOTAL PROTEIN: 7.4 g/dL (ref 6.4–8.3)

## 2017-05-28 LAB — CBC WITH DIFFERENTIAL/PLATELET
BASO%: 0.4 % (ref 0.0–2.0)
Basophils Absolute: 0 10*3/uL (ref 0.0–0.1)
EOS%: 0 % (ref 0.0–7.0)
Eosinophils Absolute: 0 10*3/uL (ref 0.0–0.5)
HEMATOCRIT: 35.5 % (ref 34.8–46.6)
HGB: 11.9 g/dL (ref 11.6–15.9)
LYMPH#: 0.5 10*3/uL — AB (ref 0.9–3.3)
LYMPH%: 5.1 % — ABNORMAL LOW (ref 14.0–49.7)
MCH: 35 pg — ABNORMAL HIGH (ref 25.1–34.0)
MCHC: 33.5 g/dL (ref 31.5–36.0)
MCV: 104.4 fL — ABNORMAL HIGH (ref 79.5–101.0)
MONO#: 0.4 10*3/uL (ref 0.1–0.9)
MONO%: 3.9 % (ref 0.0–14.0)
NEUT%: 90.6 % — AB (ref 38.4–76.8)
NEUTROS ABS: 9.4 10*3/uL — AB (ref 1.5–6.5)
PLATELETS: 239 10*3/uL (ref 145–400)
RBC: 3.4 10*6/uL — AB (ref 3.70–5.45)
RDW: 14.9 % — ABNORMAL HIGH (ref 11.2–14.5)
WBC: 10.3 10*3/uL (ref 3.9–10.3)

## 2017-05-28 LAB — TECHNOLOGIST REVIEW

## 2017-05-28 MED ORDER — HYDROMORPHONE HCL 8 MG PO TABS: 8.0000 mg | ORAL_TABLET | Freq: Four times a day (QID) | ORAL | 0 refills | Status: AC | PRN

## 2017-05-28 NOTE — Assessment & Plan Note (Signed)
The elevated liver enzymes are likely related to her disease and hypercalcemia Continue observation only

## 2017-05-28 NOTE — Assessment & Plan Note (Signed)
The patient have signs and symptoms of disease progression with progressive hypercalcemia and abnormal renal function  The patient is comfortable with supportive care only. We discussed palliative care/hospice consult and she agreed to proceed I recommend she makes contact with [place] [facility] to establish care I recommend we continue high-dose dexamethasone at 4 mg daily  I will discontinue follow-up in the outpatient clinic so that she can focus on palliative care at home along with hospice care

## 2017-05-28 NOTE — Assessment & Plan Note (Signed)
The patient is aware she has stage IV disease and treatment is strictly palliative. We discussed importance of Advanced Directives and Living will. We discussed CODE STATUS; the patient desires to DNR. I have given her a copy of the MOST form I recommend the patient establish contact with hospice

## 2017-05-28 NOTE — Assessment & Plan Note (Signed)
She has cancer pain control due to disease progression I recommend continue dexamethasone 4 mg daily along with Dilaudid From our previous visit, I also recommend gabapentin but the patient felt that gabapentin causes confusion She will continue the same regimen for now

## 2017-05-28 NOTE — Assessment & Plan Note (Signed)
She has recent hypercalcemia and renal failure due to disease progression I recommend she continue on 4 mg dexamethasone for now

## 2017-05-28 NOTE — Progress Notes (Signed)
Will OFFICE PROGRESS NOTE  Patient Care Team: Hulan Fess, MD as PCP - General (Family Medicine) Luanne Bras, MD as Consulting Physician (Interventional Radiology) Webb Laws, OD as Consulting Physician (Optometry)  SUMMARY OF ONCOLOGIC HISTORY: Oncology History   Multiple myeloma without remission; Calcium 13.3, Creatinine 2.83, Hg 8.3, Bone lesions present, Beta 2 microglobulin 12.8, Albumin 3.5, IgA 1760 and kappa light chains 1670. Bone marrow biopsy showed 82% involvement.   Primary site: Multiple Myeloma   Staging method: AJCC 6th Edition   Clinical: Stage IIIB signed by Heath Lark, MD on 07/13/2014 12:38 PM   Summary: Stage IIIB       Multiple myeloma without remission ([place])   07/07/2014 - 07/10/2014 Hospital Admission    She was admitted to the hospital for management of malignant hypercalcemia and had bone marrow biopsy which confirmed diagnosis of multiple myeloma. The patient received one unit of blood transfusion due to severe anemia.      07/08/2014 Bone Marrow Biopsy    The patient had bone marrow biopsy that confirmed diagnosis.      07/08/2014 Pathology Results    Accession: [id number] showed 82% involvement of bone marrow.      07/08/2014 - 10/19/2014 Chemotherapy    She was started on Velcade along with dexamethasone.      08/21/2014 - 10/19/2014 Chemotherapy    I added Revlimid and Zometa to her chemotherapy regimen.      09/24/2014 Pathology Results    Accession: [id number] biopsy showed plasma cells involvement.      09/24/2014 Procedure    she has kyphoplasty performed.      09/30/2014 Bone Marrow Biopsy    Accession: [id number] bone marrow biopsy showed residual plasma cell, approximately 11%      11/06/2014 - 05/07/2015 Chemotherapy    She was started on Velcade maintenance every other week, treatment is discontinued due to neuropathic pain.      05/14/2015 - 07/02/2015 Chemotherapy    The patient received Revlimid  and start taking it as maintenance therapy. Treatment was discontinued due to side-effects      12/10/2015 -  Chemotherapy    She resumed treatment due to disease relapse with Revlimid, dexamethasone and Ninlaro      01/07/2016 Adverse Reaction    Treatment was placed on hold due to fatigue and diarrhea. She subsequently resumed treatment at reduced dose       INTERVAL HISTORY: Please see below for problem oriented charting. She returns for further follow-up She has retired since end of April 2018 At that time, she began to have some symptoms of hoarseness She was treated with 10 days course of antibiotics She continues to have intermittent hoarseness of voice that comes and goes She felt weak and has been sleeping a lot She felt that the gabapentin that was prescribed several months ago was hot causing some unpleasant side effects She subsequently self discontinued gabapentin With daily 4 milligrams of dexamethasone, she denies further night sweats Her appetite remained poor and she eats very little She denies recent falls No recent new bone pain  REVIEW OF SYSTEMS:   Constitutional: Denies fevers, chills or abnormal weight loss Eyes: Denies blurriness of vision Ears, nose, mouth, throat, and face: Denies mucositis or sore throat Respiratory: Denies cough, dyspnea or wheezes Cardiovascular: Denies palpitation, chest discomfort or lower extremity swelling Gastrointestinal:  Denies nausea, heartburn or change in bowel habits Skin: Denies abnormal skin rashes Lymphatics: Denies new lymphadenopathy or easy bruising Behavioral/Psych: Mood  is stable, no new changes  All other systems were reviewed with the patient and are negative.  I have reviewed the past medical history, past surgical history, social history and family history with the patient and they are unchanged from previous note.  ALLERGIES:  is allergic to hydrocodone; ace inhibitors; and cephalexin.  MEDICATIONS:   Current Outpatient Prescriptions  Medication Sig Dispense Refill  . acetaminophen (TYLENOL) 500 MG tablet Take 1,000 mg by mouth every 6 (six) hours as needed for mild pain.    Marland Kitchen acyclovir (ZOVIRAX) 400 MG tablet TAKE 1 TABLET BY MOUTH EVERY DAY 30 tablet 5  . amLODipine (NORVASC) 10 MG tablet Take 1 tablet (10 mg total) by mouth daily. 90 tablet 11  . butalbital-acetaminophen-caffeine (FIORICET, ESGIC) 50-325-40 MG tablet TAKE 1 TABLET BY MOUTH EVERY 6 HOURS AS NEEDED FOR HEADACHE 90 tablet 0  . cholecalciferol (VITAMIN D) 1000 UNITS tablet Take 2,000 Units by mouth every morning.     . [employer] SENNA PLUS 8.6-50 MG per tablet TAKE 2 TABLETS BY MOUTH TWICE A DAY 60 tablet 1  . dexamethasone (DECADRON) 4 MG tablet Take 1 tablet (4 mg total) by mouth daily. 60 tablet 1  . famotidine (PEPCID) 20 MG tablet Take 20 mg by mouth daily.    Marland Kitchen gabapentin (NEURONTIN) 100 MG capsule Take 2 capsules (200 mg total) by mouth 2 (two) times daily. 90 capsule 1  . gabapentin (NEURONTIN) 300 MG capsule Take 1 capsule (300 mg total) by mouth 2 (two) times daily. 60 capsule 9  . HYDROmorphone (DILAUDID) 8 MG tablet Take 1 tablet (8 mg total) by mouth every 6 (six) hours as needed for severe pain. 90 tablet 0  . lidocaine-prilocaine (EMLA) cream Apply 1 application topically as needed. 30 g 6  . morphine (MS CONTIN) 15 MG 12 hr tablet Take 1 tablet (15 mg total) by mouth every 12 (twelve) hours. 60 tablet 0  . ondansetron (ZOFRAN) 4 MG tablet Take 1 tablet (4 mg total) by mouth every 8 (eight) hours as needed for nausea. 90 tablet 3  . polyethylene glycol (MIRALAX / GLYCOLAX) packet Take 17 g by mouth daily.    . polyvinyl alcohol (LIQUIFILM TEARS) 1.4 % ophthalmic solution Place 1 drop into both eyes as needed for dry eyes.      No current facility-administered medications for this visit.     PHYSICAL EXAMINATION: ECOG PERFORMANCE STATUS: 3 - Symptomatic, >50% confined to bed  Vitals:   05/28/17 1318  BP: 129/74   Pulse: 72  Resp: 18  Temp: 97.9 F (36.6 C)   Filed Weights   05/28/17 1318  Weight: 103 lb 6.4 oz (46.9 kg)    GENERAL:alert, no distress and comfortable.  She looks thin and cachectic SKIN: skin color, texture, turgor are normal, no rashes or significant lesions EYES: normal, Conjunctiva are pink and non-injected, sclera clear OROPHARYNX:no exudate, no erythema and lips, buccal mucosa, and tongue normal  NECK: supple, thyroid normal size, non-tender, without nodularity LYMPH:  no palpable lymphadenopathy in the cervical, axillary or inguinal LUNGS: clear to auscultation and percussion with normal breathing effort HEART: regular rate & rhythm and no murmurs and no lower extremity edema ABDOMEN:abdomen soft, non-tender and normal bowel sounds Musculoskeletal:no cyanosis of digits and no clubbing  NEURO: alert & oriented x 3 with fluent speech, no focal motor/sensory deficits  LABORATORY DATA:  I have reviewed the data as listed    Component Value Date/Time   NA 140 05/28/2017 1301  K 5.2 (H) 05/28/2017 1301   CL 105 09/24/2014 0722   CO2 25 05/28/2017 1301   GLUCOSE 101 05/28/2017 1301   BUN 39.1 (H) 05/28/2017 1301   CREATININE 1.4 (H) 05/28/2017 1301   CALCIUM 10.7 (H) 05/28/2017 1301   PROT 7.4 05/28/2017 1301   ALBUMIN 3.3 (L) 05/28/2017 1301   AST 25 05/28/2017 1301   ALT 70 (H) 05/28/2017 1301   ALKPHOS 77 05/28/2017 1301   BILITOT 0.35 05/28/2017 1301   GFRNONAA 47 (L) 09/24/2014 0722   GFRAA 54 (L) 09/24/2014 0722    No results found for: SPEP, UPEP  Lab Results  Component Value Date   WBC 10.3 05/28/2017   NEUTROABS 9.4 (H) 05/28/2017   HGB 11.9 05/28/2017   HCT 35.5 05/28/2017   MCV 104.4 (H) 05/28/2017   PLT 239 05/28/2017      Chemistry      Component Value Date/Time   NA 140 05/28/2017 1301   K 5.2 (H) 05/28/2017 1301   CL 105 09/24/2014 0722   CO2 25 05/28/2017 1301   BUN 39.1 (H) 05/28/2017 1301   CREATININE 1.4 (H) 05/28/2017 1301       Component Value Date/Time   CALCIUM 10.7 (H) 05/28/2017 1301   ALKPHOS 77 05/28/2017 1301   AST 25 05/28/2017 1301   ALT 70 (H) 05/28/2017 1301   BILITOT 0.35 05/28/2017 1301       ASSESSMENT & PLAN:  Multiple myeloma without remission The patient have signs and symptoms of disease progression with progressive hypercalcemia and abnormal renal function  The patient is comfortable with supportive care only. We discussed palliative care/hospice consult and she agreed to proceed I recommend she makes contact with [place] [facility] to establish care I recommend we continue high-dose dexamethasone at 4 mg daily  I will discontinue follow-up in the outpatient clinic so that she can focus on palliative care at home along with hospice care  Hypercalcemia of malignancy She has recent hypercalcemia and renal failure due to disease progression I recommend she continue on 4 mg dexamethasone for now  Cancer associated pain She has cancer pain control due to disease progression I recommend continue dexamethasone 4 mg daily along with Dilaudid From our previous visit, I also recommend gabapentin but the patient felt that gabapentin causes confusion She will continue the same regimen for now  Goals of care, counseling/discussion The patient is aware she has stage IV disease and treatment is strictly palliative. We discussed importance of Advanced Directives and Living will. We discussed CODE STATUS; the patient desires to DNR. I have given her a copy of the MOST form I recommend the patient establish contact with hospice  Hyperkalemia The hyperkalemia is likely due to high cell turnover Recommend observation only  Elevated liver enzymes The elevated liver enzymes are likely related to her disease and hypercalcemia Continue observation only   No orders of the defined types were placed in this encounter.  All questions were answered. The patient knows to call the clinic with any  problems, questions or concerns. No barriers to learning was detected. I spent 20 minutes counseling the patient face to face. The total time spent in the appointment was 25 minutes and more than 50% was on counseling and review of test results     Heath Lark, MD 05/28/2017 2:46 PM

## 2017-05-28 NOTE — Assessment & Plan Note (Signed)
The hyperkalemia is likely due to high cell turnover Recommend observation only

## 2017-06-21 ENCOUNTER — Other Ambulatory Visit: Payer: Self-pay | Admitting: Hematology and Oncology

## 2017-07-04 ENCOUNTER — Other Ambulatory Visit: Payer: Self-pay | Admitting: Hematology and Oncology

## 2017-07-06 ENCOUNTER — Other Ambulatory Visit: Payer: Self-pay | Admitting: Hematology and Oncology

## 2017-07-27 ENCOUNTER — Telehealth: Payer: Self-pay | Admitting: Medical Oncology

## 2017-07-27 NOTE — Telephone Encounter (Signed)
Pt now under hospice services.

## 2017-08-03 ENCOUNTER — Telehealth: Payer: Self-pay

## 2017-08-03 NOTE — Telephone Encounter (Signed)
Pt returned call that she is doing well. She has family visiting this weekend. She has met most of her hospice team and she is very pleased with them. She appreciates Dr Alvy Bimler calling in the [facility] hospice team.

## 2017-10-16 ENCOUNTER — Other Ambulatory Visit: Payer: Self-pay | Admitting: Hematology and Oncology

## 2018-01-17 ENCOUNTER — Other Ambulatory Visit: Payer: Self-pay | Admitting: Hematology and Oncology

## 2018-01-29 ENCOUNTER — Other Ambulatory Visit: Payer: Self-pay | Admitting: Hematology and Oncology

## 2018-02-25 ENCOUNTER — Telehealth: Payer: Self-pay

## 2018-02-25 NOTE — Telephone Encounter (Signed)
Received call from [facility]  [phone], informing Dr Alvy Bimler that patient has passed away.

## 2023-09-17 ENCOUNTER — Other Ambulatory Visit: Payer: Self-pay | Admitting: Pulmonary Disease

## 2023-09-17 DIAGNOSIS — Z87891 Personal history of nicotine dependence: Secondary | ICD-10-CM

## 2023-09-26 ENCOUNTER — Ambulatory Visit: Payer: Medicare HMO
